# Patient Record
Sex: Female | Born: 1955 | Race: White | Hispanic: No | Smoking: Former smoker
Health system: Southern US, Community
[De-identification: ages and names within clinical notes are randomized; demographics above are authoritative.]

## PROBLEM LIST (undated history)

## (undated) DIAGNOSIS — J4520 Mild intermittent asthma, uncomplicated: Secondary | ICD-10-CM

## (undated) DIAGNOSIS — Z8639 Personal history of other endocrine, nutritional and metabolic disease: Secondary | ICD-10-CM

## (undated) DIAGNOSIS — E785 Hyperlipidemia, unspecified: Secondary | ICD-10-CM

## (undated) DIAGNOSIS — F329 Major depressive disorder, single episode, unspecified: Secondary | ICD-10-CM

## (undated) DIAGNOSIS — E669 Obesity, unspecified: Secondary | ICD-10-CM

## (undated) DIAGNOSIS — H269 Unspecified cataract: Secondary | ICD-10-CM

## (undated) DIAGNOSIS — Z973 Presence of spectacles and contact lenses: Secondary | ICD-10-CM

## (undated) DIAGNOSIS — D069 Carcinoma in situ of cervix, unspecified: Secondary | ICD-10-CM

## (undated) DIAGNOSIS — M1990 Unspecified osteoarthritis, unspecified site: Secondary | ICD-10-CM

## (undated) DIAGNOSIS — G4730 Sleep apnea, unspecified: Secondary | ICD-10-CM

## (undated) DIAGNOSIS — D509 Iron deficiency anemia, unspecified: Secondary | ICD-10-CM

## (undated) DIAGNOSIS — E119 Type 2 diabetes mellitus without complications: Secondary | ICD-10-CM

## (undated) DIAGNOSIS — I872 Venous insufficiency (chronic) (peripheral): Secondary | ICD-10-CM

## (undated) DIAGNOSIS — F32A Depression, unspecified: Secondary | ICD-10-CM

## (undated) DIAGNOSIS — G4733 Obstructive sleep apnea (adult) (pediatric): Secondary | ICD-10-CM

## (undated) HISTORY — DX: Hyperlipidemia, unspecified: E78.5

## (undated) HISTORY — DX: Type 2 diabetes mellitus without complications: E11.9

## (undated) HISTORY — DX: Venous insufficiency (chronic) (peripheral): I87.2

## (undated) HISTORY — DX: Depression, unspecified: F32.A

## (undated) HISTORY — DX: Sleep apnea, unspecified: G47.30

## (undated) HISTORY — DX: Unspecified osteoarthritis, unspecified site: M19.90

## (undated) HISTORY — DX: Unspecified cataract: H26.9

## (undated) HISTORY — DX: Obstructive sleep apnea (adult) (pediatric): G47.33

## (undated) HISTORY — PX: JOINT REPLACEMENT: SHX530

## (undated) HISTORY — PX: GASTRIC BYPASS: SHX52

## (undated) HISTORY — DX: Major depressive disorder, single episode, unspecified: F32.9

## (undated) HISTORY — DX: Obesity, unspecified: E66.9

---

## 1993-03-25 DIAGNOSIS — G4733 Obstructive sleep apnea (adult) (pediatric): Secondary | ICD-10-CM

## 1993-03-25 HISTORY — DX: Obstructive sleep apnea (adult) (pediatric): G47.33

## 1997-03-25 HISTORY — PX: TOTAL HIP ARTHROPLASTY: SHX124

## 1997-10-13 ENCOUNTER — Inpatient Hospital Stay (HOSPITAL_COMMUNITY): Admission: RE | Admit: 1997-10-13 | Discharge: 1997-10-18 | Payer: Self-pay | Admitting: Specialist

## 1997-10-20 ENCOUNTER — Encounter (HOSPITAL_COMMUNITY): Admission: RE | Admit: 1997-10-20 | Discharge: 1998-01-18 | Payer: Self-pay | Admitting: Specialist

## 1999-02-06 ENCOUNTER — Other Ambulatory Visit: Admission: RE | Admit: 1999-02-06 | Discharge: 1999-02-06 | Payer: Self-pay | Admitting: Family Medicine

## 1999-06-20 ENCOUNTER — Ambulatory Visit: Admission: RE | Admit: 1999-06-20 | Discharge: 1999-06-20 | Payer: Self-pay | Admitting: Internal Medicine

## 2000-02-13 ENCOUNTER — Emergency Department (HOSPITAL_COMMUNITY): Admission: EM | Admit: 2000-02-13 | Discharge: 2000-02-13 | Payer: Self-pay | Admitting: Emergency Medicine

## 2000-02-13 ENCOUNTER — Encounter: Payer: Self-pay | Admitting: Emergency Medicine

## 2001-04-10 ENCOUNTER — Other Ambulatory Visit: Admission: RE | Admit: 2001-04-10 | Discharge: 2001-04-10 | Payer: Self-pay | Admitting: Family Medicine

## 2003-04-27 ENCOUNTER — Other Ambulatory Visit: Admission: RE | Admit: 2003-04-27 | Discharge: 2003-04-27 | Payer: Self-pay | Admitting: Family Medicine

## 2003-05-02 ENCOUNTER — Encounter: Admission: RE | Admit: 2003-05-02 | Discharge: 2003-07-31 | Payer: Self-pay | Admitting: Family Medicine

## 2003-09-01 ENCOUNTER — Encounter: Admission: RE | Admit: 2003-09-01 | Discharge: 2003-11-30 | Payer: Self-pay | Admitting: Family Medicine

## 2004-07-31 ENCOUNTER — Ambulatory Visit: Payer: Self-pay | Admitting: Internal Medicine

## 2004-08-09 ENCOUNTER — Ambulatory Visit: Payer: Self-pay | Admitting: Family Medicine

## 2004-08-28 ENCOUNTER — Ambulatory Visit: Payer: Self-pay | Admitting: Internal Medicine

## 2004-11-09 ENCOUNTER — Ambulatory Visit: Payer: Self-pay | Admitting: Family Medicine

## 2004-11-09 ENCOUNTER — Other Ambulatory Visit: Admission: RE | Admit: 2004-11-09 | Discharge: 2004-11-09 | Payer: Self-pay | Admitting: Family Medicine

## 2005-01-07 ENCOUNTER — Ambulatory Visit: Payer: Self-pay | Admitting: Family Medicine

## 2005-04-12 ENCOUNTER — Ambulatory Visit: Payer: Self-pay | Admitting: Family Medicine

## 2005-05-24 ENCOUNTER — Ambulatory Visit: Payer: Self-pay | Admitting: Family Medicine

## 2005-06-14 ENCOUNTER — Encounter: Admission: RE | Admit: 2005-06-14 | Discharge: 2005-09-02 | Payer: Self-pay | Admitting: Surgery

## 2005-06-17 ENCOUNTER — Ambulatory Visit (HOSPITAL_COMMUNITY): Admission: RE | Admit: 2005-06-17 | Discharge: 2005-06-17 | Payer: Self-pay | Admitting: Surgery

## 2005-06-18 ENCOUNTER — Ambulatory Visit (HOSPITAL_COMMUNITY): Admission: RE | Admit: 2005-06-18 | Discharge: 2005-06-18 | Payer: Self-pay | Admitting: Surgery

## 2005-06-26 ENCOUNTER — Emergency Department (HOSPITAL_COMMUNITY): Admission: EM | Admit: 2005-06-26 | Discharge: 2005-06-26 | Payer: Self-pay | Admitting: Family Medicine

## 2005-09-27 ENCOUNTER — Encounter: Admission: RE | Admit: 2005-09-27 | Discharge: 2005-12-26 | Payer: Self-pay | Admitting: Surgery

## 2006-11-05 DIAGNOSIS — M1990 Unspecified osteoarthritis, unspecified site: Secondary | ICD-10-CM | POA: Insufficient documentation

## 2007-10-08 ENCOUNTER — Telehealth: Payer: Self-pay | Admitting: Family Medicine

## 2007-12-10 ENCOUNTER — Encounter: Payer: Self-pay | Admitting: Family Medicine

## 2007-12-22 ENCOUNTER — Ambulatory Visit: Payer: Self-pay | Admitting: Family Medicine

## 2007-12-22 LAB — CONVERTED CEMR LAB
Alkaline Phosphatase: 71 units/L (ref 39–117)
Basophils Absolute: 0 10*3/uL (ref 0.0–0.1)
Bilirubin Urine: NEGATIVE
Bilirubin, Direct: 0.1 mg/dL (ref 0.0–0.3)
Calcium: 8.8 mg/dL (ref 8.4–10.5)
Cholesterol: 238 mg/dL (ref 0–200)
Eosinophils Absolute: 0.2 10*3/uL (ref 0.0–0.7)
GFR calc Af Amer: 97 mL/min
GFR calc non Af Amer: 80 mL/min
Glucose, Urine, Semiquant: NEGATIVE
HCT: 38.4 % (ref 36.0–46.0)
Hemoglobin: 12.8 g/dL (ref 12.0–15.0)
MCHC: 33.4 g/dL (ref 30.0–36.0)
Microalb, Ur: 1.5 mg/dL (ref 0.0–1.9)
Monocytes Absolute: 0.4 10*3/uL (ref 0.1–1.0)
Monocytes Relative: 8.3 % (ref 3.0–12.0)
Neutro Abs: 3.1 10*3/uL (ref 1.4–7.7)
Platelets: 263 10*3/uL (ref 150–400)
Potassium: 4.1 meq/L (ref 3.5–5.1)
Protein, U semiquant: NEGATIVE
RDW: 14.9 % — ABNORMAL HIGH (ref 11.5–14.6)
Sodium: 138 meq/L (ref 135–145)
Total Bilirubin: 0.6 mg/dL (ref 0.3–1.2)
Total CHOL/HDL Ratio: 4.8
Total Protein: 6.4 g/dL (ref 6.0–8.3)
Triglycerides: 114 mg/dL (ref 0–149)
Urobilinogen, UA: 0.2
pH: 5.5

## 2007-12-29 ENCOUNTER — Encounter: Payer: Self-pay | Admitting: Family Medicine

## 2007-12-29 ENCOUNTER — Ambulatory Visit: Payer: Self-pay | Admitting: Family Medicine

## 2007-12-29 ENCOUNTER — Encounter: Payer: Self-pay | Admitting: Internal Medicine

## 2007-12-29 ENCOUNTER — Other Ambulatory Visit: Admission: RE | Admit: 2007-12-29 | Discharge: 2007-12-29 | Payer: Self-pay | Admitting: Family Medicine

## 2007-12-29 DIAGNOSIS — F329 Major depressive disorder, single episode, unspecified: Secondary | ICD-10-CM

## 2007-12-29 DIAGNOSIS — E1110 Type 2 diabetes mellitus with ketoacidosis without coma: Secondary | ICD-10-CM

## 2007-12-29 DIAGNOSIS — E785 Hyperlipidemia, unspecified: Secondary | ICD-10-CM

## 2008-01-12 ENCOUNTER — Ambulatory Visit: Payer: Self-pay | Admitting: Internal Medicine

## 2008-01-19 ENCOUNTER — Ambulatory Visit (HOSPITAL_COMMUNITY): Admission: RE | Admit: 2008-01-19 | Discharge: 2008-01-19 | Payer: Self-pay | Admitting: Surgery

## 2008-02-01 ENCOUNTER — Ambulatory Visit: Payer: Self-pay | Admitting: Family Medicine

## 2008-02-02 ENCOUNTER — Encounter: Admission: RE | Admit: 2008-02-02 | Discharge: 2008-05-02 | Payer: Self-pay | Admitting: Surgery

## 2008-02-08 ENCOUNTER — Telehealth: Payer: Self-pay | Admitting: Family Medicine

## 2008-02-23 ENCOUNTER — Ambulatory Visit: Payer: Self-pay | Admitting: Family Medicine

## 2008-02-23 LAB — CONVERTED CEMR LAB
AST: 20 units/L (ref 0–37)
Albumin: 3.4 g/dL — ABNORMAL LOW (ref 3.5–5.2)
HDL: 41.6 mg/dL (ref >39.0)
LDL Cholesterol: 98 mg/dL (ref 0–99)
Total CHOL/HDL Ratio: 3.8
Total Protein: 6.2 g/dL (ref 6.0–8.3)
Triglycerides: 103 mg/dL (ref 0–149)

## 2008-03-07 ENCOUNTER — Ambulatory Visit: Payer: Self-pay | Admitting: Family Medicine

## 2008-03-07 DIAGNOSIS — I872 Venous insufficiency (chronic) (peripheral): Secondary | ICD-10-CM

## 2008-03-07 LAB — CONVERTED CEMR LAB
CO2: 28 meq/L (ref 19–32)
Calcium: 9.7 mg/dL (ref 8.4–10.5)
Chloride: 108 meq/L (ref 96–112)
GFR calc non Af Amer: 93 mL/min
Sodium: 143 meq/L (ref 135–145)

## 2008-03-11 ENCOUNTER — Telehealth: Payer: Self-pay | Admitting: *Deleted

## 2008-03-25 HISTORY — PX: GASTRIC BYPASS: SHX52

## 2008-04-11 ENCOUNTER — Telehealth: Payer: Self-pay | Admitting: Family Medicine

## 2008-04-20 ENCOUNTER — Ambulatory Visit: Payer: Self-pay | Admitting: Family Medicine

## 2008-04-20 DIAGNOSIS — J45909 Unspecified asthma, uncomplicated: Secondary | ICD-10-CM | POA: Insufficient documentation

## 2008-04-20 DIAGNOSIS — J4520 Mild intermittent asthma, uncomplicated: Secondary | ICD-10-CM | POA: Insufficient documentation

## 2008-04-20 DIAGNOSIS — J069 Acute upper respiratory infection, unspecified: Secondary | ICD-10-CM | POA: Insufficient documentation

## 2008-07-28 ENCOUNTER — Ambulatory Visit: Payer: Self-pay | Admitting: Family Medicine

## 2008-07-28 ENCOUNTER — Encounter: Admission: RE | Admit: 2008-07-28 | Discharge: 2008-10-26 | Payer: Self-pay | Admitting: Surgery

## 2008-08-11 ENCOUNTER — Telehealth: Payer: Self-pay | Admitting: Family Medicine

## 2008-08-15 ENCOUNTER — Inpatient Hospital Stay (HOSPITAL_COMMUNITY): Admission: RE | Admit: 2008-08-15 | Discharge: 2008-08-17 | Payer: Self-pay | Admitting: Surgery

## 2008-08-15 HISTORY — PX: ROUX-EN-Y GASTRIC BYPASS: SHX1104

## 2008-08-16 ENCOUNTER — Ambulatory Visit: Payer: Self-pay | Admitting: Vascular Surgery

## 2008-08-16 ENCOUNTER — Encounter (INDEPENDENT_AMBULATORY_CARE_PROVIDER_SITE_OTHER): Payer: Self-pay | Admitting: Surgery

## 2008-09-16 ENCOUNTER — Ambulatory Visit: Payer: Self-pay | Admitting: Family Medicine

## 2008-10-24 ENCOUNTER — Emergency Department (HOSPITAL_COMMUNITY): Admission: EM | Admit: 2008-10-24 | Discharge: 2008-10-24 | Payer: Self-pay | Admitting: Emergency Medicine

## 2008-11-17 ENCOUNTER — Encounter: Admission: RE | Admit: 2008-11-17 | Discharge: 2009-01-26 | Payer: Self-pay | Admitting: Surgery

## 2008-12-13 ENCOUNTER — Ambulatory Visit: Payer: Self-pay | Admitting: Family Medicine

## 2008-12-13 LAB — CONVERTED CEMR LAB
Basophils Absolute: 0 10*3/uL (ref 0.0–0.1)
Eosinophils Absolute: 0.2 10*3/uL (ref 0.0–0.7)
GFR calc non Af Amer: 93.01 mL/min (ref >60)
Glucose, Bld: 101 mg/dL — ABNORMAL HIGH (ref 70–99)
HCT: 38.7 % (ref 36.0–46.0)
HDL: 39.3 mg/dL (ref >39.00)
Hemoglobin: 13 g/dL (ref 12.0–15.0)
Hgb A1c MFr Bld: 5.7 % (ref 4.6–6.5)
Lymphs Abs: 1.4 10*3/uL (ref 0.7–4.0)
MCHC: 33.7 g/dL (ref 30.0–36.0)
MCV: 92 fL (ref 78.0–100.0)
Monocytes Absolute: 0.4 10*3/uL (ref 0.1–1.0)
Neutro Abs: 2.3 10*3/uL (ref 1.4–7.7)
Platelets: 207 10*3/uL (ref 150.0–400.0)
Potassium: 3.7 meq/L (ref 3.5–5.1)
RDW: 15.8 % — ABNORMAL HIGH (ref 11.5–14.6)
Sodium: 143 meq/L (ref 135–145)
TSH: 1.67 microintl units/mL (ref 0.35–5.50)
Triglycerides: 57 mg/dL (ref 0.0–149.0)
VLDL: 11.4 mg/dL (ref 0.0–40.0)

## 2008-12-20 ENCOUNTER — Ambulatory Visit: Payer: Self-pay | Admitting: Family Medicine

## 2009-01-02 ENCOUNTER — Ambulatory Visit: Payer: Self-pay | Admitting: Family Medicine

## 2009-01-02 ENCOUNTER — Other Ambulatory Visit: Admission: RE | Admit: 2009-01-02 | Discharge: 2009-01-02 | Payer: Self-pay | Admitting: Family Medicine

## 2009-01-02 ENCOUNTER — Encounter: Payer: Self-pay | Admitting: Family Medicine

## 2009-01-02 LAB — HM DIABETES FOOT EXAM

## 2009-01-24 ENCOUNTER — Telehealth: Payer: Self-pay | Admitting: Family Medicine

## 2009-01-26 ENCOUNTER — Ambulatory Visit: Payer: Self-pay | Admitting: Family Medicine

## 2009-02-28 ENCOUNTER — Telehealth: Payer: Self-pay | Admitting: Family Medicine

## 2009-03-13 ENCOUNTER — Telehealth: Payer: Self-pay | Admitting: Family Medicine

## 2009-04-17 ENCOUNTER — Ambulatory Visit: Payer: Self-pay | Admitting: Family Medicine

## 2009-06-06 ENCOUNTER — Telehealth: Payer: Self-pay | Admitting: Family Medicine

## 2009-07-10 ENCOUNTER — Telehealth: Payer: Self-pay | Admitting: Family Medicine

## 2009-07-31 ENCOUNTER — Telehealth: Payer: Self-pay | Admitting: Family Medicine

## 2009-07-31 DIAGNOSIS — R32 Unspecified urinary incontinence: Secondary | ICD-10-CM | POA: Insufficient documentation

## 2010-04-15 ENCOUNTER — Encounter: Payer: Self-pay | Admitting: Family Medicine

## 2010-04-24 NOTE — Progress Notes (Signed)
Summary: wants referral  Phone Note Call from Patient Call back at Home Phone [phone]   Caller: Patient---LIVE CALL Summary of Call: Still having unending periods. Please refer. Initial call taken by: Warnell Forester,  July 10, 2009 9:50 AM  Follow-up for Phone Call        [facility] OB[facility]GY[facility] doctor Kaplan/WEin she can call and make her own appointment just tell them that I referred her so she can get in right away Follow-up by: Roderick Pee MD,  July 10, 2009 10:55 AM  Additional Follow-up for Phone Call Additional follow up Details #1::        patient is aware Additional Follow-up by: Kern Reap CMA Duncan Dull),  July 10, 2009 4:03 PM

## 2010-04-24 NOTE — Progress Notes (Signed)
Summary: wants to speak with Fleet Contras  Phone Note Call from Patient Call back at [facility] Phone [phone]   Caller: Patient---LIVE CALL Reason for Call: Talk to Nurse Summary of Call: Pt is having an issue. She would like for Fleet Contras to return her call. No further message was given. Initial call taken by: Warnell Forester,  June 06, 2009 10:59 AM  Follow-up for Phone Call        pt is having some break-through bleeding started february 21.  she then had a normal period and still having some spotting now.  pt is taking a depo injection.  she had no problem the first 3 moths after the first depo injection.  any suggestions? Follow-up by: Kern Reap CMA Duncan Dull),  June 06, 2009 1:14 PM  Additional Follow-up for Phone Call Additional follow up Details #1::        call-in Zovia 1/35/21......... one pill twice a day for a day or two for any spotting and/or bleeding.  If it persists after this, then, we will need a GYN consult Additional Follow-up by: Roderick Pee MD,  June 06, 2009 1:33 PM    New/Updated Medications: ZOVIA 1/35E (28) 1-35 MG-MCG TABS (ETHYNODIOL DIAC-ETH ESTRADIOL) take one tab once daily Prescriptions: ZOVIA 1/35E (28) 1-35 MG-MCG TABS (ETHYNODIOL DIAC-ETH ESTRADIOL) take one tab once daily  #30 x 0   Entered by:   Kern Reap CMA (AAMA)   Authorized by:   Roderick Pee MD   Signed by:   Kern Reap CMA (AAMA) on 06/06/2009   Method used:   [facility]        [facility] [phone][facility] (retail)       [address]       [place], [place]  [address]       Ph: [phone]       Fax: [phone]   RxID:   [id number]

## 2010-04-24 NOTE — Assessment & Plan Note (Signed)
Summary: DEPO INJ//SLM   Nurse Visit   Allergies: No Known Drug Allergies  Medication Administration  Injection # 1:    Medication: Depo-Provera 150mg     Diagnosis: OTH GENERAL CNSL&ADVICE CONTRACEPT MANAGEMENT (ICD-V25.09)    Route: IM    Site: LUOQ gluteus    Exp Date: 07/24/2011    Lot #: [id number]    Mfr: GREENSTONE    Given by: Lynann Beaver CMA (April 17, 2009 1:42 PM)  Orders Added: 1)  Depo-Provera 150mg  [J1055] 2)  Admin of Therapeutic Inj  intramuscular or subcutaneous [04540]

## 2010-04-24 NOTE — Progress Notes (Signed)
Summary: incontinence concerns  Phone Note Call from Patient Call back at Home Phone [phone]   Caller: Patient--live call Summary of Call: Have some issues with incontinence. Please advise. Wants Fleet Contras to return her call. Initial call taken by: Warnell Forester,  Jul 31, 2009 11:29 AM  Follow-up for Phone Call        patient is calling because her incontinence has increased.  is there a rx she can try, come in for an office visit, or see an urologist? Follow-up by: Kern Reap CMA Duncan Dull),  Jul 31, 2009 11:45 AM  Additional Follow-up for Phone Call Additional follow up Details #1::        urologic consult Additional Follow-up by: Roderick Pee MD,  Jul 31, 2009 12:09 PM  New Problems: INCONTINENCE (ICD-788.30)   Additional Follow-up for Phone Call Additional follow up Details #2::    patient aware Follow-up by: Kern Reap CMA Duncan Dull),  Jul 31, 2009 12:47 PM  New Problems: INCONTINENCE (ICD-788.30)

## 2010-05-12 ENCOUNTER — Other Ambulatory Visit: Payer: Self-pay | Admitting: Family Medicine

## 2010-05-14 ENCOUNTER — Telehealth: Payer: Self-pay | Admitting: Family Medicine

## 2010-05-14 NOTE — Telephone Encounter (Signed)
Rx refill of bupropion HCL 100mg , lexapro 20mg  and simvastatin (dosage??) [facility] [place] [phone]

## 2010-05-15 ENCOUNTER — Telehealth: Payer: Self-pay | Admitting: Family Medicine

## 2010-05-15 DIAGNOSIS — E785 Hyperlipidemia, unspecified: Secondary | ICD-10-CM

## 2010-05-15 DIAGNOSIS — F329 Major depressive disorder, single episode, unspecified: Secondary | ICD-10-CM

## 2010-05-15 MED ORDER — ESCITALOPRAM OXALATE 20 MG PO TABS: 20.0000 mg | ORAL_TABLET | Freq: Every day | ORAL | Status: DC

## 2010-05-15 MED ORDER — SIMVASTATIN 40 MG PO TABS: 40.0000 mg | ORAL_TABLET | Freq: Every day | ORAL | Status: DC

## 2010-05-15 MED ORDER — BUPROPION HCL 100 MG PO TABS: 100.0000 mg | ORAL_TABLET | Freq: Three times a day (TID) | ORAL | Status: DC

## 2010-05-15 NOTE — Telephone Encounter (Signed)
Would like to speak Jacqueline Hawkins about her rxs. She would like a call back before 5 pm today.

## 2010-06-13 ENCOUNTER — Telehealth: Payer: Self-pay | Admitting: Family Medicine

## 2010-06-13 DIAGNOSIS — F329 Major depressive disorder, single episode, unspecified: Secondary | ICD-10-CM

## 2010-06-13 MED ORDER — ESCITALOPRAM OXALATE 20 MG PO TABS: 20.0000 mg | ORAL_TABLET | Freq: Every day | ORAL | Status: DC

## 2010-06-13 MED ORDER — BUPROPION HCL 100 MG PO TABS: 100.0000 mg | ORAL_TABLET | Freq: Three times a day (TID) | ORAL | Status: DC

## 2010-06-13 NOTE — Telephone Encounter (Signed)
Pt req refill of Lexapro (unknown dose) and med generic Wellbutrin. [facility] [place]. Pt will run out of med before ov next wk. Pt req this be called in today.

## 2010-06-14 ENCOUNTER — Encounter: Payer: Self-pay | Admitting: Family Medicine

## 2010-06-15 ENCOUNTER — Encounter: Payer: Self-pay | Admitting: Family Medicine

## 2010-06-18 ENCOUNTER — Ambulatory Visit (INDEPENDENT_AMBULATORY_CARE_PROVIDER_SITE_OTHER): Payer: Managed Care, Other (non HMO) | Admitting: Family Medicine

## 2010-06-18 ENCOUNTER — Encounter: Payer: Self-pay | Admitting: Family Medicine

## 2010-06-18 DIAGNOSIS — E785 Hyperlipidemia, unspecified: Secondary | ICD-10-CM

## 2010-06-18 DIAGNOSIS — N938 Other specified abnormal uterine and vaginal bleeding: Secondary | ICD-10-CM | POA: Insufficient documentation

## 2010-06-18 DIAGNOSIS — N949 Unspecified condition associated with female genital organs and menstrual cycle: Secondary | ICD-10-CM

## 2010-06-18 DIAGNOSIS — Z1211 Encounter for screening for malignant neoplasm of colon: Secondary | ICD-10-CM

## 2010-06-18 DIAGNOSIS — F329 Major depressive disorder, single episode, unspecified: Secondary | ICD-10-CM

## 2010-06-18 LAB — HEPATIC FUNCTION PANEL
ALT: 22 U/L (ref 0–35)
AST: 25 U/L (ref 0–37)
Bilirubin, Direct: 0.1 mg/dL (ref 0.0–0.3)
Total Bilirubin: 0.4 mg/dL (ref 0.3–1.2)
Total Protein: 6.4 g/dL (ref 6.0–8.3)

## 2010-06-18 LAB — CBC WITH DIFFERENTIAL/PLATELET
Basophils Relative: 0.5 % (ref 0.0–3.0)
Eosinophils Relative: 2.2 % (ref 0.0–5.0)
HCT: 38.4 % (ref 36.0–46.0)
Lymphs Abs: 2 10*3/uL (ref 0.7–4.0)
MCV: 95.5 fl (ref 78.0–100.0)
Monocytes Absolute: 0.3 10*3/uL (ref 0.1–1.0)
Monocytes Relative: 5.8 % (ref 3.0–12.0)
Neutrophils Relative %: 57.7 % (ref 43.0–77.0)
Platelets: 255 10*3/uL (ref 150.0–400.0)
RBC: 4.02 Mil/uL (ref 3.87–5.11)
WBC: 5.9 10*3/uL (ref 4.5–10.5)

## 2010-06-18 LAB — TSH: TSH: 2.69 u[IU]/mL (ref 0.35–5.50)

## 2010-06-18 LAB — LIPID PANEL
LDL Cholesterol: 86 mg/dL (ref 0–99)
Total CHOL/HDL Ratio: 3
Triglycerides: 73 mg/dL (ref 0.0–149.0)

## 2010-06-18 LAB — POCT URINALYSIS DIPSTICK
Bilirubin, UA: NEGATIVE
Glucose, UA: NEGATIVE
Nitrite, UA: NEGATIVE
Urobilinogen, UA: 0.2

## 2010-06-18 LAB — VITAMIN B12: Vitamin B-12: 493 pg/mL (ref 211–911)

## 2010-06-18 LAB — HEMOGLOBIN A1C: Hgb A1c MFr Bld: 5.6 % (ref 4.6–6.5)

## 2010-06-18 LAB — BASIC METABOLIC PANEL
Chloride: 103 mEq/L (ref 96–112)
GFR: 64.23 mL/min (ref >60.00)
Potassium: 4.2 mEq/L (ref 3.5–5.1)

## 2010-06-18 MED ORDER — ESCITALOPRAM OXALATE 20 MG PO TABS: 20.0000 mg | ORAL_TABLET | Freq: Every day | ORAL | Status: DC

## 2010-06-18 MED ORDER — BUPROPION HCL 100 MG PO TABS: 100.0000 mg | ORAL_TABLET | Freq: Three times a day (TID) | ORAL | Status: DC

## 2010-06-18 MED ORDER — SIMVASTATIN 40 MG PO TABS: 40.0000 mg | ORAL_TABLET | Freq: Every day | ORAL | Status: DC

## 2010-06-18 NOTE — Progress Notes (Signed)
  Subjective:    Patient ID: Jacqueline Hawkins, female    DOB: Dec 15, 1955, 55 y.o.   MRN: [id number]  Jacqueline Hawkins it is a delightful, 55 year old, married female, nonsmoker, who comes in today for general medical examination because of a history of underlying depression dysfunctional uterine bleeding.  Hyperlipidemia, obesity, and diabetes.  May 2010 she underwent a gastric procedure for weight loss.  At that time she weighed 340 pounds.  Now.  She is down to 196 pounds.  Blood sugar is normal.  Diabetic eye exam shows no retinopathy.  She is not on any medication for her blood sugar.  She takes Robitussin 100 mg 3 times a day, and Lexapro 20 mg nightly for mild depression.  She also takes BCPs 135 encouraged to stop the BCPs since she is 55.  She takes simvastatin 20 mg nightly for hyperlipidemia.      Review of Systems  Constitutional: Negative.   HENT: Negative.   Eyes: Negative.   Respiratory: Negative.   Cardiovascular: Negative.   Gastrointestinal: Negative.   Genitourinary: Negative.   Musculoskeletal: Negative.   Neurological: Negative.   Hematological: Negative.   Psychiatric/Behavioral: Negative.        Objective:   Physical Exam  Constitutional: She appears well-developed and well-nourished.  HENT:  Head: Normocephalic and atraumatic.  Right Ear: External ear normal.  Left Ear: External ear normal.  Nose: Nose normal.  Mouth/Throat: Oropharynx is clear and moist.  Eyes: EOM are normal. Pupils are equal, round, and reactive to light.  Neck: Normal range of motion. Neck supple. No thyromegaly present.  Cardiovascular: Normal rate, regular rhythm, normal heart sounds and intact distal pulses.  Exam reveals no gallop and no friction rub.   No murmur heard. Pulmonary/Chest: Effort normal and breath sounds normal.  Abdominal: Soft. Bowel sounds are normal. She exhibits no distension and no mass. There is no tenderness. There is no rebound.  Musculoskeletal: Normal range  of motion.  Lymphadenopathy:    She has no cervical adenopathy.  Neurological: She is alert. She has normal reflexes. No cranial nerve deficit. She exhibits normal muscle tone. Coordination normal.  Skin: Skin is warm and dry.  Psychiatric: She has a normal mood and affect. Her behavior is normal. Judgment and thought content normal.          Assessment & Plan:  Morbid obesity, weight loss from 340 to 196 pounds.  Mild depression.  Continue medication.  Dysfunction uterine bleeding stopped BCPs.  Hyperlipidemia.  Continue Zocor 20 nightly

## 2010-06-18 NOTE — Patient Instructions (Signed)
Check to see if the 300 mg long-acting once a day, Wellbutrin tablet would be acceptable.  Continue Lexapro and is simvastatin.  Stop the BCPs however if your  periods come back every heavy, heavy, then call and will discuss plan B.  Return in one year, sooner if any problems

## 2010-06-20 ENCOUNTER — Telehealth: Payer: Self-pay | Admitting: Family Medicine

## 2010-06-20 NOTE — Telephone Encounter (Signed)
Pt called and said that fax did not go through. Pls resend asap. fa

## 2010-06-20 NOTE — Telephone Encounter (Signed)
re-faxed

## 2010-06-20 NOTE — Telephone Encounter (Signed)
Pts fax # is [phone]. Pls refax. Original fax did not go through.

## 2010-07-03 LAB — DIFFERENTIAL
Basophils Absolute: 0 10*3/uL (ref 0.0–0.1)
Basophils Absolute: 0 10*3/uL (ref 0.0–0.1)
Basophils Relative: 0 % (ref 0–1)
Eosinophils Absolute: 0 10*3/uL (ref 0.0–0.7)
Eosinophils Absolute: 0.1 10*3/uL (ref 0.0–0.7)
Eosinophils Relative: 3 % (ref 0–5)
Lymphocytes Relative: 29 % (ref 12–46)
Monocytes Absolute: 0.3 10*3/uL (ref 0.1–1.0)
Monocytes Relative: 8 % (ref 3–12)
Neutro Abs: 5.7 10*3/uL (ref 1.7–7.7)
Neutrophils Relative %: 70 % (ref 43–77)

## 2010-07-03 LAB — HEMOGLOBIN AND HEMATOCRIT, BLOOD
HCT: 36.6 % (ref 36.0–46.0)
HCT: 38.1 % (ref 36.0–46.0)
Hemoglobin: 12.7 g/dL (ref 12.0–15.0)

## 2010-07-03 LAB — GLUCOSE, CAPILLARY
Glucose-Capillary: 122 mg/dL — ABNORMAL HIGH (ref 70–99)
Glucose-Capillary: 135 mg/dL — ABNORMAL HIGH (ref 70–99)
Glucose-Capillary: 136 mg/dL — ABNORMAL HIGH (ref 70–99)
Glucose-Capillary: 137 mg/dL — ABNORMAL HIGH (ref 70–99)
Glucose-Capillary: 150 mg/dL — ABNORMAL HIGH (ref 70–99)
Glucose-Capillary: 194 mg/dL — ABNORMAL HIGH (ref 70–99)

## 2010-07-03 LAB — CBC
HCT: 33.7 % — ABNORMAL LOW (ref 36.0–46.0)
Hemoglobin: 11.4 g/dL — ABNORMAL LOW (ref 12.0–15.0)
MCHC: 33.4 g/dL (ref 30.0–36.0)
MCV: 91.8 fL (ref 78.0–100.0)
MCV: 92.2 fL (ref 78.0–100.0)
Platelets: 245 10*3/uL (ref 150–400)
Platelets: 258 10*3/uL (ref 150–400)
RBC: 4.11 MIL/uL (ref 3.87–5.11)
RBC: 4.38 MIL/uL (ref 3.87–5.11)
RDW: 15.3 % (ref 11.5–15.5)
RDW: 15.6 % — ABNORMAL HIGH (ref 11.5–15.5)
WBC: 5 10*3/uL (ref 4.0–10.5)

## 2010-07-03 LAB — COMPREHENSIVE METABOLIC PANEL
ALT: 14 U/L (ref 0–35)
AST: 16 U/L (ref 0–37)
Albumin: 3.6 g/dL (ref 3.5–5.2)
CO2: 29 mEq/L (ref 19–32)
Chloride: 104 mEq/L (ref 96–112)
Creatinine, Ser: 0.7 mg/dL (ref 0.4–1.2)
GFR calc Af Amer: >60 mL/min (ref >60)
Potassium: 4.3 mEq/L (ref 3.5–5.1)
Sodium: 143 mEq/L (ref 135–145)
Total Bilirubin: 0.6 mg/dL (ref 0.3–1.2)

## 2010-07-03 LAB — PREGNANCY, URINE: Preg Test, Ur: NEGATIVE

## 2010-08-07 NOTE — Op Note (Signed)
NAMEBRYNLEA, SPINDLER                ACCOUNT NO.:  [id number]   MEDICAL RECORD NO.:  [id number]          PATIENT TYPE:  INP   LOCATION:  0001                         FACILITY:  [facility]   PHYSICIAN:  Thornton Park. Daphine Deutscher, MD  DATE OF BIRTH:  Feb 23, 1956   DATE OF PROCEDURE:  08/15/2008  DATE OF DISCHARGE:                               OPERATIVE REPORT   PREOPERATIVE DIAGNOSIS:  Morbid obesity BMI of 53.   POSTOPERATIVE DIAGNOSIS:  Morbid obesity BMI of 53.   PROCEDURE:  Laparoscopic Roux-en-Y gastric bypass with a 100 cm Roux  limb, 40 cm BP limb, anticolic, antegastric candy cane left, closure  Peterson's defect.  Upper endoscopy.   DESCRIPTION OF PROCEDURE:  This 55 year old lady was taken to room 1 and  given general anesthesia.  The abdomen was prepped with a chlorhexidine  equivalent and draped sterilely.  Access to the abdomen was achieved  through the left upper quadrant using OptiVu without difficulty.  A 12  mm device and standard ports were placed.  The omentum was elevated and  the ligament of Treitz was easily identified.  I measured 40 cm  downstream whereupon I divided the bowel.  A Penrose was sutured to the  Roux limb end and then I measured the full meter further bypass.  The BP  limb was sutured side-to-side to the Roux limb and then the common  defect was achieved with 60 mm white cartridge Covidien stapler.  The  common defect was closed from either with a 2-0 Vicryls and Tisseel.  The mesenteric defect was closed with running 2-0 silk.   The omentum was then divided.   Next, we went up and put the [facility] retractor in and I measured 5 cm  down on the lesser curvature and marked a spot for the division of the  stomach.  Everything was taken out of the stomach and sequential  applications of the Endo GIA with first blue cartridges and the final  two applications were with the Duet as I transected the cardia.   The Roux limb was then brought up and sutured along the  staple line.  This was done with 2-0 Vicryl with Laparoties and then the anastomosis  was created by creating a gastrotomy and jejunotomy along the right side  and then entering with the Endo-GIA 4.5 cm device blue cartridge firing  creating a common channel.  This opening was closed from either end with  a 2-0 Vicryl.  The second layer of Vicryl was used anteriorly.   I then clamped off the outflow and I went up and endoscoped the patient.  She has about a 5 cm pouch small and with distention there was no  bubbles noted.  Everything looked good.  No bleeding was noted.  We then  withdrew the irrigant and applied some Tisseel.  I also closed  Peterson's by suturing the mesentery of the Roux down to the transverse  mesocolon of the colon.   Abdomen was deflated.  Wounds were closed 4-0 Vicryl, Benzoin and Steri-  Strips.  The patient tolerated procedure well.  Thornton Park Daphine Deutscher, MD  Electronically Signed     MBM/MEDQ  D:  08/15/2008  T:  08/15/2008  Job:  [id number]   cc:   Tinnie Gens A. Tawanna Cooler, MD  [address] [place]  [place] [address]

## 2010-08-07 NOTE — Discharge Summary (Signed)
[name], [name]                ACCOUNT NO.:  [id number]   MEDICAL RECORD NO.:  [id number]          PATIENT TYPE:  INP   LOCATION:  1529                         FACILITY:  [facility]   PHYSICIAN:  Thornton Park. Daphine Deutscher, MD  DATE OF BIRTH:  19-Apr-1955   DATE OF ADMISSION:  08/15/2008  DATE OF DISCHARGE:  08/17/2008                               DISCHARGE SUMMARY   DIAGNOSIS:  Medically refractory morbid obesity.   PROCEDURE:  Laparoscopic Roux-en-Y gastric bypass with a 40-cm BP limb,  100 cm Roux limb with endoscopy on Aug 15, 2008.   HOSPITAL COURSE:  Jacqueline Hawkins is a 55 year old lady who underwent the above-  mentioned operation.  She did well postoperatively.  She had a normal  DVT study and swallow showed no leak.  No obvious obstruction.  She was  begun on liquids and  advanced.  She was ready for discharge on May 26  at which time she was given Roxicet elixir for pain and return to the  office for follow-up within 2 weeks.  Condition improved      Molli Hazard B. Daphine Deutscher, MD  Electronically Signed     MBM/MEDQ  D:  09/11/2008  T:  09/12/2008  Job:  [id number]

## 2010-08-21 ENCOUNTER — Encounter: Payer: Self-pay | Admitting: Gastroenterology

## 2011-01-29 ENCOUNTER — Telehealth (INDEPENDENT_AMBULATORY_CARE_PROVIDER_SITE_OTHER): Payer: Self-pay | Admitting: Surgery

## 2011-01-29 NOTE — Telephone Encounter (Signed)
01/29/11 mailed recall notice to patient for bariatric surgery follow-up. Adv pt to call [facility] to schedule an appt. cef °

## 2011-02-08 ENCOUNTER — Other Ambulatory Visit (INDEPENDENT_AMBULATORY_CARE_PROVIDER_SITE_OTHER): Payer: Self-pay | Admitting: General Surgery

## 2011-02-08 ENCOUNTER — Telehealth (INDEPENDENT_AMBULATORY_CARE_PROVIDER_SITE_OTHER): Payer: Self-pay | Admitting: General Surgery

## 2011-02-08 DIAGNOSIS — K912 Postsurgical malabsorption, not elsewhere classified: Secondary | ICD-10-CM

## 2011-02-08 DIAGNOSIS — Z09 Encounter for follow-up examination after completed treatment for conditions other than malignant neoplasm: Secondary | ICD-10-CM

## 2011-02-08 NOTE — Telephone Encounter (Signed)
The patient called the office and scheduled her 1 year follow up. Labs put in system and will be obtained prior to her appt on 03/22/11. Mailed lab encounter to patient, she will have them drawn at [facility].

## 2011-03-13 ENCOUNTER — Telehealth: Payer: Self-pay | Admitting: *Deleted

## 2011-03-13 NOTE — Telephone Encounter (Signed)
Pt has a flexible spending acct and has $200 left on it. She is wondering if we could write her some rx's for otc pain relief medication etc. Pt wants to go over the list with Fleet Contras.

## 2011-03-14 NOTE — Telephone Encounter (Signed)
ok 

## 2011-03-14 NOTE — Telephone Encounter (Signed)
Pt called and said that she needs the following otc meds, zantac 150mg , Sudafed, Dayquil or Nyquil or Mucinex for cold and flu, acetametaphine ie Tylenol.

## 2011-03-14 NOTE — Telephone Encounter (Signed)
Left message on machine returning patient's call 

## 2011-03-15 ENCOUNTER — Other Ambulatory Visit (INDEPENDENT_AMBULATORY_CARE_PROVIDER_SITE_OTHER): Payer: Self-pay | Admitting: Surgery

## 2011-03-15 MED ORDER — PSEUDOEPHEDRINE HCL 60 MG PO TABS: 60.0000 mg | ORAL_TABLET | ORAL | Status: AC | PRN

## 2011-03-15 MED ORDER — DM-GUAIFENESIN ER 30-600 MG PO TB12: 1.0000 | ORAL_TABLET | Freq: Two times a day (BID) | ORAL | Status: AC

## 2011-03-15 MED ORDER — ONE-DAILY MULTI VITAMINS PO TABS: 1.0000 | ORAL_TABLET | Freq: Every day | ORAL | Status: DC

## 2011-03-15 MED ORDER — ACETAMINOPHEN 500 MG PO TABS: 500.0000 mg | ORAL_TABLET | Freq: Four times a day (QID) | ORAL | Status: AC | PRN

## 2011-03-15 MED ORDER — RANITIDINE HCL 150 MG PO CAPS: 150.0000 mg | ORAL_CAPSULE | Freq: Two times a day (BID) | ORAL | Status: DC

## 2011-03-15 MED ORDER — ASPIRIN 81 MG PO TABS: 81.0000 mg | ORAL_TABLET | Freq: Every day | ORAL | Status: AC

## 2011-03-15 MED ORDER — IBUPROFEN 200 MG PO TABS: 200.0000 mg | ORAL_TABLET | Freq: Four times a day (QID) | ORAL | Status: AC | PRN

## 2011-03-16 LAB — CBC WITH DIFFERENTIAL/PLATELET
Basophils Relative: 0 % (ref 0–1)
HCT: 44.2 % (ref 36.0–46.0)
Hemoglobin: 14.4 g/dL (ref 12.0–15.0)
Lymphocytes Relative: 31 % (ref 12–46)
Lymphs Abs: 1.8 10*3/uL (ref 0.7–4.0)
Monocytes Absolute: 0.5 10*3/uL (ref 0.1–1.0)
Monocytes Relative: 8 % (ref 3–12)
Neutro Abs: 3.4 10*3/uL (ref 1.7–7.7)
Neutrophils Relative %: 59 % (ref 43–77)
RBC: 4.58 MIL/uL (ref 3.87–5.11)
WBC: 5.8 10*3/uL (ref 4.0–10.5)

## 2011-03-16 LAB — COMPREHENSIVE METABOLIC PANEL
Albumin: 4.1 g/dL (ref 3.5–5.2)
Alkaline Phosphatase: 67 U/L (ref 39–117)
BUN: 17 mg/dL (ref 6–23)
Calcium: 9.4 mg/dL (ref 8.4–10.5)
Chloride: 103 mEq/L (ref 96–112)
Glucose, Bld: 77 mg/dL (ref 70–99)
Potassium: 4.3 mEq/L (ref 3.5–5.3)
Sodium: 139 mEq/L (ref 135–145)
Total Protein: 6.8 g/dL (ref 6.0–8.3)

## 2011-03-16 LAB — FOLATE: Folate: >20 ng/mL

## 2011-03-16 LAB — LIPID PANEL
HDL: 58 mg/dL (ref >39)
LDL Cholesterol: 100 mg/dL — ABNORMAL HIGH (ref 0–99)
Triglycerides: 75 mg/dL (ref <150)

## 2011-03-16 LAB — IRON AND TIBC
%SAT: 30 % (ref 20–55)
TIBC: 410 ug/dL (ref 250–470)

## 2011-03-16 LAB — VITAMIN B12: Vitamin B-12: 970 pg/mL — ABNORMAL HIGH (ref 211–911)

## 2011-03-22 ENCOUNTER — Ambulatory Visit (INDEPENDENT_AMBULATORY_CARE_PROVIDER_SITE_OTHER): Payer: Managed Care, Other (non HMO) | Admitting: Surgery

## 2011-03-22 VITALS — BP 116/74

## 2011-03-22 DIAGNOSIS — F329 Major depressive disorder, single episode, unspecified: Secondary | ICD-10-CM

## 2011-03-22 DIAGNOSIS — Z9884 Bariatric surgery status: Secondary | ICD-10-CM

## 2011-03-22 LAB — VITAMIN B1: Vitamin B1 (Thiamine): 50 nmol/L — ABNORMAL HIGH (ref 8–30)

## 2011-03-22 NOTE — Progress Notes (Signed)
Jacqueline Hawkins 55 y.o.  There is no height or weight on file to calculate BMI.  Patient Active Problem List  Diagnoses  . DIABETES MELLITUS, TYPE II  . HYPERLIPIDEMIA  . MORBID OBESITY  . DEPRESSION  . VENOUS INSUFFICIENCY  . VIRAL URI  . ASTHMA  . OSTEOARTHRITIS  . INCONTINENCE  . DUB (dysfunctional uterine bleeding)    No Known Allergies  Past Surgical History  Procedure Date  . Gastric bypass    TODD,JEFFREY ALLEN, MD No diagnosis found.   Jacqueline Hawkins comes in today 2.6 years out from her laparoscopic gastric bypass. So far she's loss 109 pounds or 58% of her excess body weight.Her diabetes has resolved, her obstructive sleep apnea has resolved, her arthritis is not a problem in her hip replacement is really doing great. I showed her picture of her preop photo and she is strikingly different now. She understands that this is a tool and she understands that carbs are things we have to avoid.  Plan return in one year Matt B. Daphine Deutscher, MD, [facility] [phone] beeper [phone]  03/22/2011 5:10 PM

## 2011-04-09 ENCOUNTER — Other Ambulatory Visit: Payer: Self-pay | Admitting: *Deleted

## 2011-04-09 DIAGNOSIS — F329 Major depressive disorder, single episode, unspecified: Secondary | ICD-10-CM

## 2011-04-09 DIAGNOSIS — E785 Hyperlipidemia, unspecified: Secondary | ICD-10-CM

## 2011-04-09 MED ORDER — BUPROPION HCL 100 MG PO TABS: 100.0000 mg | ORAL_TABLET | Freq: Three times a day (TID) | ORAL | Status: DC

## 2011-04-09 MED ORDER — ESCITALOPRAM OXALATE 20 MG PO TABS: 20.0000 mg | ORAL_TABLET | Freq: Every day | ORAL | Status: DC

## 2011-04-09 MED ORDER — SIMVASTATIN 40 MG PO TABS: 40.0000 mg | ORAL_TABLET | Freq: Every day | ORAL | Status: DC

## 2011-04-23 ENCOUNTER — Telehealth: Payer: Self-pay | Admitting: Family Medicine

## 2011-04-23 NOTE — Telephone Encounter (Signed)
Pt called and wants to know if she can get a 90-day refill of the Sub Ling. Vitamin B12. [facility] uses [facility]. Insurance requires 90-day scripts.

## 2011-04-24 MED ORDER — CYANOCOBALAMIN 1000 MCG SL SUBL: 1.0000 | SUBLINGUAL_TABLET | Freq: Every day | SUBLINGUAL | Status: DC

## 2011-04-24 NOTE — Telephone Encounter (Signed)
rx sent to pharmacy

## 2011-04-24 NOTE — Telephone Encounter (Signed)
ok 

## 2011-04-30 ENCOUNTER — Other Ambulatory Visit: Payer: Self-pay | Admitting: Family Medicine

## 2011-04-30 NOTE — Telephone Encounter (Signed)
Pt need new rx for vit b (sublingual) call into [facility] [address].

## 2011-05-01 MED ORDER — CYANOCOBALAMIN 1000 MCG SL SUBL: 1.0000 | SUBLINGUAL_TABLET | Freq: Every day | SUBLINGUAL | Status: DC

## 2011-05-01 NOTE — Telephone Encounter (Signed)
Faxed to pharmacy

## 2011-07-04 ENCOUNTER — Other Ambulatory Visit (HOSPITAL_COMMUNITY)
Admission: RE | Admit: 2011-07-04 | Discharge: 2011-07-04 | Disposition: A | Discharge status: Home / Self Care | Payer: Managed Care, Other (non HMO) | Source: Ambulatory Visit | Attending: Family Medicine | Admitting: Family Medicine

## 2011-07-04 ENCOUNTER — Encounter: Payer: Self-pay | Admitting: Family Medicine

## 2011-07-04 ENCOUNTER — Ambulatory Visit (INDEPENDENT_AMBULATORY_CARE_PROVIDER_SITE_OTHER): Payer: Managed Care, Other (non HMO) | Admitting: Family Medicine

## 2011-07-04 VITALS — BP 120/78 | Temp 97.9°F | Ht 63.0 in | Wt 201.0 lb

## 2011-07-04 DIAGNOSIS — M1990 Unspecified osteoarthritis, unspecified site: Secondary | ICD-10-CM

## 2011-07-04 DIAGNOSIS — Z01419 Encounter for gynecological examination (general) (routine) without abnormal findings: Secondary | ICD-10-CM

## 2011-07-04 DIAGNOSIS — F329 Major depressive disorder, single episode, unspecified: Secondary | ICD-10-CM

## 2011-07-04 DIAGNOSIS — E785 Hyperlipidemia, unspecified: Secondary | ICD-10-CM

## 2011-07-04 DIAGNOSIS — I872 Venous insufficiency (chronic) (peripheral): Secondary | ICD-10-CM

## 2011-07-04 LAB — TSH: TSH: 3.15 u[IU]/mL (ref 0.35–5.50)

## 2011-07-04 MED ORDER — SIMVASTATIN 40 MG PO TABS: 40.0000 mg | ORAL_TABLET | Freq: Every day | ORAL | Status: DC

## 2011-07-04 MED ORDER — ESCITALOPRAM OXALATE 20 MG PO TABS: 20.0000 mg | ORAL_TABLET | Freq: Every day | ORAL | Status: DC

## 2011-07-04 MED ORDER — BUPROPION HCL 100 MG PO TABS: 100.0000 mg | ORAL_TABLET | Freq: Three times a day (TID) | ORAL | Status: DC

## 2011-07-04 NOTE — Progress Notes (Signed)
  Subjective:    Patient ID: Jacqueline Hawkins, female    DOB: 1955/09/07, 56 y.o.   MRN: [id number]  HPI Jacqueline Hawkins is a 56 year old married female nonsmoker who comes in today for general physical examination  She has a history of mild depression for which she takes Wellbutrin 300 mg daily and Lexapro 20 mg each bedtime  She takes Zocor 40 mg daily and an aspirin tablet because of a history of hyperlipidemia  She takes sublingual vitamin B12 B12 level CM  She takes OTC Zantac for mild reflux when necessary  Her weight is down to 201 pounds. She was over 350 pounds when she had her gastric bypass surgery now 2 years ago.  She gets routine eye care, dental care, BSE monthly, last mammogram 5 years ago declined to get an annual mammogram reasons unknown  She's never had a colonoscopy and she declines to have a colonoscopy she says she just doesn't want one I try to talk her into it explained her why it's important and she still declines  Tetanus 2005, seasonal flu shot 2012, Pneumovax x2   Review of Systems  Constitutional: Negative.   HENT: Negative.   Eyes: Negative.   Respiratory: Negative.   Cardiovascular: Negative.   Gastrointestinal: Negative.   Genitourinary: Negative.   Musculoskeletal: Negative.   Neurological: Negative.   Hematological: Negative.   Psychiatric/Behavioral: Negative.        Objective:   Physical Exam  Constitutional: She appears well-developed and well-nourished.  HENT:  Head: Normocephalic and atraumatic.  Right Ear: External ear normal.  Left Ear: External ear normal.  Nose: Nose normal.  Mouth/Throat: Oropharynx is clear and moist.  Eyes: EOM are normal. Pupils are equal, round, and reactive to light.  Neck: Normal range of motion. Neck supple. No thyromegaly present.  Cardiovascular: Normal rate, regular rhythm, normal heart sounds and intact distal pulses.  Exam reveals no gallop and no friction rub.   No murmur heard. Pulmonary/Chest: Effort  normal and breath sounds normal.  Abdominal: Soft. Bowel sounds are normal. She exhibits no distension and no mass. There is no tenderness. There is no rebound.  Genitourinary: Vagina normal and uterus normal. Guaiac negative stool. No vaginal discharge found.       Bilateral breast exam normal  Musculoskeletal: Normal range of motion.  Lymphadenopathy:    She has no cervical adenopathy.  Neurological: She is alert. She has normal reflexes. No cranial nerve deficit. She exhibits normal muscle tone. Coordination normal.  Skin: Skin is warm and dry.  Psychiatric: She has a normal mood and affect. Her behavior is normal. Judgment and thought content normal.          Assessment & Plan:  Healthy female  Status post gastric bypass surgery continuing to work on her weight now down to 201 pounds  Mild depression continue Wellbutrin and Lexapro  Reflux esophagitis Zantac OTC when necessary  Hyperlipidemia continue Zocor and aspirin  Again recommended BSE monthly and you mammography and screening colonoscopy

## 2011-07-04 NOTE — Patient Instructions (Signed)
Continue your current medications  I would recommend BSE monthly, and you mammography, and screening colonoscopy  Return in one year sooner if any problems

## 2011-07-27 ENCOUNTER — Other Ambulatory Visit: Payer: Self-pay | Admitting: Family Medicine

## 2011-10-27 ENCOUNTER — Other Ambulatory Visit: Payer: Self-pay | Admitting: Family Medicine

## 2012-04-09 ENCOUNTER — Ambulatory Visit (INDEPENDENT_AMBULATORY_CARE_PROVIDER_SITE_OTHER): Payer: Managed Care, Other (non HMO) | Admitting: Surgery

## 2012-05-19 ENCOUNTER — Other Ambulatory Visit: Payer: Self-pay | Admitting: Family Medicine

## 2012-07-27 ENCOUNTER — Other Ambulatory Visit: Payer: Self-pay | Admitting: Family Medicine

## 2012-10-08 ENCOUNTER — Other Ambulatory Visit: Payer: Self-pay | Admitting: Family Medicine

## 2012-10-21 ENCOUNTER — Telehealth: Payer: Self-pay | Admitting: Family Medicine

## 2012-10-21 NOTE — Telephone Encounter (Signed)
PT is requesting a 3 month supply of the following medications; buPROPion (WELLBUTRIN) 100 MG tablet, escitalopram (LEXAPRO) 20 MG tablet, and simvastatin (ZOCOR) 40 MG tablet. [facility] [place]. She has requested to reschedule her CPX from Aug until October when Dr. Tawanna Cooler will be back. Please assist.

## 2012-10-22 MED ORDER — ESCITALOPRAM OXALATE 20 MG PO TABS: ORAL_TABLET | ORAL | Status: DC

## 2012-10-22 MED ORDER — BUPROPION HCL 100 MG PO TABS: ORAL_TABLET | ORAL | Status: DC

## 2012-10-22 MED ORDER — SIMVASTATIN 40 MG PO TABS: ORAL_TABLET | ORAL | Status: DC

## 2012-11-04 ENCOUNTER — Other Ambulatory Visit: Payer: Managed Care, Other (non HMO)

## 2012-11-09 ENCOUNTER — Encounter: Payer: Managed Care, Other (non HMO) | Admitting: Family Medicine

## 2012-12-22 ENCOUNTER — Other Ambulatory Visit (INDEPENDENT_AMBULATORY_CARE_PROVIDER_SITE_OTHER): Payer: Managed Care, Other (non HMO)

## 2012-12-22 DIAGNOSIS — Z0000 Encounter for general adult medical examination without abnormal findings: Secondary | ICD-10-CM

## 2012-12-22 LAB — CBC WITH DIFFERENTIAL/PLATELET
Basophils Absolute: 0 10*3/uL (ref 0.0–0.1)
Eosinophils Absolute: 0.1 10*3/uL (ref 0.0–0.7)
Lymphocytes Relative: 42.2 % (ref 12.0–46.0)
MCHC: 33.7 g/dL (ref 30.0–36.0)
MCV: 93.3 fl (ref 78.0–100.0)
Monocytes Absolute: 0.4 10*3/uL (ref 0.1–1.0)
Neutrophils Relative %: 45.3 % (ref 43.0–77.0)
RDW: 14.9 % — ABNORMAL HIGH (ref 11.5–14.6)

## 2012-12-22 LAB — POCT URINALYSIS DIPSTICK
Bilirubin, UA: NEGATIVE
Blood, UA: NEGATIVE
Glucose, UA: NEGATIVE
Ketones, UA: NEGATIVE
pH, UA: 5.5

## 2012-12-22 LAB — HEPATIC FUNCTION PANEL
AST: 21 U/L (ref 0–37)
Albumin: 3.6 g/dL (ref 3.5–5.2)
Total Bilirubin: 0.4 mg/dL (ref 0.3–1.2)
Total Protein: 6.6 g/dL (ref 6.0–8.3)

## 2012-12-22 LAB — LIPID PANEL
HDL: 61.2 mg/dL (ref >39.00)
Triglycerides: 43 mg/dL (ref 0.0–149.0)

## 2012-12-22 LAB — BASIC METABOLIC PANEL
BUN: 11 mg/dL (ref 6–23)
CO2: 29 mEq/L (ref 19–32)
Calcium: 9 mg/dL (ref 8.4–10.5)
Creatinine, Ser: 0.7 mg/dL (ref 0.4–1.2)
Glucose, Bld: 91 mg/dL (ref 70–99)

## 2012-12-22 LAB — TSH: TSH: 1.96 u[IU]/mL (ref 0.35–5.50)

## 2012-12-29 ENCOUNTER — Ambulatory Visit (INDEPENDENT_AMBULATORY_CARE_PROVIDER_SITE_OTHER): Payer: Managed Care, Other (non HMO) | Admitting: Family Medicine

## 2012-12-29 ENCOUNTER — Encounter: Payer: Self-pay | Admitting: Family Medicine

## 2012-12-29 VITALS — BP 110/80 | Temp 98.3°F | Ht 63.5 in | Wt 208.0 lb

## 2012-12-29 DIAGNOSIS — Z23 Encounter for immunization: Secondary | ICD-10-CM

## 2012-12-29 DIAGNOSIS — R32 Unspecified urinary incontinence: Secondary | ICD-10-CM

## 2012-12-29 DIAGNOSIS — I872 Venous insufficiency (chronic) (peripheral): Secondary | ICD-10-CM

## 2012-12-29 DIAGNOSIS — F329 Major depressive disorder, single episode, unspecified: Secondary | ICD-10-CM

## 2012-12-29 DIAGNOSIS — E785 Hyperlipidemia, unspecified: Secondary | ICD-10-CM

## 2012-12-29 DIAGNOSIS — F3289 Other specified depressive episodes: Secondary | ICD-10-CM

## 2012-12-29 MED ORDER — CYANOCOBALAMIN 1000 MCG SL SUBL: 1.0000 | SUBLINGUAL_TABLET | Freq: Every day | SUBLINGUAL | Status: AC

## 2012-12-29 MED ORDER — SIMVASTATIN 40 MG PO TABS: ORAL_TABLET | ORAL | Status: DC

## 2012-12-29 MED ORDER — ESCITALOPRAM OXALATE 20 MG PO TABS: ORAL_TABLET | ORAL | Status: DC

## 2012-12-29 MED ORDER — BUPROPION HCL 100 MG PO TABS: ORAL_TABLET | ORAL | Status: DC

## 2012-12-29 NOTE — Progress Notes (Signed)
  Subjective:    Patient ID: Jacqueline Hawkins, female    DOB: 05/23/1955, 57 y.o.   MRN: [id number]  HPI ...... Jacqueline Hawkins is a 57 year old married female nonsmoker who comes in today for general physical examination  She takes 300 mg of Wellbutrin, Lexapro 20 mg from history of mild depression. She also takes Zocor 40 mg dose one half tab daily  She takes prescription sublingual vitamin B12 one tablet daily for history of B12 deficiency she's had a gastric bypass. She also takes a baby aspirin daily  She gets routine eye care, dental care, BSE monthly, and you mammography, again declines colonoscopy. She states that she developed colon cancer it's on her  Vaccinations up-to-date   Review of Systems  Constitutional: Negative.   HENT: Negative.   Eyes: Negative.   Respiratory: Negative.   Cardiovascular: Negative.   Gastrointestinal: Negative.   Genitourinary: Negative.   Musculoskeletal: Negative.   Neurological: Negative.   Psychiatric/Behavioral: Negative.        Objective:   Physical Exam  Constitutional: She appears well-developed and well-nourished.  HENT:  Head: Normocephalic and atraumatic.  Right Ear: External ear normal.  Left Ear: External ear normal.  Nose: Nose normal.  Mouth/Throat: Oropharynx is clear and moist.  Eyes: EOM are normal. Pupils are equal, round, and reactive to light.  Neck: Normal range of motion. Neck supple. No thyromegaly present.  Cardiovascular: Normal rate, regular rhythm, normal heart sounds and intact distal pulses.  Exam reveals no gallop and no friction rub.   No murmur heard. Pulmonary/Chest: Effort normal and breath sounds normal.  Abdominal: Soft. Bowel sounds are normal. She exhibits no distension and no mass. There is no tenderness. There is no rebound.  Genitourinary:  Bilateral breast exam normal  Pelvic and Pap last year normal therefore not repeated because no GYN complaints  Musculoskeletal: Normal range of motion.   Lymphadenopathy:    She has no cervical adenopathy.  Neurological: She is alert. She has normal reflexes. No cranial nerve deficit. She exhibits normal muscle tone. Coordination normal.  Skin: Skin is warm and dry.  Psychiatric: She has a normal mood and affect. Her behavior is normal. Judgment and thought content normal.          Assessment & Plan:  Healthy female  History of gastric bypass continue sublingual B12 daily  History of mild depression continue Wellbutrin and Lexapro  History of hyperlipidemia continue Zocor and aspirin

## 2012-12-29 NOTE — Patient Instructions (Addendum)
Continue current medications  Remember to walk 30 minutes daily for good overall and bone health  Followup in 1 year sooner if any problems

## 2013-07-15 ENCOUNTER — Ambulatory Visit: Payer: Managed Care, Other (non HMO) | Admitting: Family Medicine

## 2013-08-09 ENCOUNTER — Encounter: Payer: Self-pay | Admitting: Family Medicine

## 2013-08-09 ENCOUNTER — Ambulatory Visit (INDEPENDENT_AMBULATORY_CARE_PROVIDER_SITE_OTHER): Payer: Managed Care, Other (non HMO) | Admitting: Family Medicine

## 2013-08-09 VITALS — BP 120/80 | Temp 98.5°F | Wt 210.0 lb

## 2013-08-09 DIAGNOSIS — J45909 Unspecified asthma, uncomplicated: Secondary | ICD-10-CM

## 2013-08-09 MED ORDER — PREDNISONE 20 MG PO TABS: ORAL_TABLET | ORAL | Status: DC

## 2013-08-09 MED ORDER — HYDROCODONE-HOMATROPINE 5-1.5 MG/5ML PO SYRP: ORAL_SOLUTION | ORAL | Status: DC

## 2013-08-09 NOTE — Progress Notes (Signed)
   Subjective:    Patient ID: Jacqueline LeepSusan T Hawkins, female    DOB: Apr 06, 1955, 58 y.o.   MRN: [id number]  HPI  Jacqueline Hawkins is a 58 year old married female nonsmoker who comes in today for evaluation of a cough for 2 weeks  She's had a history of allergic rhinitis in the spring and started taking some Zyrtec a couple weeks ago. She then developed a cough. She went to an urgent care and they told her she had bronchitis and gave her a Z-Pak which of course didn't do any good.  She has no fever sputum production shortness of breath at night etc.  Review of Systems    negative review of systems Objective:   Physical Exam  Well-developed and nourished female no acute distress vital signs stable she is afebrile respiratory rate is 12 and unlabored  HEENT were negative neck was supple no adenopathy lungs are clear except for mild expiratory wheezing      Assessment & Plan:  Allergic rhinitis with secondary asthma..........Marland Kitchen. prednisone burst and taper.

## 2013-08-09 NOTE — Progress Notes (Signed)
Pre visit review using our clinic review tool, if applicable. No additional management support is needed unless otherwise documented below in the visit note. 

## 2013-08-09 NOTE — Patient Instructions (Signed)
Prednisone 20 mg.......... to now then 2 every morning until you well then taper as outlined  Drink lots of water  Hydromet.........Marland Kitchen. 1/2-1 teaspoon at bedtime when necessary for cough

## 2013-08-13 ENCOUNTER — Telehealth: Payer: Self-pay | Admitting: Family Medicine

## 2013-08-13 NOTE — Telephone Encounter (Signed)
Error/call transferred to can

## 2013-08-13 NOTE — Telephone Encounter (Signed)
Left message on machine explaining that Dr Tawanna Cooler is not in the office and that she will need an appointment for a prescription.

## 2013-08-13 NOTE — Telephone Encounter (Signed)
Patient Information:  Caller Name: Letitia  Phone: [phone]  Patient: Jacqueline Hawkins, Jacqueline Hawkins  Gender: Female  DOB: 06-May-1955  Age: 58 Years  PCP: Kelle Darting [facility])  Office Follow Up:  Does the office need to follow up with this patient?: Yes  Instructions For The Office: Requests Rx krs/can  RN Note:  Seen in office 08/09/13 and told she had asthma, and prescribed cough syrup and steroids.  States cough keeps her from sleeping.  Afebrile.  Does not have nebulizer or inhaler.  Per asthma protocol, advised appt today; offered appt 08/13/13 1330 with Dr. Clent Ridges.  Patient states she is unable to come today or tomorrow to office; info to office for follow up.  May reach patient at [phone].  krs/can  Symptoms  Reason For Call & Symptoms: cough  Reviewed Health History In EMR: Yes  Reviewed Medications In EMR: Yes  Reviewed Allergies In EMR: Yes  Reviewed Surgeries / Procedures: Yes  Date of Onset of Symptoms: 07/26/2013  Guideline(s) Used:  Asthma Attack  Disposition Per Guideline:   See Today in Office  Reason For Disposition Reached:   Coughing continuously (nonstop) that keeps from working or sleeping, and not improved after inhaler or nebulizer  Advice Given:  N/A  Patient Refused Recommendation:  Patient Requests Prescription  Requests Rx krs/can

## 2013-12-19 ENCOUNTER — Other Ambulatory Visit: Payer: Self-pay | Admitting: Family Medicine

## 2014-02-02 ENCOUNTER — Other Ambulatory Visit (INDEPENDENT_AMBULATORY_CARE_PROVIDER_SITE_OTHER): Payer: Managed Care, Other (non HMO)

## 2014-02-02 DIAGNOSIS — Z0000 Encounter for general adult medical examination without abnormal findings: Secondary | ICD-10-CM

## 2014-02-02 LAB — CBC WITH DIFFERENTIAL/PLATELET
Basophils Absolute: 0 10*3/uL (ref 0.0–0.1)
Basophils Relative: 0.1 % (ref 0.0–3.0)
Eosinophils Absolute: 0 10*3/uL (ref 0.0–0.7)
Eosinophils Relative: 0.4 % (ref 0.0–5.0)
HEMATOCRIT: 42.1 % (ref 36.0–46.0)
Hemoglobin: 13.8 g/dL (ref 12.0–15.0)
Lymphocytes Relative: 17.2 % (ref 12.0–46.0)
Lymphs Abs: 0.7 10*3/uL (ref 0.7–4.0)
MCHC: 32.9 g/dL (ref 30.0–36.0)
MCV: 92 fl (ref 78.0–100.0)
MONO ABS: 0.3 10*3/uL (ref 0.1–1.0)
MONOS PCT: 7.2 % (ref 3.0–12.0)
Neutro Abs: 2.9 10*3/uL (ref 1.4–7.7)
Neutrophils Relative %: 75.1 % (ref 43.0–77.0)
Platelets: 212 10*3/uL (ref 150.0–400.0)
RBC: 4.58 Mil/uL (ref 3.87–5.11)
RDW: 15.3 % (ref 11.5–15.5)
WBC: 3.8 10*3/uL — ABNORMAL LOW (ref 4.0–10.5)

## 2014-02-02 LAB — HEPATIC FUNCTION PANEL
ALK PHOS: 55 U/L (ref 39–117)
ALT: 36 U/L — ABNORMAL HIGH (ref 0–35)
AST: 35 U/L (ref 0–37)
Albumin: 3 g/dL — ABNORMAL LOW (ref 3.5–5.2)
BILIRUBIN DIRECT: 0 mg/dL (ref 0.0–0.3)
TOTAL PROTEIN: 6.3 g/dL (ref 6.0–8.3)
Total Bilirubin: 0.4 mg/dL (ref 0.2–1.2)

## 2014-02-02 LAB — LIPID PANEL
Cholesterol: 134 mg/dL (ref 0–200)
HDL: 45.1 mg/dL (ref >39.00)
LDL CALC: 77 mg/dL (ref 0–99)
NonHDL: 88.9
Total CHOL/HDL Ratio: 3
Triglycerides: 61 mg/dL (ref 0.0–149.0)
VLDL: 12.2 mg/dL (ref 0.0–40.0)

## 2014-02-02 LAB — BASIC METABOLIC PANEL
BUN: 11 mg/dL (ref 6–23)
CALCIUM: 8.9 mg/dL (ref 8.4–10.5)
CO2: 29 mEq/L (ref 19–32)
Chloride: 105 mEq/L (ref 96–112)
Creatinine, Ser: 0.8 mg/dL (ref 0.4–1.2)
GFR: 79.39 mL/min (ref >60.00)
Glucose, Bld: 116 mg/dL — ABNORMAL HIGH (ref 70–99)
POTASSIUM: 4.3 meq/L (ref 3.5–5.1)
Sodium: 139 mEq/L (ref 135–145)

## 2014-02-02 LAB — POCT URINALYSIS DIPSTICK
Bilirubin, UA: NEGATIVE
Blood, UA: NEGATIVE
Glucose, UA: NEGATIVE
KETONES UA: NEGATIVE
Nitrite, UA: NEGATIVE
PROTEIN UA: NEGATIVE
SPEC GRAV UA: 1.01
Urobilinogen, UA: 0.2
pH, UA: 6

## 2014-02-02 LAB — TSH: TSH: 2.05 u[IU]/mL (ref 0.35–4.50)

## 2014-02-10 ENCOUNTER — Encounter: Payer: Self-pay | Admitting: Family Medicine

## 2014-02-10 ENCOUNTER — Ambulatory Visit (INDEPENDENT_AMBULATORY_CARE_PROVIDER_SITE_OTHER): Payer: Managed Care, Other (non HMO) | Admitting: Family Medicine

## 2014-02-10 VITALS — BP 120/78 | Temp 98.8°F | Ht 63.0 in | Wt 215.0 lb

## 2014-02-10 DIAGNOSIS — J45909 Unspecified asthma, uncomplicated: Secondary | ICD-10-CM

## 2014-02-10 DIAGNOSIS — I872 Venous insufficiency (chronic) (peripheral): Secondary | ICD-10-CM

## 2014-02-10 DIAGNOSIS — E785 Hyperlipidemia, unspecified: Secondary | ICD-10-CM

## 2014-02-10 DIAGNOSIS — F32A Depression, unspecified: Secondary | ICD-10-CM

## 2014-02-10 DIAGNOSIS — F329 Major depressive disorder, single episode, unspecified: Secondary | ICD-10-CM

## 2014-02-10 DIAGNOSIS — Z23 Encounter for immunization: Secondary | ICD-10-CM

## 2014-02-10 MED ORDER — ESCITALOPRAM OXALATE 20 MG PO TABS
ORAL_TABLET | ORAL | Status: DC
Start: 2014-02-10 — End: 2015-02-04

## 2014-02-10 MED ORDER — BUPROPION HCL 100 MG PO TABS: ORAL_TABLET | ORAL | Status: DC

## 2014-02-10 NOTE — Progress Notes (Signed)
Pre visit review using our clinic review tool, if applicable. No additional management support is needed unless otherwise documented below in the visit note. 

## 2014-02-10 NOTE — Patient Instructions (Signed)
Stop the Zocor  Continue your other medications  Return in one year sooner if any problems  I will put in a request to GI to have you meet with them to discuss the new noninvasive screening test for colon cancer

## 2014-02-10 NOTE — Progress Notes (Signed)
   Subjective:    Patient ID: Jacqueline Hawkins, female    DOB: [birth year], 58 y.o.   MRN: [id number]  HPI Jacqueline Hawkins is a 58 year old married female nonsmoker who comes in today for evaluation  She had gastric bypass surgery is lost over 150 pounds. She's done at 215 pounds. She takes Wellbutrin 300 mg daily, Lexapro 20 mg daily, 20 mg Zocor daily LDL is so low we can probably go down to 10 mg a day her no milligrams a day  She gets routine eye care, dental care, BSE monthly, and you mammography,,,,,,,, skipped last year,,,, advised to go yearly, colonoscopy ........... she's never had a colonoscopy and refused to go. I wonder if the new immunologic screening tests would be of value for her. I will refer her to GI for consultation  Vaccinations updated by Fleet Contrasachel  Pap smear 2 years ago normal therefore not repeated   Review of Systems  Constitutional: Negative.   HENT: Negative.   Eyes: Negative.   Respiratory: Negative.   Cardiovascular: Negative.   Gastrointestinal: Negative.   Endocrine: Negative.   Genitourinary: Negative.   Musculoskeletal: Negative.   Skin: Negative.   Allergic/Immunologic: Negative.   Neurological: Negative.   Hematological: Negative.   Psychiatric/Behavioral: Negative.        Objective:   Physical Exam  Constitutional: She appears well-developed and well-nourished.  HENT:  Head: Normocephalic and atraumatic.  Right Ear: External ear normal.  Left Ear: External ear normal.  Nose: Nose normal.  Mouth/Throat: Oropharynx is clear and moist.  Eyes: EOM are normal. Pupils are equal, round, and reactive to light.  Neck: Normal range of motion. Neck supple. No JVD present. No tracheal deviation present. No thyromegaly present.  Cardiovascular: Normal rate, regular rhythm, normal heart sounds and intact distal pulses.  Exam reveals no gallop and no friction rub.   No murmur heard. Pulmonary/Chest: Effort normal and breath sounds normal. No stridor. No respiratory  distress. She has no wheezes. She has no rales. She exhibits no tenderness.  Abdominal: Soft. Bowel sounds are normal. She exhibits no distension and no mass. There is no tenderness. There is no rebound and no guarding.  Musculoskeletal: Normal range of motion. She exhibits no edema or tenderness.  Lymphadenopathy:    She has no cervical adenopathy.  Neurological: She is alert. She has normal reflexes. No cranial nerve deficit. She exhibits normal muscle tone. Coordination normal.  Skin: Skin is warm and dry. No rash noted. No erythema. No pallor.  Psychiatric: She has a normal mood and affect. Her behavior is normal. Judgment and thought content normal.  Nursing note and vitals reviewed.         Assessment & Plan:  Healthy female  History of obesity weight down 150 pounds status post gastric bypass surgery  History of mild depression continue Wellbutrin 300 mg daily and Lexapro 20 mg daily  Hyperlipidemia lipids basically normal on 20 mg a day. I wonder she can't go to 0 mg a day

## 2014-04-04 ENCOUNTER — Telehealth: Payer: Self-pay | Admitting: Family Medicine

## 2014-04-04 MED ORDER — PREDNISONE 20 MG PO TABS: 20.0000 mg | ORAL_TABLET | Freq: Every day | ORAL | Status: DC

## 2014-04-04 NOTE — Telephone Encounter (Signed)
Okay per Dr Tawanna Coolerodd.  Rx sent and patient is aware.

## 2014-04-04 NOTE — Telephone Encounter (Signed)
Pt calling to report she has been experiencing a cough for the last 2 weeks which she thinks is related to her asthma.  She states she feels fine other than the cough.  She will complete her last does of prednisone on Friday and wants to know if she will need to start another dose of prednisone since she still has the cough.  Pharmacy:  [facility]

## 2014-04-11 ENCOUNTER — Encounter: Payer: Self-pay | Admitting: Family Medicine

## 2014-04-11 ENCOUNTER — Ambulatory Visit (INDEPENDENT_AMBULATORY_CARE_PROVIDER_SITE_OTHER)
Admission: RE | Admit: 2014-04-11 | Discharge: 2014-04-11 | Disposition: A | Discharge status: Home / Self Care | Payer: Managed Care, Other (non HMO) | Source: Ambulatory Visit | Attending: Family Medicine | Admitting: Family Medicine

## 2014-04-11 ENCOUNTER — Ambulatory Visit (INDEPENDENT_AMBULATORY_CARE_PROVIDER_SITE_OTHER): Payer: Managed Care, Other (non HMO) | Admitting: Family Medicine

## 2014-04-11 VITALS — BP 110/70 | HR 68 | Temp 98.2°F | Wt 222.0 lb

## 2014-04-11 DIAGNOSIS — J069 Acute upper respiratory infection, unspecified: Secondary | ICD-10-CM

## 2014-04-11 DIAGNOSIS — R059 Cough, unspecified: Secondary | ICD-10-CM

## 2014-04-11 DIAGNOSIS — R05 Cough: Secondary | ICD-10-CM

## 2014-04-11 MED ORDER — ALBUTEROL SULFATE HFA 108 (90 BASE) MCG/ACT IN AERS: 2.0000 | INHALATION_SPRAY | Freq: Four times a day (QID) | RESPIRATORY_TRACT | Status: DC | PRN

## 2014-04-11 NOTE — Progress Notes (Signed)
HPI:  URI: -started:  A little over 2 weeks  -symptoms:clear nasal congestion, L ear pain, PND, productive cough - did course of prednisone with PCP as gets flares in asthma at times that present as cough, mild SOB intermittently -denies: fever, SOB, NVD, tooth pain -has tried: prednisone which did not help -sick contacts/travel/risks: denies flu exposure, tick exposure or or Ebola risks -Hx of: asthma - mild intermittent ROS: See pertinent positives and negatives per HPI.  Past Medical History  Diagnosis Date  . OA (osteoarthritis)   . Obesity   . Depression   . Diabetes mellitus   . Hyperlipidemia   . Venous insufficiency   . Asthma     Past Surgical History  Procedure Laterality Date  . Gastric bypass      Family History  Problem Relation Age of Onset  . Diabetes Other   . Thyroid disease Other     History   Social History  . Marital Status: Divorced    Spouse Name: N/A    Number of Children: N/A  . Years of Education: N/A   Social History Main Topics  . Smoking status: Former Games developermoker  . Smokeless tobacco: None  . Alcohol Use: No  . Drug Use: No  . Sexual Activity: None   Other Topics Concern  . None   Social History Narrative     Current outpatient prescriptions:  .  aspirin 81 MG tablet, Take 1 tablet (81 mg total) by mouth daily., Disp: 90 tablet, Rfl: 3 .  buPROPion (WELLBUTRIN) 100 MG tablet, TAKE 1 TABLET (100 MG TOTAL) BY MOUTH 3 (THREE) TIMES DAILY., Disp: 300 tablet, Rfl: 3 .  Cyanocobalamin 1000 MCG SUBL, Place 1 tablet (1,000 mcg total) under the tongue daily., Disp: 90 tablet, Rfl: 3 .  escitalopram (LEXAPRO) 20 MG tablet, TAKE 1 TABLET (20 MG TOTAL) BY MOUTH DAILY., Disp: 90 tablet, Rfl: 3 .  Multiple Vitamins-Minerals ([facility] DAILY MULTIPLE PLUS MIN) TABS, TAKE 1 TABLET BY MOUTH DAILY., Disp: 90 tablet, Rfl: 3 .  predniSONE (DELTASONE) 20 MG tablet, Take 1 tablet (20 mg total) by mouth daily with breakfast., Disp: 30 tablet, Rfl: 0 .   simvastatin (ZOCOR) 40 MG tablet, TAKE 1 TABLET (40 MG TOTAL) BY MOUTH AT BEDTIME., Disp: 100 tablet, Rfl: 2 .  albuterol (PROVENTIL HFA;VENTOLIN HFA) 108 (90 BASE) MCG/ACT inhaler, Inhale 2 puffs into the lungs every 6 (six) hours as needed., Disp: 1 Inhaler, Rfl: 0  EXAM:  Filed Vitals:   04/11/14 1402  BP: 110/70  Pulse: 68  Temp: 98.2 F (36.8 C)    Body mass index is 39.34 kg/(m^2).  GENERAL: vitals reviewed and listed above, alert, oriented, appears well hydrated and in no acute distress  HEENT: atraumatic, conjunttiva clear, no obvious abnormalities on inspection of external nose and ears, normal appearance of ear canals and TMs with clear effusion bilat, clear nasal congestion, mild post oropharyngeal erythema with PND, no tonsillar edema or exudate, no sinus TTP  NECK: no obvious masses on inspection  LUNGS: clear to auscultation bilaterally, no wheezes, rales or rhonchi, good air movement  CV: HRRR, no peripheral edema  MS: moves all extremities without noticeable abnormality  PSYCH: pleasant and cooperative, no obvious depression or anxiety  ASSESSMENT AND PLAN:  Discussed the following assessment and plan:  Cough - Plan: albuterol (PROVENTIL HFA;VENTOLIN HFA) 108 (90 BASE) MCG/ACT inhaler, DG Chest 2 View  Acute upper respiratory infection  -given HPI and exam findings today, a serious infection  or illness is unlikely. We discussed potential etiologies, with VURI being most likele with eustachian dysfunction, and advised supportive care and monitoring. We discussed treatment side effects, likely course, antibiotic misuse, transmission, and signs of developing a serious illness. -INS, nasal decongestion short term for PND and ETD -cxr to exclude cap - but unlikely -of course, we advised to return or notify a doctor immediately if symptoms worsen or persist or new concerns arise.    Patient Instructions  -go get chest xray - if pneumonia we will need to do an  antibiotic  -nasal saline wash 2-3 times daily (use prepackaged nasal saline or bottled/distilled water if making your own)   -use AFRIN nasal spray for 4 days then STOP- but do NOT use longer then 4 days  -use flonase daily for 21 days  -inhaler as needed per instructions if helps  -can use tylenol or ibuprofen as directed for aches and sorethroat  -in the winter time, using a humidifier at night is helpful (please follow cleaning instructions)  -if you are taking a cough medication - use only as directed, may also try a teaspoon of honey to coat the throat and throat lozenges  -for sore throat, salt water gargles can help  -follow up if you have fevers, facial pain, tooth pain, difficulty breathing or are worsening or not getting better in 5-7 days      Jacqueline Behl R.

## 2014-04-11 NOTE — Patient Instructions (Addendum)
-  go get chest xray - if pneumonia we will need to do an antibiotic  -nasal saline wash 2-3 times daily (use prepackaged nasal saline or bottled/distilled water if making your own)   -use AFRIN nasal spray for 4 days then STOP- but do NOT use longer then 4 days  -use flonase daily for 21 days  -inhaler as needed per instructions if helps  -can use tylenol or ibuprofen as directed for aches and sorethroat  -in the winter time, using a humidifier at night is helpful (please follow cleaning instructions)  -if you are taking a cough medication - use only as directed, may also try a teaspoon of honey to coat the throat and throat lozenges  -for sore throat, salt water gargles can help  -follow up if you have fevers, facial pain, tooth pain, difficulty breathing or are worsening or not getting better in 5-7 days

## 2014-04-11 NOTE — Progress Notes (Signed)
Pre visit review using our clinic review tool, if applicable. No additional management support is needed unless otherwise documented below in the visit note. 

## 2014-04-12 ENCOUNTER — Telehealth: Payer: Self-pay | Admitting: Family Medicine

## 2014-04-12 NOTE — Telephone Encounter (Signed)
Pt would like chest xray results

## 2014-04-12 NOTE — Telephone Encounter (Signed)
Patient is aware of xray results

## 2014-08-24 ENCOUNTER — Telehealth: Payer: Self-pay | Admitting: *Deleted

## 2014-08-24 NOTE — Telephone Encounter (Signed)
Left a message for the pt call the office back (need date of last mammogram).

## 2014-09-12 ENCOUNTER — Telehealth: Payer: Self-pay

## 2014-09-12 MED ORDER — BUPROPION HCL 100 MG PO TABS: ORAL_TABLET | ORAL | Status: DC

## 2014-09-12 NOTE — Telephone Encounter (Signed)
[id number] [place], [place] - [address] AT [address] [facility] [address] [address]: buPROPion (WELLBUTRIN) 100 MG tablet

## 2014-10-12 ENCOUNTER — Telehealth: Payer: Self-pay | Admitting: Family Medicine

## 2014-10-12 NOTE — Telephone Encounter (Signed)
Patient Name: Jacqueline MarinSUSAN Ramberg DOB: [birth year] Initial Comment Caller states grandson was DX w/ Hand, foot and mouth, now she has the same s/s, wants to know what cream to use Nurse Assessment Nurse: Elijah Birkaldwell, RN, Stark BrayLynda Date/Time (Eastern Time): 10/12/2014 1:39:19 PM Confirm and document reason for call. If symptomatic, describe symptoms. ---Caller states grandson was dx with hand, foot and mouth. Now she has the same symptoms, wants to know what cream to use. Has rash/blisters on hands, soles of feet along with sores in mouth. Using Ibuprofen & Tylenol, having itching & pain mostly of the hands. She developed symptoms 3 days after he had it. Has the patient traveled out of the country within the last 30 days? ---Not Applicable Does the patient require triage? ---Yes Related visit to physician within the last 2 weeks? ---No Does the PT have any chronic conditions? (i.e. diabetes, asthma, etc.) ---No Guidelines Guideline Title Affirmed Question Affirmed Notes Blister - Foot and Hand Prevention of hand blisters from sports or using tools, questions about Final Disposition User Home Care [place], RN, Stark BrayLynda Comments Appropriate guideline not available for adult triage. Caller is sure this is hand, foot mouth because she cared for her grandson who had it. She wanted to know what she could use on hands for the itching. Nurse advised that she could try using a 1% Hydrocortisone cream, ice, or a cold pack. Caller states she will try some Benadryl at night for the itching. She is most bothered by the blisters on her hands & does not feel she needs to be seen at this time. Advised to call back if symptoms change or worsen. Disagree/Comply: Comply

## 2014-10-13 NOTE — Telephone Encounter (Signed)
noted 

## 2014-12-20 LAB — HM MAMMOGRAPHY

## 2014-12-29 ENCOUNTER — Encounter: Payer: Self-pay | Admitting: Family Medicine

## 2015-01-24 ENCOUNTER — Telehealth: Payer: Self-pay | Admitting: Family Medicine

## 2015-01-24 DIAGNOSIS — E119 Type 2 diabetes mellitus without complications: Secondary | ICD-10-CM

## 2015-01-24 DIAGNOSIS — E785 Hyperlipidemia, unspecified: Secondary | ICD-10-CM

## 2015-01-24 NOTE — Telephone Encounter (Signed)
Pt is on vacation on requesting a cpx with dr todd on 02-06-15. Can I create something?

## 2015-01-24 NOTE — Telephone Encounter (Signed)
No work-ins per Dr Tawanna Coolerodd.  Thanks

## 2015-01-26 NOTE — Telephone Encounter (Signed)
That is ok 

## 2015-01-26 NOTE — Telephone Encounter (Signed)
Pt would like to have cpx before end of year. Kandee KeenCory can I sch? Pt does not want to re-est with you just physicaL

## 2015-01-27 NOTE — Telephone Encounter (Signed)
Labs ordered.

## 2015-01-27 NOTE — Telephone Encounter (Signed)
Pt will wait for dr todd for a physical in march 2017. Pt would like to add A1C to blood work. Can I sch?

## 2015-02-04 ENCOUNTER — Other Ambulatory Visit: Payer: Self-pay | Admitting: Family Medicine

## 2015-03-16 ENCOUNTER — Encounter: Payer: Self-pay | Admitting: Internal Medicine

## 2015-06-09 ENCOUNTER — Other Ambulatory Visit (INDEPENDENT_AMBULATORY_CARE_PROVIDER_SITE_OTHER): Payer: Managed Care, Other (non HMO)

## 2015-06-09 DIAGNOSIS — E785 Hyperlipidemia, unspecified: Secondary | ICD-10-CM

## 2015-06-09 DIAGNOSIS — E119 Type 2 diabetes mellitus without complications: Secondary | ICD-10-CM | POA: Diagnosis not present

## 2015-06-09 LAB — HEPATIC FUNCTION PANEL
ALT: 16 U/L (ref 0–35)
AST: 21 U/L (ref 0–37)
Albumin: 3.8 g/dL (ref 3.5–5.2)
Alkaline Phosphatase: 58 U/L (ref 39–117)
BILIRUBIN TOTAL: 0.4 mg/dL (ref 0.2–1.2)
Bilirubin, Direct: 0.1 mg/dL (ref 0.0–0.3)
Total Protein: 6.7 g/dL (ref 6.0–8.3)

## 2015-06-09 LAB — MICROALBUMIN / CREATININE URINE RATIO
CREATININE, U: 131.5 mg/dL
Microalb Creat Ratio: 0.5 mg/g (ref 0.0–30.0)

## 2015-06-09 LAB — TSH: TSH: 2.27 u[IU]/mL (ref 0.35–4.50)

## 2015-06-09 LAB — CBC WITH DIFFERENTIAL/PLATELET
BASOS ABS: 0 10*3/uL (ref 0.0–0.1)
Basophils Relative: 0.5 % (ref 0.0–3.0)
Eosinophils Absolute: 0.1 10*3/uL (ref 0.0–0.7)
Eosinophils Relative: 3.4 % (ref 0.0–5.0)
HCT: 34.7 % — ABNORMAL LOW (ref 36.0–46.0)
Hemoglobin: 11.5 g/dL — ABNORMAL LOW (ref 12.0–15.0)
LYMPHS ABS: 1.7 10*3/uL (ref 0.7–4.0)
LYMPHS PCT: 41.1 % (ref 12.0–46.0)
MCHC: 33 g/dL (ref 30.0–36.0)
MCV: 79.5 fl (ref 78.0–100.0)
MONO ABS: 0.4 10*3/uL (ref 0.1–1.0)
Monocytes Relative: 8.9 % (ref 3.0–12.0)
NEUTROS ABS: 1.9 10*3/uL (ref 1.4–7.7)
NEUTROS PCT: 46.1 % (ref 43.0–77.0)
PLATELETS: 284 10*3/uL (ref 150.0–400.0)
RBC: 4.36 Mil/uL (ref 3.87–5.11)
RDW: 19.5 % — AB (ref 11.5–15.5)
WBC: 4.2 10*3/uL (ref 4.0–10.5)

## 2015-06-09 LAB — BASIC METABOLIC PANEL
BUN: 15 mg/dL (ref 6–23)
CALCIUM: 9.1 mg/dL (ref 8.4–10.5)
CO2: 30 mEq/L (ref 19–32)
CREATININE: 0.69 mg/dL (ref 0.40–1.20)
Chloride: 106 mEq/L (ref 96–112)
GFR: 92.38 mL/min (ref >60.00)
Glucose, Bld: 96 mg/dL (ref 70–99)
Potassium: 4.2 mEq/L (ref 3.5–5.1)
Sodium: 140 mEq/L (ref 135–145)

## 2015-06-09 LAB — POCT URINALYSIS DIPSTICK
Bilirubin, UA: NEGATIVE
Blood, UA: NEGATIVE
GLUCOSE UA: NEGATIVE
Ketones, UA: NEGATIVE
NITRITE UA: NEGATIVE
PROTEIN UA: NEGATIVE
SPEC GRAV UA: 1.025
UROBILINOGEN UA: 0.2
pH, UA: 5.5

## 2015-06-09 LAB — LIPID PANEL
CHOLESTEROL: 204 mg/dL — AB (ref 0–200)
HDL: 65.6 mg/dL (ref >39.00)
LDL CALC: 128 mg/dL — AB (ref 0–99)
NonHDL: 138.45
TRIGLYCERIDES: 52 mg/dL (ref 0.0–149.0)
Total CHOL/HDL Ratio: 3
VLDL: 10.4 mg/dL (ref 0.0–40.0)

## 2015-06-09 LAB — HEMOGLOBIN A1C: HEMOGLOBIN A1C: 6.3 % (ref 4.6–6.5)

## 2015-06-12 ENCOUNTER — Other Ambulatory Visit: Payer: Managed Care, Other (non HMO)

## 2015-06-16 ENCOUNTER — Other Ambulatory Visit: Payer: Self-pay | Admitting: Family Medicine

## 2015-06-19 ENCOUNTER — Other Ambulatory Visit (HOSPITAL_COMMUNITY)
Admission: RE | Admit: 2015-06-19 | Discharge: 2015-06-19 | Disposition: A | Discharge status: Home / Self Care | Payer: Managed Care, Other (non HMO) | Source: Ambulatory Visit | Attending: Family Medicine | Admitting: Family Medicine

## 2015-06-19 ENCOUNTER — Ambulatory Visit (INDEPENDENT_AMBULATORY_CARE_PROVIDER_SITE_OTHER): Payer: Managed Care, Other (non HMO) | Admitting: Family Medicine

## 2015-06-19 VITALS — BP 120/84 | Temp 98.3°F | Ht 63.0 in | Wt 230.0 lb

## 2015-06-19 DIAGNOSIS — D649 Anemia, unspecified: Secondary | ICD-10-CM | POA: Diagnosis not present

## 2015-06-19 DIAGNOSIS — Z1151 Encounter for screening for human papillomavirus (HPV): Secondary | ICD-10-CM | POA: Diagnosis not present

## 2015-06-19 DIAGNOSIS — F32A Depression, unspecified: Secondary | ICD-10-CM

## 2015-06-19 DIAGNOSIS — E785 Hyperlipidemia, unspecified: Secondary | ICD-10-CM

## 2015-06-19 DIAGNOSIS — F329 Major depressive disorder, single episode, unspecified: Secondary | ICD-10-CM | POA: Diagnosis not present

## 2015-06-19 DIAGNOSIS — I872 Venous insufficiency (chronic) (peripheral): Secondary | ICD-10-CM | POA: Diagnosis not present

## 2015-06-19 DIAGNOSIS — Z01419 Encounter for gynecological examination (general) (routine) without abnormal findings: Secondary | ICD-10-CM | POA: Diagnosis present

## 2015-06-19 DIAGNOSIS — Z0000 Encounter for general adult medical examination without abnormal findings: Secondary | ICD-10-CM

## 2015-06-19 DIAGNOSIS — D509 Iron deficiency anemia, unspecified: Secondary | ICD-10-CM | POA: Insufficient documentation

## 2015-06-19 LAB — FOLATE: FOLATE: 20.9 ng/mL (ref >5.9)

## 2015-06-19 LAB — IRON: Iron: 42 ug/dL (ref 42–145)

## 2015-06-19 LAB — VITAMIN B12: Vitamin B-12: >1500 pg/mL — ABNORMAL HIGH (ref 211–911)

## 2015-06-19 LAB — FERRITIN: FERRITIN: 6.5 ng/mL — AB (ref 10.0–291.0)

## 2015-06-19 MED ORDER — ESCITALOPRAM OXALATE 20 MG PO TABS: 20.0000 mg | ORAL_TABLET | Freq: Every day | ORAL | Status: DC

## 2015-06-19 MED ORDER — BUPROPION HCL 100 MG PO TABS: ORAL_TABLET | ORAL | Status: DC

## 2015-06-19 NOTE — Progress Notes (Signed)
   Subjective:    Patient ID: Jacqueline Hawkins, female    DOB: [birth year], 60 y.o.   MRN: [id number]  HPI [name] is a 60 year old married female nonsmoker who comes in today for general physical examination  She has a history of mild depression and takes 3 mg Wellbutrin daily  She also takes aspirin and Lexapro daily. She did use albuterol for some post viral type asthma but hasn't needed any for quite a while  She takes Zyrtec for allergic rhinitis vitamin C Fish oil  She gets routine eye care, dental care, BSE monthly, annual mammography, never had a colonoscopy. She's always declined to have a colonoscopy. We've explained to her the process and she adamantly refuses. She is willing to talk with GI about a noninvasive way to screen for colon cancer  Vaccinations up-to-date  She got her weight down in January to 222 she's up to 2:30 now.   Review of Systems  Constitutional: Negative.   HENT: Negative.   Eyes: Negative.   Respiratory: Negative.   Cardiovascular: Negative.   Gastrointestinal: Negative.   Endocrine: Negative.   Genitourinary: Negative.   Musculoskeletal: Negative.   Skin: Negative.   Allergic/Immunologic: Negative.   Neurological: Negative.   Hematological: Negative.   Psychiatric/Behavioral: Negative.        Objective:   Physical Exam  Constitutional: She appears well-developed and well-nourished.  HENT:  Head: Normocephalic and atraumatic.  Right Ear: External ear normal.  Left Ear: External ear normal.  Nose: Nose normal.  Mouth/Throat: Oropharynx is clear and moist.  Eyes: EOM are normal. Pupils are equal, round, and reactive to light.  Neck: Normal range of motion. Neck supple. No JVD present. No tracheal deviation present. No thyromegaly present.  Cardiovascular: Normal rate, regular rhythm, normal heart sounds and intact distal pulses.  Exam reveals no gallop and no friction rub.   No murmur heard. Pulmonary/Chest: Effort normal and breath sounds  normal. No stridor. No respiratory distress. She has no wheezes. She has no rales. She exhibits no tenderness.  Abdominal: Soft. Bowel sounds are normal. She exhibits no distension and no mass. There is no tenderness. There is no rebound and no guarding.  Genitourinary: Vagina normal and uterus normal. Guaiac negative stool. No vaginal discharge found.  Bilateral breast exam normal  Pelvic normal path pending  Musculoskeletal: Normal range of motion.  Lymphadenopathy:    She has no cervical adenopathy.  Neurological: She is alert. She has normal reflexes. No cranial nerve deficit. She exhibits normal muscle tone. Coordination normal.  Skin: Skin is warm and dry. No rash noted. No erythema. No pallor.  Psychiatric: She has a normal mood and affect. Her behavior is normal. Judgment and thought content normal.  Nursing note and vitals reviewed.         Assessment & Plan:  Obesity....... again encouraged diet exercise and weight loss,,,,,,,Status post gastric bypass surgery  History of depression.....Marland Kitchen. continue Wellbutrin and Lexapro and aspirin  Anemia decrease in hemoglobin from 13.8 down to 11........ stop aspirin,,,,,,, labs,,,,,,,,,, GI consult

## 2015-06-19 NOTE — Progress Notes (Signed)
Pre visit review using our clinic review tool, if applicable. No additional management support is needed unless otherwise documented below in the visit note. 

## 2015-06-19 NOTE — Patient Instructions (Signed)
Stop all aspirin and aspirin like products  Labs today  We will get you set up for a GI consult ASAP to help determine the etiology of the anemia

## 2015-06-21 ENCOUNTER — Other Ambulatory Visit: Payer: Self-pay | Admitting: Family Medicine

## 2015-06-21 DIAGNOSIS — D649 Anemia, unspecified: Secondary | ICD-10-CM

## 2015-06-21 LAB — CYTOLOGY - PAP

## 2015-06-22 ENCOUNTER — Ambulatory Visit (INDEPENDENT_AMBULATORY_CARE_PROVIDER_SITE_OTHER): Payer: Managed Care, Other (non HMO) | Admitting: Physician Assistant

## 2015-06-22 ENCOUNTER — Encounter: Payer: Self-pay | Admitting: Physician Assistant

## 2015-06-22 VITALS — BP 108/80 | HR 92 | Ht 62.25 in | Wt 231.1 lb

## 2015-06-22 DIAGNOSIS — D509 Iron deficiency anemia, unspecified: Secondary | ICD-10-CM

## 2015-06-22 DIAGNOSIS — Z9884 Bariatric surgery status: Secondary | ICD-10-CM | POA: Diagnosis not present

## 2015-06-22 MED ORDER — INTEGRA PLUS PO CAPS: 1.0000 | ORAL_CAPSULE | Freq: Every day | ORAL | Status: DC

## 2015-06-22 NOTE — Progress Notes (Signed)
Patient ID: Jacqueline Hawkins, female   DOB: [birth year], 60 y.o.   MRN: [id number]   Subjective:    Patient ID: Jacqueline Hawkins, female    DOB: December 12, 1955, 60 y.o.   MRN: [id number]  HPI  Jacqueline Hawkins is a pleasant 60 year old white female urge today by Dr. Tawanna Cooler for evaluation of iron deficiency anemia. Patient known very remotely to Dr. Lina Sar seen many years ago for "fatty liver". Patient has not had any prior EGD or colonoscopy. 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 Daphine Deutscher in 2010. She says she has lost 150 pounds. Patient had labs drawn earlier this month as part of a annual visit and was found to have a hemoglobin of 11.5 hematocrit of 34.7 MCV of 71 and Serum iron 42 ferritin 6.5 B12 greater than 1500. Reviewing her chart hemoglobin was 13.81 year ago. Exline Patient tells me that she did have a rectal exam done as part of for annual physical and was told she was Hemoccult negative. She has not been taking any regular aspirin or NSAIDs. She has absolutely no GI symptoms. She says she is fatigued but has not noticed any changes in the past several months. No melena or hematochezia and no changes in her bowel habits. Appetite has been good and weight has been stable over the past few months no heartburn or indigestion. She just started on an over-the-counter iron tablet last week. She says she has been against colonoscopy and declined having colonoscopy when it was recommended previously for screening. She is interested in noninvasive methods of screening at this time.  Review of Systems Pertinent positive and negative review of systems were noted in the above HPI section.  All other review of systems was otherwise negative.  Outpatient Encounter Prescriptions as of 06/22/2015  Medication Sig  . albuterol (PROVENTIL HFA;VENTOLIN HFA) 108 (90 BASE) MCG/ACT inhaler Inhale 2 puffs into the lungs every 6 (six) hours as needed.    Marland Kitchen aspirin 81 MG tablet Take 1 tablet (81 mg total) by mouth daily.  . BucAlfAspKGlucCouchParsUvaUrJu (WATER PILLS PO) Take by mouth. Vegetarian formula  . buPROPion (WELLBUTRIN) 100 MG tablet TAKE 3 TABLETs (100 MG TOTAL)  DAILY.  . cetirizine (ZYRTEC) 10 MG tablet Take 10 mg by mouth daily.  . Cyanocobalamin 1000 MCG SUBL Place 1 tablet (1,000 mcg total) under the tongue daily.  Marland Kitchen escitalopram (LEXAPRO) 20 MG tablet Take 1 tablet (20 mg total) by mouth daily.  . Multiple Vitamins-Minerals ([facility] DAILY MULTIPLE PLUS MIN) TABS TAKE 1 TABLET BY MOUTH DAILY.  Marland Kitchen Omega-3 Fatty Acids (OMEGA 3 PO) Take 1,200 mg by mouth daily. 684 mg, EPA 410, DHA 274  . PRESCRIPTION MEDICATION Vision support  . vitamin C (ASCORBIC ACID) 500 MG tablet Take 500 mg by mouth daily.  Marland Kitchen FeFum-FePoly-FA-B Cmp-C-Biot (INTEGRA PLUS) CAPS Take 1 capsule by mouth daily.  . [DISCONTINUED] Omega-3 Fatty Acids (FISH OIL) 1200 MG CAPS Take by mouth.   No facility-administered encounter medications on file as of 06/22/2015.   No Known Allergies Patient Active Problem List   Diagnosis Date Noted  . Routine general medical examination at a health care facility 06/19/2015  . Anemia 06/19/2015  . INCONTINENCE 07/31/2009  . VIRAL URI 04/20/2008  . Venous (peripheral) insufficiency 03/07/2008  . MORBID OBESITY 02/01/2008  . Hyperlipidemia 12/29/2007  . Depressive disorder 12/29/2007  . OSTEOARTHRITIS 11/05/2006   Social History   Social History  . Marital  Status: Divorced    Spouse Name: N/A  . Number of Children: 2  . Years of Education: N/A   Occupational History  . customer relationship Mining engineermanager     [employer]   Social History Main Topics  . Smoking status: Former Smoker    Types: Cigarettes    Quit date: 03/26/1975  . Smokeless tobacco: Never Used  . Alcohol Use: 0.0 oz/week    0 Standard drinks or equivalent per week     Comment: social  . Drug Use: No  . Sexual Activity: Not on file   Other Topics  Concern  . Not on file   Social History Narrative    Jacqueline Hawkins's family history includes Breast cancer in her maternal aunt; Diabetes in her father; Heart disease in her father; Thyroid disease in her mother.      Objective:    Filed Vitals:   06/22/15 1440  BP: 108/80  Pulse: 92    Physical Exam  well-developed obese white female in no acute distress, pleasant blood pressure 108/80 pulse 92 height 5 foot 2 weight 231, BMI 41.9. HEENT; nontraumatic normocephalic EOMI PERRLA sclera anicteric, Cardiovascular ;regular rate and rhythm with S1-S2 no murmur rub or gallop, Pulmonary ;clear bilaterally, Abdomen; obese soft nontender nondistended bowel sounds are active there is no palpable mass or hepatosplenomegaly, Rectal; exam not done, Ext; no clubbing cyanosis or edema skin warm and dry, Neuropsych; mood and affect appropriate     Assessment & Plan:   #1 60  yo female  With new iron deficiency anemia. Etiology is not clear we'll need to rule out occult intermittent GI blood loss rule out anastomotic friabilityrule out occult colon neoplasm, r/o malabsorption secondary to gastric bypass #2 morbid obesity-that is post Roux-en-Y gastric bypass 2010, with weight loss and 150 pounds to date #3 hyperlipidemia #4 depression  Plan; long discussion with patient regarding iron deficiency anemia and indications for endoscopic evaluation versus any noninvasive screening modality. She was interested in Cologuard , however after detailed explanation she is agreeable to proceeding with colonoscopy and EGD. Procedures were discussed in detail with patient. She is scheduled with Dr. Adela LankArmbruster Will start Integra Plus ,one daily for prescription iron supplementation for more adequate replacement She will need to have repeat hemoglobin in a couple of weeks and repeat iron studies in 3-4 months  Amy S Esterwood PA-C 06/22/2015   Cc: Roderick Peeodd, Jeffrey A, MD

## 2015-06-22 NOTE — Patient Instructions (Signed)
You have been scheduled for an endoscopy and colonoscopy. Please follow the written instructions given to you at your visit today. Please pick up your prep supplies at the pharmacy within the next 1-3 days. If you use inhalers (even only as needed), please bring them with you on the day of your procedure. Your physician has requested that you go to www.startemmi.com and enter the access code given to you at your visit today. This web site gives a general overview about your procedure. However, you should still follow specific instructions given to you by our office regarding your preparation for the procedure.  We sent a prescription for Integra Plus iron supplements.            If you are age 60 or younger, your body mass index should be between 19-25. Your Body mass index is 41.94 kg/(m^2). If this is out of the aformentioned range listed, please consider follow up with your Primary Care Provider.

## 2015-06-23 NOTE — Progress Notes (Signed)
Agree with assessment and plan as outlined. Given she has had no prior CRC screening and her age agree with colonoscopy and upper endoscopy, however this may simply be related to her post-gastric bypass state, in this setting is it not uncommon to develop iron deficiency. Will await endoscopic evaluation with further recommendations.

## 2015-07-13 ENCOUNTER — Other Ambulatory Visit: Payer: Self-pay | Admitting: *Deleted

## 2015-07-13 ENCOUNTER — Telehealth: Payer: Self-pay | Admitting: Physician Assistant

## 2015-07-13 MED ORDER — NA SULFATE-K SULFATE-MG SULF 17.5-3.13-1.6 GM/177ML PO SOLN: 1.0000 | Freq: Once | ORAL | Status: DC

## 2015-07-14 NOTE — Telephone Encounter (Signed)
Sent prescription for the [facility] to [facility] [address]

## 2015-07-21 ENCOUNTER — Other Ambulatory Visit: Payer: Self-pay | Admitting: *Deleted

## 2015-07-21 ENCOUNTER — Encounter: Payer: Self-pay | Admitting: Gastroenterology

## 2015-07-21 ENCOUNTER — Ambulatory Visit (AMBULATORY_SURGERY_CENTER): Payer: Managed Care, Other (non HMO) | Admitting: Gastroenterology

## 2015-07-21 VITALS — BP 136/90 | HR 86 | Temp 98.6°F | Resp 14 | Ht 62.0 in | Wt 231.0 lb

## 2015-07-21 DIAGNOSIS — K5730 Diverticulosis of large intestine without perforation or abscess without bleeding: Secondary | ICD-10-CM | POA: Diagnosis not present

## 2015-07-21 DIAGNOSIS — D509 Iron deficiency anemia, unspecified: Secondary | ICD-10-CM

## 2015-07-21 HISTORY — PX: COLONOSCOPY WITH ESOPHAGOGASTRODUODENOSCOPY (EGD): SHX5779

## 2015-07-21 MED ORDER — SODIUM CHLORIDE 0.9 % IV SOLN
500.0000 mL | INTRAVENOUS | Status: DC
Start: 2015-07-21 — End: 2015-07-21

## 2015-07-21 NOTE — Op Note (Signed)
[facility] Patient Name: Jacqueline MarinSusan Hawkins Procedure Date: 07/21/2015 1:20 PM MRN: [id number] Endoscopist: Viviann SpareSteven P. Adela LankArmbruster , MD Age: [age] Date of Birth: 30-Apr-1955 Gender: Female Procedure:                Upper GI endoscopy Indications:              Iron deficiency anemia, s/p gastric bypass Medicines:                Monitored Anesthesia Care Procedure:                Pre-Anesthesia Assessment:                           - Prior to the procedure, a History and Physical                            was performed, and patient medications and                            allergies were reviewed. The patient's tolerance of                            previous anesthesia was also reviewed. The risks                            and benefits of the procedure and the sedation                            options and risks were discussed with the patient.                            All questions were answered, and informed consent                            was obtained. Prior Anticoagulants: The patient has                            taken aspirin, last dose was 3 days prior to                            procedure. ASA Grade Assessment: III - A patient                            with severe systemic disease. After reviewing the                            risks and benefits, the patient was deemed in                            satisfactory condition to undergo the procedure.                           After obtaining informed consent, the endoscope was  passed under direct vision. Throughout the                            procedure, the patient's blood pressure, pulse, and                            oxygen saturations were monitored continuously. The                            Model GIF-HQ190 [id number]) scope was introduced                            through the mouth, and advanced to the jejunum. The                            upper GI endoscopy was accomplished  without                            difficulty. The patient tolerated the procedure                            well. Scope In: Scope Out: Findings:                 Esophagogastric landmarks were identified: the                            Z-line was found at 35 cm and the gastroesophageal                            junction was found at 35 cm from the incisors.                           The exam of the esophagus was otherwise normal.                           Evidence of a Roux-en-Y gastrojejunostomy was                            found. The gastrojejunal anastomosis was                            characterized by healthy appearing mucosa without                            stenosis or ulceration. This was traversed. The                            gastric pouch was normal, as was the blind llimb.                            Unclear if the patient had a hiatal hernia (3-4cm)                            vs.  an angulated turn in the gastric pouch. I could                            not retroflex within the pouch due to this turn as                            the area was small and may have been a hiatal                            hernia. No pathology noted in the gastric pouch to                            cause anemia.                           The examined jejunum was normal. Complications:            No immediate complications. Estimated blood loss:                            None. Estimated Blood Loss:     Estimated blood loss: none. Impression:               - Esophagogastric landmarks identified.                           - Roux-en-Y gastrojejunostomy with gastrojejunal                            anastomosis characterized by healthy appearing                            mucosa.                           - Normal examined jejunum.                           - No specimens collected.                           Overall, no evidence of pathology to account for                            iron  deficiency, which may be due to post-gastric                            bypass state Recommendation:           - Patient has a contact number available for                            emergencies. The signs and symptoms of potential                            delayed complications were discussed with the  patient. Return to normal activities tomorrow.                            Written discharge instructions were provided to the                            patient.                           - Resume previous diet.                           - Continue present medications including iron                            supplementation                           - No need repeat upper endoscopy.                           - Repeat CBC and iron studies in one month while on                            iron, if iron deficiency persists may consider                            capsule study. Would also consider stool studies in                            this setting to see if positive for blood. We also                            may consider IV iron therapy pending her course. Viviann Spare P. Yanet Balliet, MD 07/21/2015 2:11:27 PM This report has been signed electronically.

## 2015-07-21 NOTE — Op Note (Signed)
[facility] Patient Name: Jacqueline Hawkins Procedure Date: 07/21/2015 1:40 PM MRN: [id number] Endoscopist: Viviann Spare P. Adela Lank , MD Age: 60 Date of Birth: 30-May-1955 Gender: Female Procedure:                Colonoscopy Indications:              Unexplained iron deficiency anemia Medicines:                Monitored Anesthesia Care Procedure:                Pre-Anesthesia Assessment:                           - Prior to the procedure, a History and Physical                            was performed, and patient medications and                            allergies were reviewed. The patient's tolerance of                            previous anesthesia was also reviewed. The risks                            and benefits of the procedure and the sedation                            options and risks were discussed with the patient.                            All questions were answered, and informed consent                            was obtained. Prior Anticoagulants: The patient has                            taken aspirin, last dose was 1 day prior to                            procedure. ASA Grade Assessment: III - A patient                            with severe systemic disease. After reviewing the                            risks and benefits, the patient was deemed in                            satisfactory condition to undergo the procedure.                           After obtaining informed consent, the colonoscope  was passed under direct vision. Throughout the                            procedure, the patient's blood pressure, pulse, and                            oxygen saturations were monitored continuously. The                            Model PCF-H190DL [id number]) scope was introduced                            through the anus and advanced to the the terminal                            ileum, with identification of the appendiceal                     orifice and IC valve. The colonoscopy was performed                            without difficulty. The patient tolerated the                            procedure well. The quality of the bowel                            preparation was adequate. The terminal ileum,                            ileocecal valve, appendiceal orifice, and rectum                            were photographed. Scope In: 1:41:38 PM Scope Out: 2:00:25 PM Scope Withdrawal Time: 0 hours 13 minutes 37 seconds  Total Procedure Duration: 0 hours 18 minutes 47 seconds  Findings:                 The perianal and digital rectal examinations were                            normal.                           A few small-mouthed diverticula were found in the                            left colon.                           The terminal ileum appeared normal.                           The exam was otherwise without abnormality. Complications:            No immediate complications. Estimated blood loss:  None. Estimated Blood Loss:     Estimated blood loss: none. Impression:               - Diverticulosis in the left colon.                           - The examined portion of the ileum was normal.                           - The examination was otherwise normal.                           - No specimens collected.                           Overall no pathology on this exam to account for                            the patient's iron deficiency. Recommendation:           - Patient has a contact number available for                            emergencies. The signs and symptoms of potential                            delayed complications were discussed with the                            patient. Return to normal activities tomorrow.                            Written discharge instructions were provided to the                            patient.                           - Resume  previous diet.                           - Continue present medications.                           - Repeat colonoscopy in 10 years for screening                            purposes.                           - Repeat CBC with iron studies in 1 month to assess                            for interval improvement. Iron deficiency may                            likely be due  to post gastric bypass state.                            Consideration for capsule endoscopy if anemia                            persists. Viviann SpareSteven P. Adela LankArmbruster, MD 07/21/2015 2:05:21 PM This report has been signed electronically.

## 2015-07-21 NOTE — Patient Instructions (Signed)
Discharge instructions given. Normal exams. Handout on diverticulosis. Resume previous medications. YOU HAD AN ENDOSCOPIC PROCEDURE TODAY AT THE [facility]:   Refer to the procedure report that was given to you for any specific questions about what was found during the examination.  If the procedure report does not answer your questions, please call your gastroenterologist to clarify.  If you requested that your care partner not be given the details of your procedure findings, then the procedure report has been included in a sealed envelope for you to review at your convenience later.  YOU SHOULD EXPECT: Some feelings of bloating in the abdomen. Passage of more gas than usual.  Walking can help get rid of the air that was put into your GI tract during the procedure and reduce the bloating. If you had a lower endoscopy (such as a colonoscopy or flexible sigmoidoscopy) you may notice spotting of blood in your stool or on the toilet paper. If you underwent a bowel prep for your procedure, you may not have a normal bowel movement for a few days.  Please Note:  You might notice some irritation and congestion in your nose or some drainage.  This is from the oxygen used during your procedure.  There is no need for concern and it should clear up in a day or so.  SYMPTOMS TO REPORT IMMEDIATELY:   Following lower endoscopy (colonoscopy or flexible sigmoidoscopy):  Excessive amounts of blood in the stool  Significant tenderness or worsening of abdominal pains  Swelling of the abdomen that is new, acute  Fever of 100F or higher   Following upper endoscopy (EGD)  Vomiting of blood or coffee ground material  New chest pain or pain under the shoulder blades  Painful or persistently difficult swallowing  New shortness of breath  Fever of 100F or higher  Black, tarry-looking stools  For urgent or emergent issues, a gastroenterologist can be reached at any hour by calling [phone]  [phone].   DIET: Your first meal following the procedure should be a small meal and then it is ok to progress to your normal diet. Heavy or fried foods are harder to digest and may make you feel nauseous or bloated.  Likewise, meals heavy in dairy and vegetables can increase bloating.  Drink plenty of fluids but you should avoid alcoholic beverages for 24 hours.  ACTIVITY:  You should plan to take it easy for the rest of today and you should NOT DRIVE or use heavy machinery until tomorrow (because of the sedation medicines used during the test).    FOLLOW UP: Our staff will call the number listed on your records the next business day following your procedure to check on you and address any questions or concerns that you may have regarding the information given to you following your procedure. If we do not reach you, we will leave a message.  However, if you are feeling well and you are not experiencing any problems, there is no need to return our call.  We will assume that you have returned to your regular daily activities without incident.  If any biopsies were taken you will be contacted by phone or by letter within the next 1-3 weeks.  Please call us at [phone] if you have not heard about the biopsies in 3 weeks.    SIGNATURES/CONFIDENTIALITY: You and/or your care partner have signed paperwork which will be entered into your electronic medical record.  These signatures attest to the fact that that  the information above on your After Visit Summary has been reviewed and is understood.  Full responsibility of the confidentiality of this discharge information lies with you and/or your care-partner.

## 2015-07-21 NOTE — Progress Notes (Signed)
To recovery, report to McCoy, RN, VSS 

## 2015-07-24 ENCOUNTER — Telehealth: Payer: Self-pay | Admitting: *Deleted

## 2015-07-24 NOTE — Telephone Encounter (Signed)
  Follow up Call-  Call back number 07/21/2015  Post procedure Call Back phone  # [phone]  Permission to leave phone message Yes     Patient questions:  Do you have a fever, pain , or abdominal swelling? No. Pain Score  0 *  Have you tolerated food without any problems? Yes.    Have you been able to return to your normal activities? Yes.    Do you have any questions about your discharge instructions: Diet   No. Medications  No. Follow up visit  Yes.    Do you have questions or concerns about your Care? No.  Actions: * If pain score is 4 or above: No action needed, pain <4.

## 2015-08-17 ENCOUNTER — Telehealth: Payer: Self-pay | Admitting: *Deleted

## 2015-08-17 NOTE — Telephone Encounter (Signed)
-----   Message from Daphine Deutscheregina N Mattison Golay, RN sent at 07/21/2015  3:18 PM EDT ----- Call and remind patient due for las for SA on 08/21/15. Labs in EPIC.

## 2015-08-17 NOTE — Telephone Encounter (Signed)
Left a message for patient to call back. 

## 2015-08-18 ENCOUNTER — Encounter: Payer: Self-pay | Admitting: *Deleted

## 2015-08-18 NOTE — Telephone Encounter (Signed)
Mailed a letter to patient reminding her that labs are due.

## 2015-08-25 ENCOUNTER — Other Ambulatory Visit (INDEPENDENT_AMBULATORY_CARE_PROVIDER_SITE_OTHER): Payer: Managed Care, Other (non HMO)

## 2015-08-25 DIAGNOSIS — D509 Iron deficiency anemia, unspecified: Secondary | ICD-10-CM | POA: Diagnosis not present

## 2015-08-25 LAB — CBC WITH DIFFERENTIAL/PLATELET
BASOS ABS: 0 10*3/uL (ref 0.0–0.1)
Basophils Relative: 0.3 % (ref 0.0–3.0)
EOS ABS: 0.1 10*3/uL (ref 0.0–0.7)
Eosinophils Relative: 2.4 % (ref 0.0–5.0)
HEMATOCRIT: 40.4 % (ref 36.0–46.0)
HEMOGLOBIN: 13.5 g/dL (ref 12.0–15.0)
LYMPHS PCT: 37.3 % (ref 12.0–46.0)
Lymphs Abs: 2 10*3/uL (ref 0.7–4.0)
MCHC: 33.4 g/dL (ref 30.0–36.0)
MCV: 85.8 fl (ref 78.0–100.0)
MONOS PCT: 9.1 % (ref 3.0–12.0)
Monocytes Absolute: 0.5 10*3/uL (ref 0.1–1.0)
Neutro Abs: 2.8 10*3/uL (ref 1.4–7.7)
Neutrophils Relative %: 50.9 % (ref 43.0–77.0)
Platelets: 272 10*3/uL (ref 150.0–400.0)
RBC: 4.71 Mil/uL (ref 3.87–5.11)
RDW: 22 % — ABNORMAL HIGH (ref 11.5–15.5)
WBC: 5.5 10*3/uL (ref 4.0–10.5)

## 2015-08-25 LAB — IBC PANEL
Iron: 77 ug/dL (ref 42–145)
SATURATION RATIOS: 15.8 % — AB (ref 20.0–50.0)
TRANSFERRIN: 348 mg/dL (ref 212.0–360.0)

## 2015-08-25 LAB — FERRITIN: FERRITIN: 13.5 ng/mL (ref 10.0–291.0)

## 2015-08-25 LAB — FOLATE

## 2015-08-28 ENCOUNTER — Encounter: Payer: Self-pay | Admitting: Physician Assistant

## 2015-08-28 ENCOUNTER — Other Ambulatory Visit: Payer: Self-pay | Admitting: *Deleted

## 2015-08-28 DIAGNOSIS — D509 Iron deficiency anemia, unspecified: Secondary | ICD-10-CM

## 2015-08-30 ENCOUNTER — Encounter: Payer: Self-pay | Admitting: Physician Assistant

## 2015-11-05 ENCOUNTER — Other Ambulatory Visit: Payer: Self-pay | Admitting: Family Medicine

## 2015-11-06 NOTE — Telephone Encounter (Signed)
Rx refill sent to pharmacy. 

## 2015-12-18 ENCOUNTER — Other Ambulatory Visit (INDEPENDENT_AMBULATORY_CARE_PROVIDER_SITE_OTHER): Payer: Managed Care, Other (non HMO)

## 2015-12-18 DIAGNOSIS — D509 Iron deficiency anemia, unspecified: Secondary | ICD-10-CM

## 2015-12-18 LAB — CBC WITH DIFFERENTIAL/PLATELET
BASOS PCT: 0.4 % (ref 0.0–3.0)
Basophils Absolute: 0 10*3/uL (ref 0.0–0.1)
Eosinophils Absolute: 0.1 10*3/uL (ref 0.0–0.7)
Eosinophils Relative: 2.2 % (ref 0.0–5.0)
HEMATOCRIT: 42.2 % (ref 36.0–46.0)
Hemoglobin: 14.4 g/dL (ref 12.0–15.0)
LYMPHS PCT: 42.2 % (ref 12.0–46.0)
Lymphs Abs: 2 10*3/uL (ref 0.7–4.0)
MCHC: 34.2 g/dL (ref 30.0–36.0)
MCV: 93.5 fl (ref 78.0–100.0)
MONOS PCT: 10.9 % (ref 3.0–12.0)
Monocytes Absolute: 0.5 10*3/uL (ref 0.1–1.0)
NEUTROS ABS: 2.1 10*3/uL (ref 1.4–7.7)
Neutrophils Relative %: 44.3 % (ref 43.0–77.0)
PLATELETS: 241 10*3/uL (ref 150.0–400.0)
RBC: 4.51 Mil/uL (ref 3.87–5.11)
RDW: 16.8 % — AB (ref 11.5–15.5)
WBC: 4.8 10*3/uL (ref 4.0–10.5)

## 2015-12-18 LAB — HEMOCCULT SLIDES (X 3 CARDS)
FECAL OCCULT BLD: NEGATIVE
OCCULT 1: NEGATIVE
OCCULT 2: NEGATIVE
OCCULT 3: NEGATIVE
OCCULT 4: NEGATIVE
OCCULT 5: NEGATIVE

## 2015-12-19 ENCOUNTER — Encounter: Payer: Self-pay | Admitting: Gastroenterology

## 2015-12-19 ENCOUNTER — Other Ambulatory Visit: Payer: Self-pay

## 2015-12-19 ENCOUNTER — Other Ambulatory Visit: Payer: Self-pay | Admitting: Physician Assistant

## 2015-12-19 DIAGNOSIS — D649 Anemia, unspecified: Secondary | ICD-10-CM

## 2015-12-19 NOTE — Progress Notes (Signed)
Letter sent repeat lab work in 6 months.

## 2015-12-21 NOTE — Progress Notes (Signed)
Done 9/27

## 2015-12-27 ENCOUNTER — Encounter: Payer: Self-pay | Admitting: Physician Assistant

## 2016-03-20 ENCOUNTER — Encounter (HOSPITAL_COMMUNITY): Payer: Self-pay

## 2016-04-01 ENCOUNTER — Encounter: Payer: Self-pay | Admitting: Family Medicine

## 2016-04-21 ENCOUNTER — Other Ambulatory Visit: Payer: Self-pay | Admitting: Physician Assistant

## 2016-05-29 ENCOUNTER — Encounter: Payer: Self-pay | Admitting: Family Medicine

## 2016-07-22 ENCOUNTER — Encounter: Payer: Self-pay | Admitting: Family Medicine

## 2016-08-08 ENCOUNTER — Encounter: Payer: Self-pay | Admitting: Family Medicine

## 2016-08-08 ENCOUNTER — Ambulatory Visit (INDEPENDENT_AMBULATORY_CARE_PROVIDER_SITE_OTHER): Payer: 59 | Admitting: Family Medicine

## 2016-08-08 VITALS — BP 100/78 | HR 76 | Temp 98.3°F | Ht 62.0 in | Wt 233.2 lb

## 2016-08-08 DIAGNOSIS — F32A Depression, unspecified: Secondary | ICD-10-CM

## 2016-08-08 DIAGNOSIS — Z0000 Encounter for general adult medical examination without abnormal findings: Secondary | ICD-10-CM | POA: Diagnosis not present

## 2016-08-08 DIAGNOSIS — M170 Bilateral primary osteoarthritis of knee: Secondary | ICD-10-CM

## 2016-08-08 DIAGNOSIS — F329 Major depressive disorder, single episode, unspecified: Secondary | ICD-10-CM | POA: Diagnosis not present

## 2016-08-08 DIAGNOSIS — E785 Hyperlipidemia, unspecified: Secondary | ICD-10-CM

## 2016-08-08 DIAGNOSIS — Z1159 Encounter for screening for other viral diseases: Secondary | ICD-10-CM

## 2016-08-08 DIAGNOSIS — Z7689 Persons encountering health services in other specified circumstances: Secondary | ICD-10-CM | POA: Diagnosis not present

## 2016-08-08 DIAGNOSIS — Z8639 Personal history of other endocrine, nutritional and metabolic disease: Secondary | ICD-10-CM

## 2016-08-08 DIAGNOSIS — Z862 Personal history of diseases of the blood and blood-forming organs and certain disorders involving the immune mechanism: Secondary | ICD-10-CM

## 2016-08-08 LAB — CBC WITH DIFFERENTIAL/PLATELET
BASOS ABS: 0 10*3/uL (ref 0.0–0.1)
Basophils Relative: 0.7 % (ref 0.0–3.0)
EOS ABS: 0.1 10*3/uL (ref 0.0–0.7)
Eosinophils Relative: 2.7 % (ref 0.0–5.0)
HCT: 42.2 % (ref 36.0–46.0)
Hemoglobin: 14.1 g/dL (ref 12.0–15.0)
LYMPHS ABS: 1.9 10*3/uL (ref 0.7–4.0)
Lymphocytes Relative: 47.4 % — ABNORMAL HIGH (ref 12.0–46.0)
MCHC: 33.3 g/dL (ref 30.0–36.0)
MCV: 97.9 fl (ref 78.0–100.0)
MONOS PCT: 8 % (ref 3.0–12.0)
Monocytes Absolute: 0.3 10*3/uL (ref 0.1–1.0)
NEUTROS ABS: 1.6 10*3/uL (ref 1.4–7.7)
NEUTROS PCT: 41.2 % — AB (ref 43.0–77.0)
PLATELETS: 236 10*3/uL (ref 150.0–400.0)
RBC: 4.31 Mil/uL (ref 3.87–5.11)
RDW: 14.7 % (ref 11.5–15.5)
WBC: 3.9 10*3/uL — ABNORMAL LOW (ref 4.0–10.5)

## 2016-08-08 LAB — POCT URINALYSIS DIPSTICK
Bilirubin, UA: NEGATIVE
Blood, UA: NEGATIVE
Glucose, UA: NEGATIVE
KETONES UA: NEGATIVE
LEUKOCYTES UA: NEGATIVE
NITRITE UA: NEGATIVE
PH UA: 6 (ref 5.0–8.0)
PROTEIN UA: NEGATIVE
Spec Grav, UA: 1.01 (ref 1.010–1.025)
Urobilinogen, UA: 0.2 E.U./dL

## 2016-08-08 LAB — HEMOGLOBIN A1C: HEMOGLOBIN A1C: 5.7 % (ref 4.6–6.5)

## 2016-08-08 LAB — LIPID PANEL
CHOLESTEROL: 211 mg/dL — AB (ref 0–200)
HDL: 67.7 mg/dL (ref >39.00)
LDL CALC: 129 mg/dL — AB (ref 0–99)
NonHDL: 143.31
TRIGLYCERIDES: 70 mg/dL (ref 0.0–149.0)
Total CHOL/HDL Ratio: 3
VLDL: 14 mg/dL (ref 0.0–40.0)

## 2016-08-08 LAB — HEPATIC FUNCTION PANEL
ALT: 20 U/L (ref 0–35)
AST: 20 U/L (ref 0–37)
Albumin: 4.1 g/dL (ref 3.5–5.2)
Alkaline Phosphatase: 61 U/L (ref 39–117)
BILIRUBIN TOTAL: 0.4 mg/dL (ref 0.2–1.2)
Bilirubin, Direct: 0.1 mg/dL (ref 0.0–0.3)
Total Protein: 6.5 g/dL (ref 6.0–8.3)

## 2016-08-08 LAB — BASIC METABOLIC PANEL
BUN: 15 mg/dL (ref 6–23)
CHLORIDE: 104 meq/L (ref 96–112)
CO2: 29 mEq/L (ref 19–32)
Calcium: 9.3 mg/dL (ref 8.4–10.5)
Creatinine, Ser: 0.68 mg/dL (ref 0.40–1.20)
GFR: 93.58 mL/min (ref >60.00)
Glucose, Bld: 82 mg/dL (ref 70–99)
POTASSIUM: 4.3 meq/L (ref 3.5–5.1)
SODIUM: 139 meq/L (ref 135–145)

## 2016-08-08 LAB — TSH: TSH: 2.72 u[IU]/mL (ref 0.35–4.50)

## 2016-08-08 MED ORDER — BUPROPION HCL 100 MG PO TABS: ORAL_TABLET | ORAL | 3 refills | Status: DC

## 2016-08-08 MED ORDER — ESCITALOPRAM OXALATE 20 MG PO TABS: 20.0000 mg | ORAL_TABLET | Freq: Every day | ORAL | 1 refills | Status: DC

## 2016-08-08 MED ORDER — INTEGRA PLUS PO CAPS: 1.0000 | ORAL_CAPSULE | Freq: Every day | ORAL | 3 refills | Status: DC
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
## 2016-08-08 NOTE — Patient Instructions (Addendum)
It was a pleasure to meet you today! We have ordered labs or studies at this visit. It can take up to 1-2 weeks for results and processing. IF results require follow up or explanation, we will call you with instructions. Clinically stable results will be released to your [facility]. If you have not heard from [place] or cannot find your results in [facility] in 2 weeks please contact our office at [phone].  If you are not yet signed up for [facility], please consider signing up   Health Maintenance, Female Adopting a healthy lifestyle and getting preventive care can go a long way to promote health and wellness. Talk with your health care provider about what schedule of regular examinations is right for you. This is a good chance for you to check in with your provider about disease prevention and staying healthy. In between checkups, there are plenty of things you can do on your own. Experts have done a lot of research about which lifestyle changes and preventive measures are most likely to keep you healthy. Ask your health care provider for more information. Weight and diet Eat a healthy diet  Be sure to include plenty of vegetables, fruits, low-fat dairy products, and lean protein.  Do not eat a lot of foods high in solid fats, added sugars, or salt.  Get regular exercise. This is one of the most important things you can do for your health.  Most adults should exercise for at least 150 minutes each week. The exercise should increase your heart rate and make you sweat (moderate-intensity exercise).  Most adults should also do strengthening exercises at least twice a week. This is in addition to the moderate-intensity exercise. Maintain a healthy weight  Body mass index (BMI) is a measurement that can be used to identify possible weight problems. It estimates body fat based on height and weight. Your health care provider can help determine your BMI and help you achieve or maintain a healthy weight.  For  females 62 years of age and older:  A BMI below 18.5 is considered underweight.  A BMI of 18.5 to 24.9 is normal.  A BMI of 25 to 29.9 is considered overweight.  A BMI of 30 and above is considered obese. Watch levels of cholesterol and blood lipids  You should start having your blood tested for lipids and cholesterol at 62 years of age, then have this test every 5 years.  You may need to have your cholesterol levels checked more often if:  Your lipid or cholesterol levels are high.  You are older than 61 years of age.  You are at high risk for heart disease. Cancer screening Lung Cancer  Lung cancer screening is recommended for adults 77-66 years old who are at high risk for lung cancer because of a history of smoking.  A yearly low-dose CT scan of the lungs is recommended for people who:  Currently smoke.  Have quit within the past 15 years.  Have at least a 30-pack-year history of smoking. A pack year is smoking an average of one pack of cigarettes a day for 1 year.  Yearly screening should continue until it has been 15 years since you quit.  Yearly screening should stop if you develop a health problem that would prevent you from having lung cancer treatment. Breast Cancer  Practice breast self-awareness. This means understanding how your breasts normally appear and feel.  It also means doing regular breast self-exams. Let your health care provider know about  any changes, no matter how small.  If you are in your 20s or 30s, you should have a clinical breast exam (CBE) by a health care provider every 1-3 years as part of a regular health exam.  If you are 40 or older, have a CBE every year. Also consider having a breast X-ray (mammogram) every year.  If you have a family history of breast cancer, talk to your health care provider about genetic screening.  If you are at high risk for breast cancer, talk to your health care provider about having an MRI and a mammogram  every year.  Breast cancer gene (BRCA) assessment is recommended for women who have family members with BRCA-related cancers. BRCA-related cancers include:  Breast.  Ovarian.  Tubal.  Peritoneal cancers.  Results of the assessment will determine the need for genetic counseling and BRCA1 and BRCA2 testing. Cervical Cancer  Your health care provider may recommend that you be screened regularly for cancer of the pelvic organs (ovaries, uterus, and vagina). This screening involves a pelvic examination, including checking for microscopic changes to the surface of your cervix (Pap test). You may be encouraged to have this screening done every 3 years, beginning at age 73.  For women ages 58-65, health care providers may recommend pelvic exams and Pap testing every 3 years, or they may recommend the Pap and pelvic exam, combined with testing for human papilloma virus (HPV), every 5 years. Some types of HPV increase your risk of cervical cancer. Testing for HPV may also be done on women of any age with unclear Pap test results.  Other health care providers may not recommend any screening for nonpregnant women who are considered low risk for pelvic cancer and who do not have symptoms. Ask your health care provider if a screening pelvic exam is right for you.  If you have had past treatment for cervical cancer or a condition that could lead to cancer, you need Pap tests and screening for cancer for at least 20 years after your treatment. If Pap tests have been discontinued, your risk factors (such as having a new sexual partner) need to be reassessed to determine if screening should resume. Some women have medical problems that increase the chance of getting cervical cancer. In these cases, your health care provider may recommend more frequent screening and Pap tests. Colorectal Cancer  This type of cancer can be detected and often prevented.  Routine colorectal cancer screening usually begins at 61  years of age and continues through 61 years of age.  Your health care provider may recommend screening at an earlier age if you have risk factors for colon cancer.  Your health care provider may also recommend using home test kits to check for hidden blood in the stool.  A small camera at the end of a tube can be used to examine your colon directly (sigmoidoscopy or colonoscopy). This is done to check for the earliest forms of colorectal cancer.  Routine screening usually begins at age 70.  Direct examination of the colon should be repeated every 5-10 years through 61 years of age. However, you may need to be screened more often if early forms of precancerous polyps or small growths are found. Skin Cancer  Check your skin from head to toe regularly.  Tell your health care provider about any new moles or changes in moles, especially if there is a change in a mole's shape or color.  Also tell your health care provider if you  have a mole that is larger than the size of a pencil eraser.  Always use sunscreen. Apply sunscreen liberally and repeatedly throughout the day.  Protect yourself by wearing long sleeves, pants, a wide-brimmed hat, and sunglasses whenever you are outside. Heart disease, diabetes, and high blood pressure  High blood pressure causes heart disease and increases the risk of stroke. High blood pressure is more likely to develop in:  People who have blood pressure in the high end of the normal range (130-139/85-89 mm Hg).  People who are overweight or obese.  People who are African American.  If you are 8-44 years of age, have your blood pressure checked every 3-5 years. If you are 3 years of age or older, have your blood pressure checked every year. You should have your blood pressure measured twice-once when you are at a hospital or clinic, and once when you are not at a hospital or clinic. Record the average of the two measurements. To check your blood pressure when  you are not at a hospital or clinic, you can use:  An automated blood pressure machine at a pharmacy.  A home blood pressure monitor.  If you are between 40 years and 81 years old, ask your health care provider if you should take aspirin to prevent strokes.  Have regular diabetes screenings. This involves taking a blood sample to check your fasting blood sugar level.  If you are at a normal weight and have a low risk for diabetes, have this test once every three years after 61 years of age.  If you are overweight and have a high risk for diabetes, consider being tested at a younger age or more often. Preventing infection Hepatitis B  If you have a higher risk for hepatitis B, you should be screened for this virus. You are considered at high risk for hepatitis B if:  You were born in a country where hepatitis B is common. Ask your health care provider which countries are considered high risk.  Your parents were born in a high-risk country, and you have not been immunized against hepatitis B (hepatitis B vaccine).  You have HIV or AIDS.  You use needles to inject street drugs.  You live with someone who has hepatitis B.  You have had sex with someone who has hepatitis B.  You get hemodialysis treatment.  You take certain medicines for conditions, including cancer, organ transplantation, and autoimmune conditions. Hepatitis C  Blood testing is recommended for:  Everyone born from [birth year] through 1965.  Anyone with known risk factors for hepatitis C. Sexually transmitted infections (STIs)  You should be screened for sexually transmitted infections (STIs) including gonorrhea and chlamydia if:  You are sexually active and are younger than 61 years of age.  You are older than 61 years of age and your health care provider tells you that you are at risk for this type of infection.  Your sexual activity has changed since you were last screened and you are at an increased risk for  chlamydia or gonorrhea. Ask your health care provider if you are at risk.  If you do not have HIV, but are at risk, it may be recommended that you take a prescription medicine daily to prevent HIV infection. This is called pre-exposure prophylaxis (PrEP). You are considered at risk if:  You are sexually active and do not regularly use condoms or know the HIV status of your partner(s).  You take drugs by injection.  You are  sexually active with a partner who has HIV. Talk with your health care provider about whether you are at high risk of being infected with HIV. If you choose to begin PrEP, you should first be tested for HIV. You should then be tested every 3 months for as long as you are taking PrEP. Pregnancy  If you are premenopausal and you may become pregnant, ask your health care provider about preconception counseling.  If you may become pregnant, take 400 to 800 micrograms (mcg) of folic acid every day.  If you want to prevent pregnancy, talk to your health care provider about birth control (contraception). Osteoporosis and menopause  Osteoporosis is a disease in which the bones lose minerals and strength with aging. This can result in serious bone fractures. Your risk for osteoporosis can be identified using a bone density scan.  If you are 59 years of age or older, or if you are at risk for osteoporosis and fractures, ask your health care provider if you should be screened.  Ask your health care provider whether you should take a calcium or vitamin D supplement to lower your risk for osteoporosis.  Menopause may have certain physical symptoms and risks.  Hormone replacement therapy may reduce some of these symptoms and risks. Talk to your health care provider about whether hormone replacement therapy is right for you. Follow these instructions at home:  Schedule regular health, dental, and eye exams.  Stay current with your immunizations.  Do not use any tobacco products  including cigarettes, chewing tobacco, or electronic cigarettes.  If you are pregnant, do not drink alcohol.  If you are breastfeeding, limit how much and how often you drink alcohol.  Limit alcohol intake to no more than 1 drink per day for nonpregnant women. One drink equals 12 ounces of beer, 5 ounces of wine, or 1 ounces of hard liquor.  Do not use street drugs.  Do not share needles.  Ask your health care provider for help if you need support or information about quitting drugs.  Tell your health care provider if you often feel depressed.  Tell your health care provider if you have ever been abused or do not feel safe at home. This information is not intended to replace advice given to you by your health care provider. Make sure you discuss any questions you have with your health care provider. Document Released: 09/24/2010 Document Revised: 08/17/2015 Document Reviewed: 12/13/2014 Elsevier Interactive Patient Education  2017 [employer] NOW OFFER   [facility] FAST TRACK!!!  SAME DAY Appointments for ACUTE CARE  Such as: Sprains, Injuries, cuts, abrasions, rashes, muscle pain, joint pain, back pain Colds, flu, sore throats, headache, allergies, cough, fever  Ear pain, sinus and eye infections Abdominal pain, nausea, vomiting, diarrhea, upset stomach Animal/insect bites  3 Easy Ways to Schedule: Walk-In Scheduling Call in scheduling Mychart Sign-up: [email or web address]

## 2016-08-09 LAB — HEPATITIS C ANTIBODY: HCV Ab: NEGATIVE

## 2016-09-01 ENCOUNTER — Other Ambulatory Visit: Payer: Self-pay | Admitting: Family Medicine

## 2016-10-08 ENCOUNTER — Telehealth: Payer: Self-pay | Admitting: Family Medicine

## 2016-10-08 NOTE — Telephone Encounter (Signed)
Pt would like to see if you would fill out a handicap placard form and would like to see if she cold pick it up next week.  Pt is aware that there may be a charge for the form.

## 2016-10-09 NOTE — Telephone Encounter (Signed)
Are you ok with this? 

## 2016-10-10 NOTE — Telephone Encounter (Signed)
Patient contacted and aware parking pass is ready for pick up

## 2016-10-10 NOTE — Telephone Encounter (Signed)
Yes, that is fine. 

## 2016-10-10 NOTE — Telephone Encounter (Signed)
Can you complete this and have Raynelle FanningJulie sign today?

## 2016-10-21 ENCOUNTER — Other Ambulatory Visit (HOSPITAL_COMMUNITY)
Admission: RE | Admit: 2016-10-21 | Discharge: 2016-10-21 | Disposition: A | Discharge status: Home / Self Care | Payer: 59 | Source: Ambulatory Visit | Attending: Family Medicine | Admitting: Family Medicine

## 2016-10-21 ENCOUNTER — Ambulatory Visit (INDEPENDENT_AMBULATORY_CARE_PROVIDER_SITE_OTHER): Payer: 59 | Admitting: Family Medicine

## 2016-10-21 ENCOUNTER — Encounter: Payer: Self-pay | Admitting: Family Medicine

## 2016-10-21 VITALS — BP 106/74 | HR 88 | Temp 98.7°F | Wt 235.0 lb

## 2016-10-21 DIAGNOSIS — Z124 Encounter for screening for malignant neoplasm of cervix: Secondary | ICD-10-CM

## 2016-10-21 DIAGNOSIS — E785 Hyperlipidemia, unspecified: Secondary | ICD-10-CM

## 2016-10-21 NOTE — Patient Instructions (Addendum)
It was a pleasure to see you today.  Your results will be communicated to you by phone.   Please focus on dietary changes to improve cholesterol level as discussed with recent lab work.  Follow up for yearly exam or sooner if needed.

## 2016-10-21 NOTE — Progress Notes (Signed)
Subjective:    Patient ID: Vernell LeepSusan T Karn, female    DOB: 08-06-1955, 61 y.o.   MRN: [id number]  HPI  Ms. Kassie MendsMounce is a 61 year old female who presents today for routine pap and breast exam. Last pap on 06/19/15 negative for malignancy; positive for HPV. She denies vaginal discharge, pain, lesions, or bleeding. She is not sexually active and is postmenopausal.   Last mammogram 05/29/16 negative for malignancy; yearly screening recommended. She completes monthly breast exams and denies any changes in breast or any unusual lumps or nipple discharge.  Hyperlipidemia: Lab work from 08/08/16 noted elevated cholesterol. LDL was 129 and HDL was 67.70.  Advised heart healthy diet and monitoring for fats and sweets with recommended weight loss. She reports focusing on dietary changes however does not follow any particular exercise program.  Wt Readings from Last 3 Encounters:  10/21/16 235 lb (106.6 kg)  08/08/16 233 lb 3.2 oz (105.8 kg)  07/21/15 231 lb (104.8 kg)   Weight has not decreased in 2 months and she is aware of this and states that she will continue to monitor fats and sweets as she has not done so during the past 2 months.   Review of Systems  Constitutional: Negative for chills, fatigue and fever.  Respiratory: Negative for cough, shortness of breath and wheezing.   Cardiovascular: Negative for chest pain and palpitations.  Gastrointestinal: Negative for abdominal pain, constipation, diarrhea, nausea and vomiting.  Genitourinary: Negative for dysuria.  Musculoskeletal: Negative for myalgias.  Neurological: Negative for dizziness, weakness, light-headedness, numbness and headaches.  Psychiatric/Behavioral:       Denies depressed or anxious mood     Past Medical History:  Diagnosis Date  . Asthma   . Depression   . Diabetes mellitus    no meds, AIC's normal since gastric bypass  . Hyperlipidemia   . OA (osteoarthritis)   . Obesity   . Venous insufficiency      Social  History   Social History  . Marital status: Divorced    Spouse name: N/A  . Number of children: 2  . Years of education: N/A   Occupational History  . customer relationship Mining engineermanager     [employer]   Social History Main Topics  . Smoking status: Former Smoker    Types: Cigarettes    Quit date: 03/26/1975  . Smokeless tobacco: Never Used  . Alcohol use 0.6 oz/week    1 Cans of beer per week     Comment: social  . Drug use: No  . Sexual activity: No   Other Topics Concern  . Not on file   Social History Narrative  . No narrative on file    Past Surgical History:  Procedure Laterality Date  . GASTRIC BYPASS    . TOTAL HIP ARTHROPLASTY Left     Family History  Problem Relation Age of Onset  . Diabetes Father   . Heart disease Father   . Thyroid disease Mother   . Breast cancer Maternal Aunt     No Known Allergies  Current Outpatient Prescriptions on File Prior to Visit  Medication Sig Dispense Refill  . albuterol (PROVENTIL HFA;VENTOLIN HFA) 108 (90 BASE) MCG/ACT inhaler Inhale 2 puffs into the lungs every 6 (six) hours as needed. 1 Inhaler 0  . aspirin 81 MG tablet Take 1 tablet (81 mg total) by mouth daily. 90 tablet 3  . Bacillus Coagulans-Inulin (PROBIOTIC-PREBIOTIC) 1-250 BILLION-MG CAPS Take 1 capsule by mouth daily.    .Marland Kitchen  BucAlfAspKGlucCouchParsUvaUrJu (WATER PILLS PO) Take by mouth. Vegetarian formula    . buPROPion (WELLBUTRIN) 100 MG tablet TAKE 3 TABLETs (100 MG TOTAL)  DAILY. 300 tablet 3  . cetirizine (ZYRTEC) 10 MG tablet Take 10 mg by mouth daily.    . Cyanocobalamin 1000 MCG SUBL Place 1 tablet (1,000 mcg total) under the tongue daily. 90 tablet 3  . escitalopram (LEXAPRO) 20 MG tablet Take 1 tablet (20 mg total) by mouth daily. 90 tablet 1  . FeFum-FePoly-FA-B Cmp-C-Biot (INTEGRA PLUS) CAPS Take 1 capsule by mouth daily. 30 capsule 3  . Multiple Vitamins-Minerals ([facility] DAILY MULTIPLE PLUS MIN) TABS TAKE 1 TABLET BY MOUTH DAILY. 90 tablet 3  .  Omega-3 Fatty Acids (OMEGA 3 PO) Take 1,200 mg by mouth daily. 684 mg, EPA 410, DHA 274    . PRESCRIPTION MEDICATION Vision support    . vitamin C (ASCORBIC ACID) 500 MG tablet Take 500 mg by mouth daily.     No current facility-administered medications on file prior to visit.     BP 106/74 (BP Location: Left Arm, Patient Position: Sitting, Cuff Size: Large)   Pulse 88   Temp 98.7 F (37.1 C) (Oral)   Wt 235 lb (106.6 kg)   LMP 06/11/2010   SpO2 97%   BMI 42.98 kg/m       Objective:   Physical Exam Physical Exam  Constitutional: She is oriented to person, place, and time. She appears well-developed and well-nourished; morbidly obese. No distress.  Cardiovascular: Normal rate and regular rhythm.   No murmur heard. Pulmonary/Chest: Effort normal and breath sounds normal. No respiratory distress. He has no wheezes. She has no rales. She exhibits no tenderness.  Abdominal: Soft. Bowel sounds are normal. She exhibits no distension and no mass. There is no tenderness. There is no rebound and no guarding.  Musculoskeletal: She exhibits no edema.  Lymphadenopathy:    She has no cervical adenopathy.  Neurological: She is alert and oriented to person, place, and time. SSkin: Skin is warm and dry.  Psychiatric: She has a normal mood and affect. Her behavior is normal. Judgment and thought content normal.  Breasts: Examined lying Right: Without masses, retractions, discharge or axillary adenopathy.  Left: Without masses, retractions, discharge or axillary adenopathy.  Inguinal/mons: Normal without inguinal adenopathy  External genitalia: Normal  BUS/Urethra/Skene's glands: Normal  Bladder: Normal  Vagina: Normal  Cervix: Normal  Uterus: normal in size, shape and contour. Midline and mobile  Adnexa/parametria:  Rt: Without masses or tenderness.  Lt: Without masses or tenderness.  Anus and perineum: Normal   Exam completed with chaperone: Mendel CorningNancy Kigotho, CMA    Assessment & Plan:    1. Pap smear for cervical cancer screening Completed; no suspicious findings with exam; last pap was positive for HPV and we reviewed the importance of screening as HPV is cause for cervical cancer. She will be notified with results by phone  2. Hyperlipidemia, unspecified hyperlipidemia type Patient would like to continue 6 month trial of dietary changes to improve cholesterol readings. We reviewed the importance of weight loss and following a heart healthy diet. She will follow up for yearly exam and is interested in having cholesterol rechecked in 6 months with trial of dietary changes.   Follow up for yearly exam in March 2019 and for cholesterol check in 6 months or sooner if needed.   Roddie McJulia Andjela Wickes, FNP-C

## 2016-10-22 LAB — CYTOLOGY - PAP
DIAGNOSIS: NEGATIVE
HPV: NOT DETECTED

## 2016-11-14 ENCOUNTER — Other Ambulatory Visit: Payer: Self-pay | Admitting: Family Medicine

## 2016-11-14 DIAGNOSIS — R05 Cough: Secondary | ICD-10-CM

## 2016-11-14 DIAGNOSIS — R059 Cough, unspecified: Secondary | ICD-10-CM

## 2016-11-15 ENCOUNTER — Encounter: Payer: Self-pay | Admitting: Family Medicine

## 2016-11-15 ENCOUNTER — Other Ambulatory Visit: Payer: Self-pay

## 2016-11-15 DIAGNOSIS — R05 Cough: Secondary | ICD-10-CM

## 2016-11-15 DIAGNOSIS — R059 Cough, unspecified: Secondary | ICD-10-CM

## 2016-11-15 MED ORDER — ALBUTEROL SULFATE HFA 108 (90 BASE) MCG/ACT IN AERS: 2.0000 | INHALATION_SPRAY | Freq: Four times a day (QID) | RESPIRATORY_TRACT | 1 refills | Status: DC | PRN

## 2016-11-15 NOTE — Telephone Encounter (Signed)
Spoke with Jacqueline Hawkins while in clinic today and she looked over chart and gave verbal to refill medication.  Rx was sent and MyChart message was sent to pt making her aware.

## 2016-11-22 ENCOUNTER — Telehealth: Payer: Self-pay | Admitting: Family Medicine

## 2016-11-22 NOTE — Telephone Encounter (Signed)
Yes.  This service should have been adjusted off.  I will send to charge correction again.  Thanks,  [place]

## 2016-11-22 NOTE — Telephone Encounter (Signed)
[employer] called on 8/30 stating they were calling on behalf of the patient who was stating she should not be charged for an office visit for 10/21/16.  I advised that I would speak to her provider first thing 11/22/16 and follow-up. I spoke with Raynelle FanningJulie who stated that she previously reached out to coding for assistance with this patient as there was a question about the way she would code the visit.  My understanding is that the patient was due for a PAP and it was not performed with the original visit so Raynelle FanningJulie brought the patient back in to perform.  Raynelle FanningJulie was under the impression that the office visit would not be charged.  Can you advise?

## 2016-12-12 ENCOUNTER — Encounter: Payer: Self-pay | Admitting: Family Medicine

## 2016-12-16 ENCOUNTER — Other Ambulatory Visit: Payer: Self-pay | Admitting: Family Medicine

## 2016-12-16 DIAGNOSIS — Z862 Personal history of diseases of the blood and blood-forming organs and certain disorders involving the immune mechanism: Secondary | ICD-10-CM

## 2016-12-20 NOTE — Telephone Encounter (Signed)
Please advise if ok to refill. 

## 2016-12-21 NOTE — Telephone Encounter (Signed)
Okay to provide one refill and advise to establish care with Dr. Salomon Fick or Dr. Swaziland

## 2016-12-23 ENCOUNTER — Encounter: Payer: Self-pay | Admitting: Family Medicine

## 2016-12-24 ENCOUNTER — Telehealth: Payer: Self-pay

## 2016-12-24 NOTE — Telephone Encounter (Signed)
Spoke with pt notified pt that she needs to establish with another provider since Amil Amen is working one Day a week, pt requested to transfer to Dr Swaziland, a request was sent to dr Swaziland. Refills were provided.

## 2016-12-24 NOTE — Telephone Encounter (Signed)
Spoke with pt explained that due to Jacqueline Hawkins working 1 day a week she needs to establish care with another dr at the practice, Pt was willing to transfer to Dr Swaziland and a phone note was sent to dr Swaziland in regards to pt request. Pt medication was send to her pharmacy for refill.

## 2016-12-24 NOTE — Telephone Encounter (Signed)
Fine with me, ?Thanks ?

## 2017-01-09 NOTE — Telephone Encounter (Signed)
Left message on Voice mail to call back. Not personalized, so did not leave a reason for the call.

## 2017-02-21 ENCOUNTER — Other Ambulatory Visit: Payer: Self-pay | Admitting: Family Medicine

## 2017-02-21 DIAGNOSIS — Z862 Personal history of diseases of the blood and blood-forming organs and certain disorders involving the immune mechanism: Secondary | ICD-10-CM

## 2017-02-24 ENCOUNTER — Other Ambulatory Visit: Payer: Self-pay | Admitting: Family Medicine

## 2017-02-24 DIAGNOSIS — Z862 Personal history of diseases of the blood and blood-forming organs and certain disorders involving the immune mechanism: Secondary | ICD-10-CM

## 2017-02-24 NOTE — Telephone Encounter (Signed)
See attached

## 2017-02-24 NOTE — Telephone Encounter (Unsigned)
Copied from CRM [id number]. Topic: Quick Communication - Rx Refill/Question >> Feb 24, 2017  8:30 AM Crist InfanteHarrald, Kathy J wrote: Pt request refill   FeFum-FePoly-FA-B Cmp-C-Biot (INTEGRA PLUS) CAPS  Pt has made est appt with Dr SwazilandJordan on 03/28/2017.  Cannot come in sooner due to work schedule  Preferred Pharmacy (with phone number or street name):   [id number] [place] [place] - [address] [phone] (Phone) [phone] (Fax)    Agent: Please be advised that RX refills may take up to 48 hours. We ask that you follow-up with your pharmacy.

## 2017-02-26 ENCOUNTER — Telehealth: Payer: Self-pay | Admitting: Family Medicine

## 2017-02-26 NOTE — Telephone Encounter (Signed)
Copied from CRM [id number]. Topic: Quick Communication - Rx Refill/Question >> Feb 24, 2017  8:30 AM Crist InfanteHarrald, Kathy J wrote: Pt request refill   FeFum-FePoly-FA-B Cmp-C-Biot (INTEGRA PLUS) CAPS  Pt has made est appt with Dr SwazilandJordan on 03/28/2017.  Cannot come in sooner due to work schedule  Preferred Pharmacy (with phone number or street name):   [id number] [place], [place] - [address] [phone] (Phone) [phone] (Fax)    Agent: Please be advised that RX refills may take up to 48 hours. We ask that you follow-up with your pharmacy.   Patient calling again today to get this med refilled

## 2017-02-26 NOTE — Telephone Encounter (Signed)
Pt has an appointment scheduled to establish care with you from Amil AmenJulia, pt is requesting for a refill for her INTEGRA , pt states that she is out and needs refill before she can come in to see you, please Advice

## 2017-02-27 ENCOUNTER — Encounter: Payer: Self-pay | Admitting: Family Medicine

## 2017-02-27 DIAGNOSIS — Z862 Personal history of diseases of the blood and blood-forming organs and certain disorders involving the immune mechanism: Secondary | ICD-10-CM

## 2017-02-27 NOTE — Telephone Encounter (Signed)
Pt had OV 08/08/16. Do not see that it is a med that [facility] can fill - routing back to office

## 2017-02-27 NOTE — Telephone Encounter (Signed)
Spoke with pt voiced understanding that her Rx request has been sent to Dr SwazilandJordan and I will call her soon the Rx is approved.

## 2017-02-28 MED ORDER — INTEGRA PLUS PO CAPS: 1.0000 | ORAL_CAPSULE | Freq: Every day | ORAL | 0 refills | Status: DC

## 2017-02-28 NOTE — Telephone Encounter (Signed)
Medication filled to pharmacy as requested.   

## 2017-02-28 NOTE — Telephone Encounter (Signed)
Will refill for one month until patient establishes with Dr. SwazilandJordan.

## 2017-02-28 NOTE — Addendum Note (Signed)
Addended by: Starla LinkAKLEY, Jese Comella J on: 02/28/2017 11:51 AM   Modules accepted: Orders

## 2017-03-12 ENCOUNTER — Encounter (HOSPITAL_COMMUNITY): Payer: Self-pay

## 2017-03-28 ENCOUNTER — Encounter: Payer: Self-pay | Admitting: Family Medicine

## 2017-03-28 ENCOUNTER — Ambulatory Visit (INDEPENDENT_AMBULATORY_CARE_PROVIDER_SITE_OTHER): Payer: 59 | Admitting: Family Medicine

## 2017-03-28 VITALS — BP 122/76 | HR 80 | Temp 98.8°F | Resp 12 | Ht 62.0 in | Wt 238.2 lb

## 2017-03-28 DIAGNOSIS — F325 Major depressive disorder, single episode, in full remission: Secondary | ICD-10-CM

## 2017-03-28 DIAGNOSIS — D509 Iron deficiency anemia, unspecified: Secondary | ICD-10-CM

## 2017-03-28 DIAGNOSIS — Z9884 Bariatric surgery status: Secondary | ICD-10-CM | POA: Insufficient documentation

## 2017-03-28 LAB — IRON: IRON: 82 ug/dL (ref 42–145)

## 2017-03-28 LAB — VITAMIN D 25 HYDROXY (VIT D DEFICIENCY, FRACTURES): VITD: 40.53 ng/mL (ref 30.00–100.00)

## 2017-03-28 LAB — VITAMIN B12: Vitamin B-12: >1500 pg/mL — ABNORMAL HIGH (ref 211–911)

## 2017-03-28 LAB — FERRITIN: Ferritin: 30 ng/mL (ref 10.0–291.0)

## 2017-03-28 MED ORDER — ESCITALOPRAM OXALATE 20 MG PO TABS: 20.0000 mg | ORAL_TABLET | Freq: Every day | ORAL | 3 refills | Status: DC

## 2017-03-28 MED ORDER — INTEGRA PLUS PO CAPS: 1.0000 | ORAL_CAPSULE | Freq: Every day | ORAL | 3 refills | Status: DC

## 2017-03-28 MED ORDER — BUPROPION HCL 100 MG PO TABS
ORAL_TABLET | ORAL | 3 refills | Status: DC
Start: 2017-03-28 — End: 2017-09-12
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
## 2017-03-28 NOTE — Patient Instructions (Addendum)
A few things to remember from today's visit:   History of bariatric surgery - Plan: VITAMIN D 25 Hydroxy (Vit-D Deficiency, Fractures), Vitamin B12  History of anemia - Plan: Ferritin, Iron, FeFum-FePoly-FA-B Cmp-C-Biot (INTEGRA PLUS) CAPS  Depressive disorder - Plan: escitalopram (LEXAPRO) 20 MG tablet, buPROPion (WELLBUTRIN) 100 MG tablet  No changes today.  Please be sure medication list is accurate. If a new problem present, please set up appointment sooner than planned today.

## 2017-07-16 ENCOUNTER — Encounter: Payer: Self-pay | Admitting: Family Medicine

## 2017-09-11 NOTE — Progress Notes (Signed)
HPI:   [name] is a 62 y.o. female, who is here today for 6 months follow up and for her routine CPE.  She was last seen on 03/28/17  She lives alone with her dog. Exercises 5 times per week. Recliner bike,walking,and wt lifting. She follows a healthy diet.    Knee OA, she walks with a cane She follows with ortho and gets intraarticular hyaluronic acid as needed.  Uses handicap sticker. No falls in the past year.  S/P bariatric surgery. She is on iron supplementation,B12,and vit D.   Depression for many years,she is on Lexapro and Wellbutrin. Tolerating medication well,no side effects reported. Negative for depressed mood or suicidal thoughts.   Hyperlipidemia:  Currently on non pharmacologic treatment. Following a low fat diet: Yes..    Lab Results  Component Value Date   CHOL 211 (H) 08/08/2016   HDL 67.70 08/08/2016   LDLCALC 129 (H) 08/08/2016   LDLDIRECT 189.9 12/22/2007   TRIG 70.0 08/08/2016   CHOLHDL 3 08/08/2016    Lab Results  Component Value Date   HGBA1C 5.7 08/08/2016   Pap smear 7.30/2018 negative for HPV. Colonoscopy 06/2015. Mammogram 05/2016. She received a letter and planning on scheduling.  DEXA many years ago,negative.   Review of Systems  Constitutional: Negative for appetite change, fatigue and fever.  HENT: Negative for hearing loss, mouth sores, trouble swallowing and voice change.   Eyes: Negative for redness and visual disturbance.  Respiratory: Negative for cough, shortness of breath and wheezing.   Cardiovascular: Negative for chest pain and leg swelling.  Gastrointestinal: Negative for abdominal pain, nausea and vomiting.       No changes in bowel habits.  Endocrine: Negative for cold intolerance, heat intolerance, polydipsia, polyphagia and polyuria.  Genitourinary: Negative for decreased urine volume, dysuria, hematuria, vaginal bleeding and vaginal discharge.  Musculoskeletal: Positive for arthralgias.  Negative for gait problem and neck pain.  Skin: Negative for color change and rash.  Neurological: Negative for syncope, weakness and headaches.  Hematological: Negative for adenopathy. Does not bruise/bleed easily.  Psychiatric/Behavioral: Negative for confusion and sleep disturbance. The patient is not nervous/anxious.   All other systems reviewed and are negative.   Current Outpatient Medications on File Prior to Visit  Medication Sig Dispense Refill  . albuterol (PROVENTIL HFA;VENTOLIN HFA) 108 (90 Base) MCG/ACT inhaler Inhale 2 puffs into the lungs every 6 (six) hours as needed. 1 Inhaler 1  . aspirin 81 MG tablet Take 1 tablet (81 mg total) by mouth daily. 90 tablet 3  . Bacillus Coagulans-Inulin (PROBIOTIC-PREBIOTIC) 1-250 BILLION-MG CAPS Take 1 capsule by mouth daily.    . BucAlfAspKGlucCouchParsUvaUrJu (WATER PILLS PO) Take by mouth. Vegetarian formula    . cetirizine (ZYRTEC) 10 MG tablet Take 10 mg by mouth daily.    . Cyanocobalamin 1000 MCG SUBL Place 1 tablet (1,000 mcg total) under the tongue daily. 90 tablet 3  . Multiple Vitamins-Minerals ([facility] DAILY MULTIPLE PLUS MIN) TABS TAKE 1 TABLET BY MOUTH DAILY. 90 tablet 3  . Omega-3 Fatty Acids (OMEGA 3 PO) Take 1,200 mg by mouth daily. 684 mg, EPA 410, DHA 274    . PRESCRIPTION MEDICATION Vision support    . vitamin C (ASCORBIC ACID) 500 MG tablet Take 500 mg by mouth daily.     No current facility-administered medications on file prior to visit.      Past Medical History:  Diagnosis Date  . Asthma   . Depression   .  Diabetes mellitus    no meds, AIC's normal since gastric bypass  . Hyperlipidemia   . OA (osteoarthritis)   . Obesity   . Venous insufficiency    No Known Allergies  Social History   Socioeconomic History  . Marital status: Divorced    Spouse name: Not on file  . Number of children: 2  . Years of education: Not on file  . Highest education level: Not on file  Occupational History  . Occupation:  Counsellorcustomer relationship manager    Comment: [employer]  Social Needs  . Financial resource strain: Not on file  . Food insecurity:    Worry: Not on file    Inability: Not on file  . Transportation needs:    Medical: Not on file    Non-medical: Not on file  Tobacco Use  . Smoking status: Former Smoker    Types: Cigarettes    Last attempt to quit: 03/26/1975    Years since quitting: 42.5  . Smokeless tobacco: Never Used  Substance and Sexual Activity  . Alcohol use: Yes    Alcohol/week: 0.6 oz    Types: 1 Cans of beer per week    Comment: social  . Drug use: No  . Sexual activity: Never  Lifestyle  . Physical activity:    Days per week: Not on file    Minutes per session: Not on file  . Stress: Not on file  Relationships  . Social connections:    Talks on phone: Not on file    Gets together: Not on file    Attends religious service: Not on file    Active member of club or organization: Not on file    Attends meetings of clubs or organizations: Not on file    Relationship status: Not on file  Other Topics Concern  . Not on file  Social History Narrative  . Not on file    Vitals:   09/12/17 0936  BP: 122/80  Pulse: 95  Resp: 12  Temp: 98.6 F (37 C)  SpO2: 96%   Body mass index is 44.65 kg/m.    Physical Exam  Nursing note and vitals reviewed. Constitutional: She is oriented to person, place, and time. She appears well-developed. No distress.  HENT:  Head: Normocephalic and atraumatic.  Right Ear: Hearing, tympanic membrane, external ear and ear canal normal.  Left Ear: Hearing, tympanic membrane, external ear and ear canal normal.  Mouth/Throat: Uvula is midline, oropharynx is clear and moist and mucous membranes are normal.  Eyes: Pupils are equal, round, and reactive to light. Conjunctivae and EOM are normal.  Neck: No tracheal deviation present. No thyromegaly present.  Cardiovascular: Normal rate and regular rhythm.  No murmur heard. Pulses:       Dorsalis pedis pulses are 2+ on the right side, and 2+ on the left side.  Respiratory: Effort normal and breath sounds normal. No respiratory distress.  GI: Soft. She exhibits no mass. There is no hepatomegaly. There is no tenderness.  Genitourinary:  Genitourinary Comments: Breast:  No masses,skin abnormalities,or nipple discharge bilateral.   Musculoskeletal: She exhibits no edema.  No major deformity or signs of synovitis appreciated.  Lymphadenopathy:    She has no cervical adenopathy.       Right: No supraclavicular adenopathy present.       Left: No supraclavicular adenopathy present.  Neurological: She is alert and oriented to person, place, and time. She has normal strength. No cranial nerve deficit. Coordination and  gait normal.  Reflex Scores:      Bicep reflexes are 2+ on the right side and 2+ on the left side.      Patellar reflexes are 2+ on the right side and 2+ on the left side. Skin: Skin is warm. No rash noted. No erythema.  Psychiatric: She has a normal mood and affect. Her speech is normal.  Well groomed, good eye contact.       ASSESSMENT AND PLAN:   Ms. Jacqueline Hawkins was seen today for 6 months follow-up and CPE.  Orders Placed This Encounter  Procedures  . DEXAScan  . Comprehensive metabolic panel  . Hemoglobin A1c  . Lipid panel     Routine general medical examination at a health care facility  We discussed the importance of regular physical activity and healthy diet for prevention of chronic illness and/or complications. Preventive guidelines reviewed. Vaccination up dated, Shingrix recommended,can get it at the pharmacy. She dill arrange [facility] at [facility]. Ca++ and vit D supplementation to continue. Next CPE in a year.  Asymptomatic postmenopausal estrogen deficiency - DEXAScan; Future   Iron deficiency anemia, unspecified iron deficiency anemia type  Last cbc otherwise normal. No changes in current management.  - FeFum-FePoly-FA-B  Cmp-C-Biot (INTEGRA PLUS) CAPS; Take 1 capsule by mouth daily.  Dispense: 90 capsule; Refill: 3   Hyperlipidemia Continue nonpharmacologic treatment, will follow labs next week and will give further recommendations accordingly. Follow-up in 6 to 12 months depending on lipid panel results.  Major depressive disorder with single episode, in remission (HCC) Problem still well controlled with current medication and tolerating them well.  I think it is appropriate at this time to follow annually, before if needed     Symiah Nowotny G. [place], MD  [facility]. [place] office.

## 2017-09-12 ENCOUNTER — Ambulatory Visit (INDEPENDENT_AMBULATORY_CARE_PROVIDER_SITE_OTHER): Payer: 59 | Admitting: Family Medicine

## 2017-09-12 ENCOUNTER — Encounter: Payer: Self-pay | Admitting: Family Medicine

## 2017-09-12 VITALS — BP 122/80 | HR 95 | Temp 98.6°F | Resp 12 | Ht 62.0 in | Wt 244.1 lb

## 2017-09-12 DIAGNOSIS — F329 Major depressive disorder, single episode, unspecified: Secondary | ICD-10-CM

## 2017-09-12 DIAGNOSIS — Z0000 Encounter for general adult medical examination without abnormal findings: Secondary | ICD-10-CM

## 2017-09-12 DIAGNOSIS — Z780 Asymptomatic menopausal state: Secondary | ICD-10-CM | POA: Diagnosis not present

## 2017-09-12 DIAGNOSIS — E785 Hyperlipidemia, unspecified: Secondary | ICD-10-CM

## 2017-09-12 DIAGNOSIS — F3341 Major depressive disorder, recurrent, in partial remission: Secondary | ICD-10-CM | POA: Insufficient documentation

## 2017-09-12 DIAGNOSIS — F325 Major depressive disorder, single episode, in full remission: Secondary | ICD-10-CM | POA: Diagnosis not present

## 2017-09-12 DIAGNOSIS — D509 Iron deficiency anemia, unspecified: Secondary | ICD-10-CM | POA: Diagnosis not present

## 2017-09-12 DIAGNOSIS — F32A Depression, unspecified: Secondary | ICD-10-CM

## 2017-09-12 MED ORDER — INTEGRA PLUS PO CAPS: 1.0000 | ORAL_CAPSULE | Freq: Every day | ORAL | 3 refills | Status: DC

## 2017-09-12 MED ORDER — ESCITALOPRAM OXALATE 20 MG PO TABS: 20.0000 mg | ORAL_TABLET | Freq: Every day | ORAL | 3 refills | Status: DC

## 2017-09-12 MED ORDER — BUPROPION HCL 100 MG PO TABS: ORAL_TABLET | ORAL | 3 refills | Status: DC

## 2017-09-12 NOTE — Patient Instructions (Addendum)
A few things to remember from today's visit:   Routine general medical examination at a health care facility  Hyperlipidemia, unspecified hyperlipidemia type - Plan: Comprehensive metabolic panel, Lipid panel, CANCELED: Basic metabolic panel, CANCELED: Hemoglobin A1c, CANCELED: Lipid panel  Depressive disorder  Asymptomatic postmenopausal estrogen deficiency - Plan: DEXAScan  Today you have you routine preventive visit.  At least 150 minutes of moderate exercise per week, daily brisk walking for 15-30 min is a good exercise option. Healthy diet low in saturated (animal) fats and sweets and consisting of fresh fruits and vegetables, lean meats such as fish and white chicken and whole grains.  These are some of recommendations for screening depending of age and risk factors:   - Vaccines:  Tdap vaccine every 10 years.  Shingles vaccine recommended at age 62, could be given after 10550 years of age but not sure about insurance coverage.   Pneumonia vaccines:  Prevnar 13 at 65 and Pneumovax at 66. Sometimes Pneumovax is giving earlier if history of smoking, lung disease,diabetes,kidney disease among some.     Cervical cancer prevention:  Pap smear starts at 62 years of age and continues periodically until 62 years old in low risk women. Pap smear every 3 years between 5921 and 349 years old. Pap smear every 3-5 years between women 30 and older if pap smear negative and HPV screening negative.   -Breast cancer: Mammogram: There is disagreement between experts about when to start screening in low risk asymptomatic female but recent recommendations are to start screening at 7940 and not later than 62 years old , every 1-2 years and after 62 yo q 2 years. Screening is recommended until 62 years old but some women can continue screening depending of healthy issues.   Colon cancer screening: starts at 5  Also recommended:  1. Dental visit- Brush and floss your teeth twice daily; visit your  dentist twice a year. 2. Eye doctor- Get an eye exam at least every 2 years. 3. Helmet use- Always wear a helmet when riding a bicycle, motorcycle, rollerblading or skateboarding. 4. Safe sex- If you may be exposed to sexually transmitted infections, use a condom. 5. Seat belts- Seat belts can save your live; always wear one. 6. Smoke/Carbon Monoxide detectors- These detectors need to be installed on the appropriate level of your home. Replace batteries at least once a year. 7. Skin cancer- When out in the sun please cover up and use sunscreen 15 SPF or higher. 8. Violence- If anyone is threatening or hurting you, please tell your healthcare provider.  9. Drink alcohol in moderation- Limit alcohol intake to one drink or less per day. Never drink and drive.   Please be sure medication list is accurate. If a new problem present, please set up appointment sooner than planned today.

## 2017-09-12 NOTE — Assessment & Plan Note (Addendum)
Continue nonpharmacologic treatment, will follow labs next week and will give further recommendations accordingly. Follow-up in 6 to 12 months depending on lipid panel results.

## 2017-09-12 NOTE — Assessment & Plan Note (Addendum)
Problem still well controlled with current medication and tolerating them well.  I think it is appropriate at this time to follow annually, before if needed

## 2017-09-12 NOTE — Assessment & Plan Note (Deleted)
Probably still well controlled with current medication and tolerating them well.  I think it is appropriate at this time to follow annually, before if needed

## 2017-09-15 ENCOUNTER — Other Ambulatory Visit (INDEPENDENT_AMBULATORY_CARE_PROVIDER_SITE_OTHER): Payer: 59

## 2017-09-15 DIAGNOSIS — Z0000 Encounter for general adult medical examination without abnormal findings: Secondary | ICD-10-CM

## 2017-09-15 DIAGNOSIS — E785 Hyperlipidemia, unspecified: Secondary | ICD-10-CM | POA: Diagnosis not present

## 2017-09-15 LAB — COMPREHENSIVE METABOLIC PANEL
ALBUMIN: 4 g/dL (ref 3.5–5.2)
ALT: 16 U/L (ref 0–35)
AST: 19 U/L (ref 0–37)
Alkaline Phosphatase: 68 U/L (ref 39–117)
BUN: 13 mg/dL (ref 6–23)
CALCIUM: 9.3 mg/dL (ref 8.4–10.5)
CHLORIDE: 103 meq/L (ref 96–112)
CO2: 31 mEq/L (ref 19–32)
Creatinine, Ser: 0.72 mg/dL (ref 0.40–1.20)
GFR: 87.29 mL/min (ref >60.00)
Glucose, Bld: 99 mg/dL (ref 70–99)
POTASSIUM: 4.5 meq/L (ref 3.5–5.1)
Sodium: 141 mEq/L (ref 135–145)
Total Bilirubin: 0.4 mg/dL (ref 0.2–1.2)
Total Protein: 6.5 g/dL (ref 6.0–8.3)

## 2017-09-15 LAB — LIPID PANEL
CHOLESTEROL: 224 mg/dL — AB (ref 0–200)
HDL: 68.3 mg/dL (ref >39.00)
LDL CALC: 141 mg/dL — AB (ref 0–99)
NonHDL: 155.78
Total CHOL/HDL Ratio: 3
Triglycerides: 75 mg/dL (ref 0.0–149.0)
VLDL: 15 mg/dL (ref 0.0–40.0)

## 2017-09-15 LAB — HEMOGLOBIN A1C: Hgb A1c MFr Bld: 6 % (ref 4.6–6.5)

## 2017-09-18 ENCOUNTER — Encounter: Payer: Self-pay | Admitting: Family Medicine

## 2017-09-23 ENCOUNTER — Encounter: Payer: Self-pay | Admitting: Family Medicine

## 2017-09-23 ENCOUNTER — Other Ambulatory Visit: Payer: Self-pay | Admitting: *Deleted

## 2017-09-23 MED ORDER — ATORVASTATIN CALCIUM 10 MG PO TABS: 10.0000 mg | ORAL_TABLET | Freq: Every day | ORAL | 3 refills | Status: DC

## 2017-10-24 LAB — HM MAMMOGRAPHY

## 2017-10-24 LAB — HM DEXA SCAN

## 2017-11-01 ENCOUNTER — Encounter: Payer: Self-pay | Admitting: Family Medicine

## 2017-11-03 ENCOUNTER — Encounter: Payer: Self-pay | Admitting: Family Medicine

## 2017-11-10 ENCOUNTER — Encounter: Payer: Self-pay | Admitting: Family Medicine

## 2017-11-10 ENCOUNTER — Telehealth: Payer: Self-pay | Admitting: *Deleted

## 2017-11-10 NOTE — Telephone Encounter (Signed)
Left message for patient to give clinic a call back for bone density results.

## 2018-01-15 ENCOUNTER — Encounter: Payer: Self-pay | Admitting: Family Medicine

## 2018-03-13 ENCOUNTER — Encounter (HOSPITAL_COMMUNITY): Payer: Self-pay

## 2018-07-17 ENCOUNTER — Other Ambulatory Visit: Payer: Self-pay | Admitting: Family Medicine

## 2018-09-22 ENCOUNTER — Other Ambulatory Visit: Payer: Self-pay | Admitting: Family Medicine

## 2018-09-22 ENCOUNTER — Encounter: Payer: Self-pay | Admitting: Family Medicine

## 2018-09-22 DIAGNOSIS — F325 Major depressive disorder, single episode, in full remission: Secondary | ICD-10-CM

## 2018-09-23 ENCOUNTER — Other Ambulatory Visit: Payer: Self-pay

## 2018-09-23 ENCOUNTER — Ambulatory Visit (INDEPENDENT_AMBULATORY_CARE_PROVIDER_SITE_OTHER): Payer: 59 | Admitting: Family Medicine

## 2018-09-23 DIAGNOSIS — R05 Cough: Secondary | ICD-10-CM

## 2018-09-23 DIAGNOSIS — E119 Type 2 diabetes mellitus without complications: Secondary | ICD-10-CM | POA: Insufficient documentation

## 2018-09-23 DIAGNOSIS — E785 Hyperlipidemia, unspecified: Secondary | ICD-10-CM | POA: Diagnosis not present

## 2018-09-23 DIAGNOSIS — E11A Type 2 diabetes mellitus without complications in remission: Secondary | ICD-10-CM | POA: Insufficient documentation

## 2018-09-23 DIAGNOSIS — F325 Major depressive disorder, single episode, in full remission: Secondary | ICD-10-CM | POA: Diagnosis not present

## 2018-09-23 DIAGNOSIS — R059 Cough, unspecified: Secondary | ICD-10-CM

## 2018-09-23 DIAGNOSIS — J4520 Mild intermittent asthma, uncomplicated: Secondary | ICD-10-CM

## 2018-09-23 MED ORDER — ATORVASTATIN CALCIUM 10 MG PO TABS: 10.0000 mg | ORAL_TABLET | Freq: Every day | ORAL | 3 refills | Status: DC

## 2018-09-23 MED ORDER — ESCITALOPRAM OXALATE 20 MG PO TABS: 20.0000 mg | ORAL_TABLET | Freq: Every day | ORAL | 3 refills | Status: DC

## 2018-09-23 MED ORDER — BUPROPION HCL 100 MG PO TABS: ORAL_TABLET | ORAL | 3 refills | Status: DC

## 2018-09-23 NOTE — Progress Notes (Signed)
Virtual Visit via Video Note   I connected with Jacqueline Hawkins on 09/25/18 at  7:30 AM EDT by a video enabled telemedicine application and verified that I am speaking with the correct person using two identifiers.  Location patient: home Location provider:home office Persons participating in the virtual visit: patient, provider  I discussed the limitations of evaluation and management by telemedicine and the availability of in person appointments. The patient expressed understanding and agreed to proceed.   HPI: Jacqueline Hawkins is a 63 yo female with Hx of depression,HLD,and OA being seen today for chronic disease management.  Last visit on 09/13/18. She does not feel comfortable to come to the office for labs. She has gained some wt since she has been working from home. Not exercising regularly,sitting most of the day.  DM II, numbers back to normal after bariatric surgery. She is not checking BS's.  Lab Results  Component Value Date   HGBA1C 6.0 09/15/2017   She takes OTC Vit D 800 U and B12 1000 mcg daily.  HLD: She is on Lipitor 10 mg daily. Tolerating medication well,no side effects reported. She has had problem for many years.  Denies severe/frequent headache, visual changes, chest pain, dyspnea, palpitation, claudication, focal weakness, or edema.  Lab Results  Component Value Date   CHOL 224 (H) 09/15/2017   HDL 68.30 09/15/2017   LDLCALC 141 (H) 09/15/2017   LDLDIRECT 189.9 12/22/2007   TRIG 75.0 09/15/2017   CHOLHDL 3 09/15/2017   Depression: Taking Wellbutrin 100 mg tid and Lexapro 20 mg daily. Tolerating medications well. Denies depressed mood or suicidal thoughts.  Asthma, she uses Albuterol inh as needed,not frequent. + Former smoker. Negative for cough,wheezing,SOB.  She has not been exercising regularly nor consistent with a healthful diet. She has gained some wt.   ROS: See pertinent positives and negatives per HPI.  Past Medical History:  Diagnosis  Date  . Asthma   . Depression   . Diabetes mellitus    no meds, AIC's normal since gastric bypass  . Hyperlipidemia   . OA (osteoarthritis)   . Obesity   . Venous insufficiency     Past Surgical History:  Procedure Laterality Date  . GASTRIC BYPASS    . TOTAL HIP ARTHROPLASTY Left     Family History  Problem Relation Age of Onset  . Diabetes Father   . Heart disease Father   . Thyroid disease Mother   . Breast cancer Maternal Aunt     Social History   Socioeconomic History  . Marital status: Divorced    Spouse name: Not on file  . Number of children: 2  . Years of education: Not on file  . Highest education level: Not on file  Occupational History  . Occupation: Counsellorcustomer relationship manager    Comment: [employer]  Social Needs  . Financial resource strain: Not on file  . Food insecurity    Worry: Not on file    Inability: Not on file  . Transportation needs    Medical: Not on file    Non-medical: Not on file  Tobacco Use  . Smoking status: Former Smoker    Types: Cigarettes    Quit date: 03/26/1975    Years since quitting: 43.5  . Smokeless tobacco: Never Used  Substance and Sexual Activity  . Alcohol use: Yes    Alcohol/week: 1.0 standard drinks    Types: 1 Cans of beer per week    Comment: social  . Drug  use: No  . Sexual activity: Never  Lifestyle  . Physical activity    Days per week: Not on file    Minutes per session: Not on file  . Stress: Not on file  Relationships  . Social Herbalist on phone: Not on file    Gets together: Not on file    Attends religious service: Not on file    Active member of club or organization: Not on file    Attends meetings of clubs or organizations: Not on file    Relationship status: Not on file  . Intimate partner violence    Fear of current or ex partner: Not on file    Emotionally abused: Not on file    Physically abused: Not on file    Forced sexual activity: Not on file  Other Topics  Concern  . Not on file  Social History Narrative  . Not on file      Current Outpatient Medications:  .  albuterol (VENTOLIN HFA) 108 (90 Base) MCG/ACT inhaler, Inhale 2 puffs into the lungs every 6 (six) hours as needed., Disp: 18 g, Rfl: 0 .  aspirin 81 MG tablet, Take 1 tablet (81 mg total) by mouth daily., Disp: 90 tablet, Rfl: 3 .  atorvastatin (LIPITOR) 10 MG tablet, Take 1 tablet (10 mg total) by mouth daily., Disp: 90 tablet, Rfl: 3 .  Bacillus Coagulans-Inulin (PROBIOTIC-PREBIOTIC) 1-250 BILLION-MG CAPS, Take 1 capsule by mouth daily., Disp: , Rfl:  .  BucAlfAspKGlucCouchParsUvaUrJu (WATER PILLS PO), Take by mouth. Vegetarian formula, Disp: , Rfl:  .  buPROPion (WELLBUTRIN) 100 MG tablet, TAKE 3 TABLETs (100 MG TOTAL)  DAILY., Disp: 270 tablet, Rfl: 3 .  cetirizine (ZYRTEC) 10 MG tablet, Take 10 mg by mouth daily., Disp: , Rfl:  .  Cyanocobalamin 1000 MCG SUBL, Place 1 tablet (1,000 mcg total) under the tongue daily., Disp: 90 tablet, Rfl: 3 .  escitalopram (LEXAPRO) 20 MG tablet, Take 1 tablet (20 mg total) by mouth daily., Disp: 90 tablet, Rfl: 3 .  FeFum-FePoly-FA-B Cmp-C-Biot (INTEGRA PLUS) CAPS, Take 1 capsule by mouth daily., Disp: 90 capsule, Rfl: 3 .  Multiple Vitamins-Minerals ([facility]S DAILY MULTIPLE PLUS MIN) TABS, TAKE 1 TABLET BY MOUTH DAILY., Disp: 90 tablet, Rfl: 3 .  Omega-3 Fatty Acids (OMEGA 3 PO), Take 1,200 mg by mouth daily. 684 mg, EPA 410, DHA 274, Disp: , Rfl:  .  PRESCRIPTION MEDICATION, Vision support, Disp: , Rfl:  .  vitamin C (ASCORBIC ACID) 500 MG tablet, Take 500 mg by mouth daily., Disp: , Rfl:   EXAM:  VITALS per patient if applicable:N/A  GENERAL: alert, oriented, appears well and in no acute distress  HEENT: atraumatic, conjunctiva clear, no obvious facial abnormalities on inspection.  NECK: normal movements of the head and neck  LUNGS: on inspection no signs of respiratory distress, breathing rate appears normal, no obvious gross SOB, gasping  or wheezing  CV: no obvious cyanosis  Jacqueline: moves all visible extremities without noticeable abnormality  PSYCH/NEURO: pleasant and cooperative, no obvious depression or anxiety, speech and thought processing grossly intact  ASSESSMENT AND PLAN:  Discussed the following assessment and plan:  Hyperlipidemia, unspecified hyperlipidemia type - Plan: Continue Atorvastatin 10 mg daily. Low fat diet also recommended. She does not feel comfortable coming for labs,so will plan on checking it next OV.  Controlled type 2 diabetes mellitus without complication, without long-term current use of insulin ([place]) - Plan: S/P bariatric surgery. Continue dietary management. Will  re-check HgA1C next OV.  Major depressive disorder with single episode, in remission (HCC) - Plan: escitalopram (LEXAPRO) 20 MG tablet, buPROPion (WELLBUTRIN) 100 MG tablet. Problem is well controlled. No changes in current management.  Obesity, morbid, BMI 40.0-49.9 (HCC) - Plan: We discussed benefits of wt loss as well as adverse effects of obesity. Consistency with healthy diet and physical activity recommended.   Mild intermittent asthma without complication - Plan: Has been asymptomatic. Continue Albuterol inh 2 pff qid prn. Sent new Rx.    I discussed the assessment and treatment plan with the patient. She was provided an opportunity to ask questions and all were answered. The patient agreed with the plan and demonstrated an understanding of the instructions.     Return in about 1 year (around 09/23/2019), or Before if she feels saver coming to the office., for she will call to schedule CPE when she feels more comfortable doing so.Marland Kitchen.     [place], MD

## 2018-09-25 ENCOUNTER — Encounter: Payer: Self-pay | Admitting: Family Medicine

## 2018-09-25 MED ORDER — ALBUTEROL SULFATE HFA 108 (90 BASE) MCG/ACT IN AERS: 2.0000 | INHALATION_SPRAY | Freq: Four times a day (QID) | RESPIRATORY_TRACT | 0 refills | Status: DC | PRN

## 2018-10-28 ENCOUNTER — Other Ambulatory Visit: Payer: Self-pay | Admitting: Family Medicine

## 2018-10-28 DIAGNOSIS — D509 Iron deficiency anemia, unspecified: Secondary | ICD-10-CM

## 2018-12-23 ENCOUNTER — Telehealth (INDEPENDENT_AMBULATORY_CARE_PROVIDER_SITE_OTHER): Payer: 59 | Admitting: Family Medicine

## 2018-12-23 ENCOUNTER — Other Ambulatory Visit: Payer: Self-pay

## 2018-12-23 ENCOUNTER — Encounter: Payer: Self-pay | Admitting: Family Medicine

## 2018-12-23 DIAGNOSIS — L237 Allergic contact dermatitis due to plants, except food: Secondary | ICD-10-CM | POA: Diagnosis not present

## 2018-12-23 MED ORDER — PREDNISONE 10 MG PO TABS: ORAL_TABLET | ORAL | 0 refills | Status: DC

## 2018-12-23 NOTE — Progress Notes (Signed)
This visit type was conducted due to national recommendations for restrictions regarding the COVID-19 pandemic in an effort to limit this patient's exposure and mitigate transmission in our community.   Virtual Visit via Video Note  I connected with Jacqueline Hawkins on 12/23/18 at 10:30 AM EDT by a video enabled telemedicine application and verified that I am speaking with the correct person using two identifiers.  Location patient: home Location provider:work or home office Persons participating in the virtual visit: patient, provider  I discussed the limitations of evaluation and management by telemedicine and the availability of in person appointments. The patient expressed understanding and agreed to proceed.   HPI: Patient has pruritic rash involving her right hand, right arm, and neck.  She spent a lot of time last weekend out working in some plantbeds and pulling up several roots.  She has had previous problems with contact dermatitis with poison ivy in the past.  She tried cortisone cream topically without much improvement.  Her symptoms are fairly severe in terms of pruritus.  She does have past history of type 2 diabetes but that reversed with her gastric bypass.  She has tolerated prednisone in the past.   ROS: See pertinent positives and negatives per HPI.  Past Medical History:  Diagnosis Date  . Asthma   . Depression   . Diabetes mellitus    no meds, AIC's normal since gastric bypass  . Hyperlipidemia   . OA (osteoarthritis)   . Obesity   . Venous insufficiency     Past Surgical History:  Procedure Laterality Date  . GASTRIC BYPASS    . TOTAL HIP ARTHROPLASTY Left     Family History  Problem Relation Age of Onset  . Diabetes Father   . Heart disease Father   . Thyroid disease Mother   . Breast cancer Maternal Aunt     SOCIAL HX: Quit smoking 1977   Current Outpatient Medications:  .  albuterol (VENTOLIN HFA) 108 (90 Base) MCG/ACT inhaler, Inhale 2 puffs into  the lungs every 6 (six) hours as needed., Disp: 18 g, Rfl: 0 .  aspirin 81 MG tablet, Take 1 tablet (81 mg total) by mouth daily., Disp: 90 tablet, Rfl: 3 .  atorvastatin (LIPITOR) 10 MG tablet, Take 1 tablet (10 mg total) by mouth daily., Disp: 90 tablet, Rfl: 3 .  Bacillus Coagulans-Inulin (PROBIOTIC-PREBIOTIC) 1-250 BILLION-MG CAPS, Take 1 capsule by mouth daily., Disp: , Rfl:  .  BucAlfAspKGlucCouchParsUvaUrJu (WATER PILLS PO), Take by mouth. Vegetarian formula, Disp: , Rfl:  .  buPROPion (WELLBUTRIN) 100 MG tablet, TAKE 3 TABLETs (100 MG TOTAL)  DAILY., Disp: 270 tablet, Rfl: 3 .  cetirizine (ZYRTEC) 10 MG tablet, Take 10 mg by mouth daily., Disp: , Rfl:  .  Cyanocobalamin 1000 MCG SUBL, Place 1 tablet (1,000 mcg total) under the tongue daily., Disp: 90 tablet, Rfl: 3 .  escitalopram (LEXAPRO) 20 MG tablet, Take 1 tablet (20 mg total) by mouth daily., Disp: 90 tablet, Rfl: 3 .  FeFum-FePoly-FA-B Cmp-C-Biot (FOLIVANE-PLUS) CAPS, TAKE 1 CAPSULE BY MOUTH EVERY DAY, Disp: 90 capsule, Rfl: 3 .  Multiple Vitamins-Minerals ([facility] DAILY MULTIPLE PLUS MIN) TABS, TAKE 1 TABLET BY MOUTH DAILY., Disp: 90 tablet, Rfl: 3 .  Omega-3 Fatty Acids (OMEGA 3 PO), Take 1,200 mg by mouth daily. 684 mg, EPA 410, DHA 274, Disp: , Rfl:  .  predniSONE (DELTASONE) 10 MG tablet, Taper as follows: 4-4-4-4-3-3-3-2-2-1-1, Disp: 31 tablet, Rfl: 0 .  PRESCRIPTION MEDICATION, Vision support, Disp: ,  Rfl:  .  vitamin C (ASCORBIC ACID) 500 MG tablet, Take 500 mg by mouth daily., Disp: , Rfl:   EXAM:  VITALS per patient if applicable:  GENERAL: alert, oriented, appears well and in no acute distress  HEENT: atraumatic, conjunttiva clear, no obvious abnormalities on inspection of external nose and ears  NECK: normal movements of the head and neck  LUNGS: on inspection no signs of respiratory distress, breathing rate appears normal, no obvious gross SOB, gasping or wheezing  CV: no obvious cyanosis  MS: moves all visible  extremities without noticeable abnormality  PSYCH/NEURO: pleasant and cooperative, no obvious depression or anxiety, speech and thought processing grossly intact  ASSESSMENT AND PLAN:  Discussed the following assessment and plan:  Allergic contact dermatitis due to plants, except food  -Prednisone taper starting at 40 mg daily over the next 12 days -Reviewed potential side effects with prednisone -Follow-up as needed     I discussed the assessment and treatment plan with the patient. The patient was provided an opportunity to ask questions and all were answered. The patient agreed with the plan and demonstrated an understanding of the instructions.   The patient was advised to call back or seek an in-person evaluation if the symptoms worsen or if the condition fails to improve as anticipated.    Evelena Peat, MD

## 2019-06-22 ENCOUNTER — Encounter: Payer: Self-pay | Admitting: Family Medicine

## 2019-06-30 NOTE — Telephone Encounter (Signed)
Pt called to check on the status of her my-chart message she sent March 30th. Pt would like to know if it is possible for an in home sleep study?

## 2019-07-02 ENCOUNTER — Other Ambulatory Visit: Payer: Self-pay | Admitting: Family Medicine

## 2019-07-02 DIAGNOSIS — Z0189 Encounter for other specified special examinations: Secondary | ICD-10-CM

## 2019-07-02 DIAGNOSIS — R0683 Snoring: Secondary | ICD-10-CM

## 2019-08-02 ENCOUNTER — Other Ambulatory Visit: Payer: Self-pay

## 2019-08-02 ENCOUNTER — Ambulatory Visit: Payer: 59 | Admitting: Pulmonary Disease

## 2019-08-02 ENCOUNTER — Encounter: Payer: Self-pay | Admitting: Pulmonary Disease

## 2019-08-02 VITALS — BP 122/80 | HR 89 | Temp 97.8°F | Ht 62.0 in | Wt 251.4 lb

## 2019-08-02 DIAGNOSIS — G4733 Obstructive sleep apnea (adult) (pediatric): Secondary | ICD-10-CM

## 2019-08-02 NOTE — Progress Notes (Signed)
Jacqueline Hawkins    [id number]    Aug 08, 1955  Primary Care Physician:Jordan, Malka So, MD  Referring Physician: [place], Betty G, [place] [address] [place] [place],  [place] [id number]  Chief complaint:   History of sleep apnea diagnosed in 1995 Recurrence of symptoms  HPI:  History of central sleep apnea diagnosed 1995, recorded as mild She was on CPAP for few years She lost a lot of weight following bariatric surgery Was able to discontinue CPAP-she does not remember collect the specific timeline  Recently noted to have severe snoring Recorded as of snoring severely Multiple awakenings  Dryness of the mouth in the mornings No significant headaches Memory is okay Focus is good  Usually goes to bed between 11 and 12, falls asleep in 5 minutes Multiple awakenings Final wake up time 730  Weight is up about 50 pounds in the last year secondary to decreased activity with the lockdown She does have knee pain which limits activity sometimes She does have a pet dog-this does not affect her sleep  Mother did snore  Never smoker Office work  Outpatient Encounter Medications as of 08/02/2019  Medication Sig  . albuterol (VENTOLIN HFA) 108 (90 Base) MCG/ACT inhaler Inhale 2 puffs into the lungs every 6 (six) hours as needed.  Marland Kitchen aspirin 81 MG tablet Take 1 tablet (81 mg total) by mouth daily.  Marland Kitchen atorvastatin (LIPITOR) 10 MG tablet Take 1 tablet (10 mg total) by mouth daily.  . Bacillus Coagulans-Inulin (PROBIOTIC-PREBIOTIC) 1-250 BILLION-MG CAPS Take 1 capsule by mouth daily.  Marland Kitchen buPROPion (WELLBUTRIN) 100 MG tablet TAKE 3 TABLETs (100 MG TOTAL)  DAILY.  . cetirizine (ZYRTEC) 10 MG tablet Take 10 mg by mouth daily.  . Cyanocobalamin 1000 MCG SUBL Place 1 tablet (1,000 mcg total) under the tongue daily.  Marland Kitchen escitalopram (LEXAPRO) 20 MG tablet Take 1 tablet (20 mg total) by mouth daily.  Marland Kitchen FeFum-FePoly-FA-B Cmp-C-Biot (FOLIVANE-PLUS) CAPS TAKE 1 CAPSULE BY MOUTH EVERY DAY  .  Multiple Vitamins-Minerals ([facility]S DAILY MULTIPLE PLUS MIN) TABS TAKE 1 TABLET BY MOUTH DAILY.  Marland Kitchen Omega-3 Fatty Acids (OMEGA 3 PO) Take 1,200 mg by mouth daily. 684 mg, EPA 410, DHA 274  . PRESCRIPTION MEDICATION Vision support  . vitamin C (ASCORBIC ACID) 500 MG tablet Take 500 mg by mouth daily.  . [DISCONTINUED] BucAlfAspKGlucCouchParsUvaUrJu (WATER PILLS PO) Take by mouth. Vegetarian formula  . [DISCONTINUED] predniSONE (DELTASONE) 10 MG tablet Taper as follows: 4-4-4-4-3-3-3-2-2-1-1   No facility-administered encounter medications on file as of 08/02/2019.    Allergies as of 08/02/2019  . (No Known Allergies)    Past Medical History:  Diagnosis Date  . Asthma   . Depression   . Diabetes mellitus    no meds, AIC's normal since gastric bypass  . Hyperlipidemia   . OA (osteoarthritis)   . Obesity   . Venous insufficiency     Past Surgical History:  Procedure Laterality Date  . GASTRIC BYPASS    . TOTAL HIP ARTHROPLASTY Left     Family History  Problem Relation Age of Onset  . Diabetes Father   . Heart disease Father   . Thyroid disease Mother   . Breast cancer Maternal Aunt     Social History   Socioeconomic History  . Marital status: Divorced    Spouse name: Not on file  . Number of children: 2  . Years of education: Not on file  . Highest education level: Not on  file  Occupational History  . 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 Smoking status: Former Smoker    Types: Cigarettes    Quit date: 03/26/1975    Years since quitting: 44.3  . Smokeless tobacco: Never Used  Substance and Sexual Activity  . Alcohol use: Yes    Alcohol/week: 1.0 standard drinks    Types: 1 Cans of beer per week    Comment: social  . Drug use: No  . Sexual activity: Never  Other Topics Concern  . Not on file  Social History Narrative  . Not on file   Social Determinants of Health   Financial Resource Strain:   . Difficulty of  Paying Living Expenses:   Food Insecurity:   . Worried About Programme researcher, broadcasting/film/video in the Last Year:   . Barista in the Last Year:   Transportation Needs:   . Freight forwarder (Medical):   Marland Kitchen Lack of Transportation (Non-Medical):   Physical Activity:   . Days of Exercise per Week:   . Minutes of Exercise per Session:   Stress:   . Feeling of Stress :   Social Connections:   . Frequency of Communication with Friends and Family:   . Frequency of Social Gatherings with Friends and Family:   . Attends Religious Services:   . Active Member of Clubs or Organizations:   . Attends Banker Meetings:   Marland Kitchen Marital Status:   Intimate Partner Violence:   . Fear of Current or Ex-Partner:   . Emotionally Abused:   Marland Kitchen Physically Abused:   . Sexually Abused:     Review of Systems  Respiratory: Positive for apnea.   Psychiatric/Behavioral: Positive for sleep disturbance.  All other systems reviewed and are negative.   There were no vitals filed for this visit.   Physical Exam  Constitutional: She is oriented to person, place, and time. She appears well-developed.  Obese  HENT:  Head: Normocephalic.  Mallampati 4, crowded oropharynx  Eyes: Pupils are equal, round, and reactive to light.  Neck: No tracheal deviation present. No thyromegaly present.  Cardiovascular: Normal rate and regular rhythm.  Pulmonary/Chest: Effort normal and breath sounds normal. No respiratory distress. She has no wheezes. She has no rales. She exhibits no tenderness.  Musculoskeletal:        General: No edema. Normal range of motion.  Neurological: She is alert and oriented to person, place, and time.  Skin: Skin is warm.   Data Reviewed: Previous sleep study from 1995 revealed central sleep apnea  Assessment:  Excessive daytime sleepiness  Severe snoring  History of sleep apnea -Treated with CPAP therapy in the past -Discontinued CPAP use following significant weight loss  following bariatric surgery    Plan/Recommendations: We will schedule patient for a home sleep study  Weight loss efforts encouraged  Follow-up in 3 months following initiation of CPAP therapy   Virl Diamond MD [facility] 08/02/2019, 9:04 AM  CC: [place], Betty G, MD

## 2019-08-02 NOTE — Patient Instructions (Signed)
History of sleep apnea Recent weight gain  Recurrence of snoring, daytime sleepiness  We will schedule you for a home sleep study Option of treatment will be CPAP therapy  We will follow up in about 3 months  Call with significant concerns Sleep Apnea Sleep apnea is a condition in which breathing pauses or becomes shallow during sleep. Episodes of sleep apnea usually last 10 seconds or longer, and they may occur as many as 20 times an hour. Sleep apnea disrupts your sleep and keeps your body from getting the rest that it needs. This condition can increase your risk of certain health problems, including:  Heart attack.  Stroke.  Obesity.  Diabetes.  Heart failure.  Irregular heartbeat. What are the causes? There are three kinds of sleep apnea:  Obstructive sleep apnea. This kind is caused by a blocked or collapsed airway.  Central sleep apnea. This kind happens when the part of the brain that controls breathing does not send the correct signals to the muscles that control breathing.  Mixed sleep apnea. This is a combination of obstructive and central sleep apnea. The most common cause of this condition is a collapsed or blocked airway. An airway can collapse or become blocked if:  Your throat muscles are abnormally relaxed.  Your tongue and tonsils are larger than normal.  You are overweight.  Your airway is smaller than normal. What increases the risk? You are more likely to develop this condition if you:  Are overweight.  Smoke.  Have a smaller than normal airway.  Are elderly.  Are female.  Drink alcohol.  Take sedatives or tranquilizers.  Have a family history of sleep apnea. What are the signs or symptoms? Symptoms of this condition include:  Trouble staying asleep.  Daytime sleepiness and tiredness.  Irritability.  Loud snoring.  Morning headaches.  Trouble concentrating.  Forgetfulness.  Decreased interest in sex.  Unexplained  sleepiness.  Mood swings.  Personality changes.  Feelings of depression.  Waking up often during the night to urinate.  Dry mouth.  Sore throat. How is this diagnosed? This condition may be diagnosed with:  A medical history.  A physical exam.  A series of tests that are done while you are sleeping (sleep study). These tests are usually done in a sleep lab, but they may also be done at home. How is this treated? Treatment for this condition aims to restore normal breathing and to ease symptoms during sleep. It may involve managing health issues that can affect breathing, such as high blood pressure or obesity. Treatment may include:  Sleeping on your side.  Using a decongestant if you have nasal congestion.  Avoiding the use of depressants, including alcohol, sedatives, and narcotics.  Losing weight if you are overweight.  Making changes to your diet.  Quitting smoking.  Using a device to open your airway while you sleep, such as: ? An oral appliance. This is a custom-made mouthpiece that shifts your lower jaw forward. ? A continuous positive airway pressure (CPAP) device. This device blows air through a mask when you breathe out (exhale). ? A nasal expiratory positive airway pressure (EPAP) device. This device has valves that you put into each nostril. ? A bi-level positive airway pressure (BPAP) device. This device blows air through a mask when you breathe in (inhale) and breathe out (exhale).  Having surgery if other treatments do not work. During surgery, excess tissue is removed to create a wider airway. It is important to get treatment for  sleep apnea. Without treatment, this condition can lead to:  High blood pressure.  Coronary artery disease.  In men, an inability to achieve or maintain an erection (impotence).  Reduced thinking abilities. Follow these instructions at home: Lifestyle  Make any lifestyle changes that your health care provider  recommends.  Eat a healthy, well-balanced diet.  Take steps to lose weight if you are overweight.  Avoid using depressants, including alcohol, sedatives, and narcotics.  Do not use any products that contain nicotine or tobacco, such as cigarettes, e-cigarettes, and chewing tobacco. If you need help quitting, ask your health care provider. General instructions  Take over-the-counter and prescription medicines only as told by your health care provider.  If you were given a device to open your airway while you sleep, use it only as told by your health care provider.  If you are having surgery, make sure to tell your health care provider you have sleep apnea. You may need to bring your device with you.  Keep all follow-up visits as told by your health care provider. This is important. Contact a health care provider if:  The device that you received to open your airway during sleep is uncomfortable or does not seem to be working.  Your symptoms do not improve.  Your symptoms get worse. Get help right away if:  You develop: ? Chest pain. ? Shortness of breath. ? Discomfort in your back, arms, or stomach.  You have: ? Trouble speaking. ? Weakness on one side of your body. ? Drooping in your face. These symptoms may represent a serious problem that is an emergency. Do not wait to see if the symptoms will go away. Get medical help right away. Call your local emergency services (911 in the [place] Do not drive yourself to the hospital. Summary  Sleep apnea is a condition in which breathing pauses or becomes shallow during sleep.  The most common cause is a collapsed or blocked airway.  The goal of treatment is to restore normal breathing and to ease symptoms during sleep. This information is not intended to replace advice given to you by your health care provider. Make sure you discuss any questions you have with your health care provider. Document Revised: 08/26/2018 Document  Reviewed: 11/04/2017 Elsevier Patient Education  2020 [employer].

## 2019-08-15 ENCOUNTER — Other Ambulatory Visit: Payer: Self-pay | Admitting: Family Medicine

## 2019-08-15 DIAGNOSIS — F325 Major depressive disorder, single episode, in full remission: Secondary | ICD-10-CM

## 2019-08-16 NOTE — Telephone Encounter (Signed)
Patient need to schedule an ov for more refills. 

## 2019-09-03 ENCOUNTER — Ambulatory Visit: Payer: 59

## 2019-09-03 ENCOUNTER — Other Ambulatory Visit: Payer: Self-pay

## 2019-09-03 DIAGNOSIS — G4733 Obstructive sleep apnea (adult) (pediatric): Secondary | ICD-10-CM

## 2019-09-09 ENCOUNTER — Telehealth: Payer: Self-pay | Admitting: Pulmonary Disease

## 2019-09-09 DIAGNOSIS — G4733 Obstructive sleep apnea (adult) (pediatric): Secondary | ICD-10-CM

## 2019-09-09 NOTE — Telephone Encounter (Signed)
Call patient  Sleep study result  Date of study: 09/04/2019  Impression: Severe obstructive sleep apnea Severe oxygen desaturations  Recommendation: Recommend CPAP therapy for severe obstructive sleep apnea Auto titrating CPAP with pressure settings of 5-20 will be appropriate

## 2019-09-10 NOTE — Telephone Encounter (Signed)
Patient contacted with results of home sleep study. Patient agrees to start CPAP therapy. Follow up recall placed for mid August. Patient verbalized understanding of results and ongoing plan of care.

## 2019-09-13 ENCOUNTER — Encounter: Payer: Self-pay | Admitting: Family Medicine

## 2019-09-21 ENCOUNTER — Telehealth (INDEPENDENT_AMBULATORY_CARE_PROVIDER_SITE_OTHER): Payer: 59 | Admitting: Family Medicine

## 2019-09-21 ENCOUNTER — Encounter: Payer: Self-pay | Admitting: Family Medicine

## 2019-09-21 VITALS — Ht 62.0 in

## 2019-09-21 DIAGNOSIS — D509 Iron deficiency anemia, unspecified: Secondary | ICD-10-CM | POA: Diagnosis not present

## 2019-09-21 DIAGNOSIS — F325 Major depressive disorder, single episode, in full remission: Secondary | ICD-10-CM | POA: Diagnosis not present

## 2019-09-21 DIAGNOSIS — E1169 Type 2 diabetes mellitus with other specified complication: Secondary | ICD-10-CM | POA: Diagnosis not present

## 2019-09-21 DIAGNOSIS — E119 Type 2 diabetes mellitus without complications: Secondary | ICD-10-CM

## 2019-09-21 DIAGNOSIS — E785 Hyperlipidemia, unspecified: Secondary | ICD-10-CM

## 2019-09-21 MED ORDER — ESCITALOPRAM OXALATE 20 MG PO TABS: 20.0000 mg | ORAL_TABLET | Freq: Every day | ORAL | 3 refills | Status: DC

## 2019-09-21 NOTE — Progress Notes (Signed)
Virtual Visit via Video Note   I connected with Jacqueline Hawkins on 09/21/19 by a video enabled telemedicine application and verified that I am speaking with the correct person using two identifiers.  Location patient: home Location provider:work office Persons participating in the virtual visit: patient, provider  I discussed the limitations of evaluation and management by telemedicine and the availability of in person appointments. The patient expressed understanding and agreed to proceed.   HPI: Jacqueline Pilgrim is a 64 yo female following on some chronic medical problems. She was last seen on 09/23/18.  No new problems since her last visit. She has been working from home for the past 1-1/2-year, which she likes it. She has not been exercising regularly but she is following a healthful diet and has noted weight loss.  Depression: Currently she is on Wellbutrin 100 mg 3 tablets daily and Lexapro 20 mg daily. She still feels like medication is helping. No side effects reported. She denies depressed mood or suicidal thoughts.  Iron deficiency anemia: Currently she is on Folivane plus cap once daily. Negative for dyspnea, CP, palpitation, or pica. She has not noted blood in the stool, melena, or hematuria.  + Fatigue, recently diagnosed with OSA, pending CPAP arrangements.  Lab Results  Component Value Date   WBC 3.9 (L) 08/08/2016   HGB 14.1 08/08/2016   HCT 42.2 08/08/2016   MCV 97.9 08/08/2016   PLT 236.0 08/08/2016   DM 2: She has not been on medication since bariatric procedure. She is not checking BS. Problem has been well controlled on nonpharmacologic treatment.  Lab Results  Component Value Date   HGBA1C 6.0 09/15/2017   Lab Results  Component Value Date   MICROALBUR <0.7 06/09/2015   MICROALBUR 0.9 12/13/2008    Lab Results  Component Value Date   CREATININE 0.72 09/15/2017   BUN 13 09/15/2017   NA 141 09/15/2017   K 4.5 09/15/2017   CL 103 09/15/2017   CO2 31  09/15/2017   Hyperlipidemia: Currently she is on atorvastatin 10 mg daily. Lab Results  Component Value Date   CHOL 224 (H) 09/15/2017   HDL 68.30 09/15/2017   LDLCALC 141 (H) 09/15/2017   LDLDIRECT 189.9 12/22/2007   TRIG 75.0 09/15/2017   CHOLHDL 3 09/15/2017    ROS: See pertinent positives and negatives per HPI.  Past Medical History:  Diagnosis Date  . Asthma   . Depression   . Diabetes mellitus    no meds, AIC's normal since gastric bypass  . Hyperlipidemia   . OA (osteoarthritis)   . Obesity   . Venous insufficiency     Past Surgical History:  Procedure Laterality Date  . GASTRIC BYPASS    . TOTAL HIP ARTHROPLASTY Left    Family History  Problem Relation Age of Onset  . Diabetes Father   . Heart disease Father   . Thyroid disease Mother   . Breast cancer Maternal Aunt     Social History   Socioeconomic History  . Marital status: Divorced    Spouse name: Not on file  . Number of children: 2  . Years of education: Not on file  . Highest education level: Not on file  Occupational History  . 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 Smoking status: Former Smoker    Types: Cigarettes    Quit date: 03/26/1975    Years since quitting: 44.5  . Smokeless tobacco: Never Used  Substance  and Sexual Activity  . Alcohol use: Yes    Alcohol/week: 1.0 standard drink    Types: 1 Cans of beer per week    Comment: social  . Drug use: No  . Sexual activity: Never  Other Topics Concern  . Not on file  Social History Narrative  . Not on file   Social Determinants of Health   Financial Resource Strain:   . Difficulty of Paying Living Expenses:   Food Insecurity:   . Worried About Programme researcher, broadcasting/film/video in the Last Year:   . Barista in the Last Year:   Transportation Needs:   . Freight forwarder (Medical):   Marland Kitchen Lack of Transportation (Non-Medical):   Physical Activity:   . Days of Exercise per Week:   .  Minutes of Exercise per Session:   Stress:   . Feeling of Stress :   Social Connections:   . Frequency of Communication with Friends and Family:   . Frequency of Social Gatherings with Friends and Family:   . Attends Religious Services:   . Active Member of Clubs or Organizations:   . Attends Banker Meetings:   Marland Kitchen Marital Status:   Intimate Partner Violence:   . Fear of Current or Ex-Partner:   . Emotionally Abused:   Marland Kitchen Physically Abused:   . Sexually Abused:     Current Outpatient Medications:  .  albuterol (VENTOLIN HFA) 108 (90 Base) MCG/ACT inhaler, Inhale 2 puffs into the lungs every 6 (six) hours as needed., Disp: 18 g, Rfl: 0 .  aspirin 81 MG tablet, Take 1 tablet (81 mg total) by mouth daily., Disp: 90 tablet, Rfl: 3 .  atorvastatin (LIPITOR) 10 MG tablet, Take 1 tablet (10 mg total) by mouth daily., Disp: 90 tablet, Rfl: 3 .  Bacillus Coagulans-Inulin (PROBIOTIC-PREBIOTIC) 1-250 BILLION-MG CAPS, Take 1 capsule by mouth daily., Disp: , Rfl:  .  buPROPion (WELLBUTRIN) 100 MG tablet, TAKE 3 TABLETS (100 MG TOTAL) DAILY., Disp: 270 tablet, Rfl: 2 .  cetirizine (ZYRTEC) 10 MG tablet, Take 10 mg by mouth daily., Disp: , Rfl:  .  Cyanocobalamin 1000 MCG SUBL, Place 1 tablet (1,000 mcg total) under the tongue daily., Disp: 90 tablet, Rfl: 3 .  escitalopram (LEXAPRO) 20 MG tablet, Take 1 tablet (20 mg total) by mouth daily., Disp: 90 tablet, Rfl: 3 .  FeFum-FePoly-FA-B Cmp-C-Biot (FOLIVANE-PLUS) CAPS, TAKE 1 CAPSULE BY MOUTH EVERY DAY, Disp: 90 capsule, Rfl: 3 .  Multiple Vitamins-Minerals ([facility] DAILY MULTIPLE PLUS MIN) TABS, TAKE 1 TABLET BY MOUTH DAILY., Disp: 90 tablet, Rfl: 3 .  Omega-3 Fatty Acids (OMEGA 3 PO), Take 1,200 mg by mouth daily. 684 mg, EPA 410, DHA 274, Disp: , Rfl:  .  PRESCRIPTION MEDICATION, Vision support, Disp: , Rfl:  .  vitamin C (ASCORBIC ACID) 500 MG tablet, Take 500 mg by mouth daily., Disp: , Rfl:   EXAM:  VITALS per patient if applicable:Ht  5\' 2"  (1.575 m)   LMP 06/11/2010   BMI 45.98 kg/m   GENERAL: alert, oriented, appears well and in no acute distress  HEENT: atraumatic, conjunctiva clear, no obvious abnormalities on inspection.  NECK: normal movements of the head and neck  LUNGS: on inspection no signs of respiratory distress, breathing rate appears normal, no obvious gross SOB, gasping or wheezing  CV: no obvious cyanosis  Jacqueline: moves all visible extremities without noticeable abnormality  PSYCH/NEURO: pleasant and cooperative, no obvious depression or anxiety, speech and thought processing  grossly intact  ASSESSMENT AND PLAN:  Discussed the following assessment and plan:  Orders Placed This Encounter  Procedures  . Comprehensive metabolic panel  . CBC  . Microalbumin / creatinine urine ratio  . Hemoglobin A1c  . Fructosamine  . Lipid panel  . Ferritin    Hyperlipidemia associated with type 2 diabetes mellitus (HCC) Continue atorvastatin 10 mg daily. We will arrange future labs and will adjust treatment, if needed, according to lipid panel result.  Major depressive disorder with single episode, in remission (HCC) Problem is still well controlled. No changes in current management. I think we can continue following annually, before if needed.  Diabetes mellitus type II, controlled (HCC)  Problem has been well controlled on nonpharmacologic treatment. Eye exam is up-to-date. Appropriate foot care is important, we will evaluate during next office visit. Further recommendation will be given according to A1c result.  Obesity, morbid, BMI 40.0-49.9 (HCC) She has lost Wt. Encouraged to continue a healthful diet. She will try to increase physical activity.  Iron deficiency anemia No changes in current management. Further recommendation will be given according to CBC results.  Lab appt will be arranged for fasting labs.  I discussed the assessment and treatment plan with the patient. Jacqueline Hawkins was  provided an opportunity to ask questions and all were answered. She agreed with the plan and demonstrated an understanding of the instructions.   Return in about 6 months (around 03/22/2020) for Needs fasting lab appt for this week if possible..    Charma Mocarski [place], MD

## 2019-09-21 NOTE — Assessment & Plan Note (Signed)
Continue atorvastatin 10 mg daily. We will arrange future labs and will adjust treatment, if needed, according to lipid panel result.

## 2019-09-21 NOTE — Assessment & Plan Note (Signed)
No changes in current management. Further recommendation will be given according to CBC results.

## 2019-09-21 NOTE — Assessment & Plan Note (Signed)
Problem is still well controlled. No changes in current management. I think we can continue following annually, before if needed.

## 2019-09-21 NOTE — Assessment & Plan Note (Signed)
She has lost Wt. Encouraged to continue a healthful diet. She will try to increase physical activity.

## 2019-09-21 NOTE — Assessment & Plan Note (Signed)
Problem has been well controlled on nonpharmacologic treatment. Eye exam is up-to-date. Appropriate foot care is important, we will evaluate during next office visit. Further recommendation will be given according to A1c result.

## 2019-09-25 ENCOUNTER — Encounter: Payer: Self-pay | Admitting: Family Medicine

## 2019-09-25 MED ORDER — BUPROPION HCL 100 MG PO TABS: ORAL_TABLET | ORAL | 2 refills | Status: DC

## 2019-10-07 ENCOUNTER — Other Ambulatory Visit: Payer: Self-pay | Admitting: Family Medicine

## 2019-10-07 DIAGNOSIS — D509 Iron deficiency anemia, unspecified: Secondary | ICD-10-CM

## 2019-10-11 ENCOUNTER — Encounter: Payer: Self-pay | Admitting: Family Medicine

## 2019-10-22 ENCOUNTER — Encounter: Payer: Self-pay | Admitting: Family Medicine

## 2019-10-25 NOTE — Addendum Note (Signed)
Addended by: Lerry Liner on: 10/25/2019 02:41 PM   Modules accepted: Orders

## 2019-10-27 ENCOUNTER — Other Ambulatory Visit: Payer: 59

## 2019-10-27 ENCOUNTER — Other Ambulatory Visit: Payer: Self-pay

## 2019-10-27 DIAGNOSIS — D509 Iron deficiency anemia, unspecified: Secondary | ICD-10-CM

## 2019-10-27 DIAGNOSIS — E119 Type 2 diabetes mellitus without complications: Secondary | ICD-10-CM

## 2019-10-27 DIAGNOSIS — E1169 Type 2 diabetes mellitus with other specified complication: Secondary | ICD-10-CM

## 2019-11-01 LAB — MICROALBUMIN / CREATININE URINE RATIO
Creatinine, Urine: 46 mg/dL (ref 20–275)
Microalb Creat Ratio: 9 mcg/mg creat (ref <30)
Microalb, Ur: 0.4 mg/dL

## 2019-11-01 LAB — CBC
HCT: 40.5 % (ref 35.0–45.0)
Hemoglobin: 13.3 g/dL (ref 11.7–15.5)
MCH: 32.4 pg (ref 27.0–33.0)
MCHC: 32.8 g/dL (ref 32.0–36.0)
MCV: 98.8 fL (ref 80.0–100.0)
MPV: 10.5 fL (ref 7.5–12.5)
Platelets: 240 10*3/uL (ref 140–400)
RBC: 4.1 10*6/uL (ref 3.80–5.10)
RDW: 12.9 % (ref 11.0–15.0)
WBC: 4.8 10*3/uL (ref 3.8–10.8)

## 2019-11-01 LAB — COMPREHENSIVE METABOLIC PANEL
AG Ratio: 1.7 (calc) (ref 1.0–2.5)
ALT: 19 U/L (ref 6–29)
AST: 23 U/L (ref 10–35)
Albumin: 3.8 g/dL (ref 3.6–5.1)
Alkaline phosphatase (APISO): 65 U/L (ref 37–153)
BUN: 10 mg/dL (ref 7–25)
CO2: 28 mmol/L (ref 20–32)
Calcium: 9 mg/dL (ref 8.6–10.4)
Chloride: 104 mmol/L (ref 98–110)
Creat: 0.68 mg/dL (ref 0.50–0.99)
Globulin: 2.3 g/dL (calc) (ref 1.9–3.7)
Glucose, Bld: 93 mg/dL (ref 65–99)
Potassium: 4.3 mmol/L (ref 3.5–5.3)
Sodium: 139 mmol/L (ref 135–146)
Total Bilirubin: 0.5 mg/dL (ref 0.2–1.2)
Total Protein: 6.1 g/dL (ref 6.1–8.1)

## 2019-11-01 LAB — HEMOGLOBIN A1C
Hgb A1c MFr Bld: 5.8 % of total Hgb — ABNORMAL HIGH (ref <5.7)
Mean Plasma Glucose: 120 (calc)
eAG (mmol/L): 6.6 (calc)

## 2019-11-01 LAB — LIPID PANEL
Cholesterol: 171 mg/dL (ref <200)
HDL: 65 mg/dL (ref >=50)
LDL Cholesterol (Calc): 89 mg/dL (calc)
Non-HDL Cholesterol (Calc): 106 mg/dL (calc) (ref <130)
Total CHOL/HDL Ratio: 2.6 (calc) (ref <5.0)
Triglycerides: 76 mg/dL (ref <150)

## 2019-11-01 LAB — FERRITIN: Ferritin: 59 ng/mL (ref 16–288)

## 2019-11-01 LAB — FRUCTOSAMINE: Fructosamine: 234 umol/L (ref 205–285)

## 2019-11-10 ENCOUNTER — Other Ambulatory Visit: Payer: Self-pay

## 2019-11-10 ENCOUNTER — Encounter: Payer: Self-pay | Admitting: Adult Health

## 2019-11-10 ENCOUNTER — Ambulatory Visit: Payer: 59 | Admitting: Adult Health

## 2019-11-10 DIAGNOSIS — G4733 Obstructive sleep apnea (adult) (pediatric): Secondary | ICD-10-CM | POA: Diagnosis not present

## 2019-11-10 NOTE — Assessment & Plan Note (Addendum)
Excellent control and compliance continue on nocturnal CPAP.  Patient education given  Plan  Patient Instructions  May try Dream wear nasal mask.  Keep up the good work Continue on CPAP at bedtime Work on healthy weight Do not drive if sleepy Follow-up in 6 months and as needed with Dr. Melida Quitter

## 2019-11-10 NOTE — Progress Notes (Signed)
@Patient  ID: , female    DOB: 13-Jul-1955, 64 y.o.   MRN: [id number]  Chief Complaint  Patient presents with  . Follow-up    OSA     Referring provider: [id number], Betty G, MD  HPI: 63 year old female followed for obstructive sleep apnea with original diagnosis in 1995 .  Original diagnosis was central sleep apnea.  Status post bariatric surgery with extensive weight loss.  Did come off of CPAP for significant amount of time. Sleep consult Aug 02, 2019 with excessive daytime sleepiness and snoring.  Patient was set up for a sleep study that showed severe obstructive sleep apnea with severe oxygen desaturations.  She was restarted on CPAP  TEST/EVENTS :  Home sleep study September 04, 2019 showed severe sleep apnea with AHI 65/hour and a SPO2 low 66%  11/10/2019 Follow up : OSA  Patient returns for a 33-month follow-up.  Patient was last seen for a sleep consult.  She had previously been diagnosed with central sleep apnea in 1995.  She then had bariatric surgery with extensive weight loss.  And was able to come off of CPAP for a significant mouth time.  Over the last several months has noticed excessive daytime sleepiness and snoring.  She had gained 50 pounds of weight back.  She was set up for home sleep study done on September 04, 2019 that showed severe sleep apnea with AHI at 65/hour and SPO2 low at 66%.  She was recommend to start back on CPAP.  Patient says she is doing better.  She is wearing her CPAP each night.  She is on auto CPAP 5 to 20 cm H2O.  CPAP download shows excellent compliance and control with daily average usage at 7 hours.  AHI 3.8.  Positive mask leak.   No Known Allergies  Immunization History  Administered Date(s) Administered  . Influenza Whole 12/29/2007, 12/20/2008  . Influenza,inj,Quad PF,6+ Mos 12/29/2012, 02/10/2014  . PFIZER SARS-COV-2 Vaccination 06/09/2019, 07/10/2019  . Pneumococcal Polysaccharide-23 03/26/2003, 01/02/2009  . Td 03/26/2003  . Tdap  02/10/2014    Past Medical History:  Diagnosis Date  . Asthma   . Depression   . Diabetes mellitus    no meds, AIC's normal since gastric bypass  . Hyperlipidemia   . OA (osteoarthritis)   . Obesity   . Venous insufficiency     Tobacco History: Social History   Tobacco Use  Smoking Status Former Smoker  . Types: Cigarettes  . Quit date: 03/26/1975  . Years since quitting: 44.6  Smokeless Tobacco Never Used   Counseling given: Not Answered   Outpatient Medications Prior to Visit  Medication Sig Dispense Refill  . albuterol (VENTOLIN HFA) 108 (90 Base) MCG/ACT inhaler Inhale 2 puffs into the lungs every 6 (six) hours as needed. 18 g 0  . aspirin 81 MG tablet Take 1 tablet (81 mg total) by mouth daily. 90 tablet 3  . atorvastatin (LIPITOR) 10 MG tablet Take 1 tablet (10 mg total) by mouth daily. 90 tablet 3  . Bacillus Coagulans-Inulin (PROBIOTIC-PREBIOTIC) 1-250 BILLION-MG CAPS Take 1 capsule by mouth daily.    05/24/1975 buPROPion (WELLBUTRIN) 100 MG tablet TAKE 3 TABLETS (100 MG TOTAL) DAILY. 270 tablet 2  . cetirizine (ZYRTEC) 10 MG tablet Take 10 mg by mouth daily.    . Cyanocobalamin 1000 MCG SUBL Place 1 tablet (1,000 mcg total) under the tongue daily. 90 tablet 3  . escitalopram (LEXAPRO) 20 MG tablet Take 1 tablet (20 mg total) by  mouth daily. 90 tablet 3  . FeFum-FePoly-FA-B Cmp-C-Biot (FOLIVANE-PLUS) CAPS TAKE 1 CAPSULE BY MOUTH EVERY DAY 90 capsule 3  . Multiple Vitamins-Minerals ([facility]S DAILY MULTIPLE PLUS MIN) TABS TAKE 1 TABLET BY MOUTH DAILY. 90 tablet 3  . Omega-3 Fatty Acids (OMEGA 3 PO) Take 1,200 mg by mouth daily. 684 mg, EPA 410, DHA 274    . PRESCRIPTION MEDICATION Vision support    . vitamin C (ASCORBIC ACID) 500 MG tablet Take 500 mg by mouth daily.     No facility-administered medications prior to visit.     Review of Systems:   Constitutional:   No  weight loss, night sweats,  Fevers, chills, fatigue, or  lassitude.  HEENT:   No headaches,  Difficulty  swallowing,  Tooth/dental problems, or  Sore throat,                No sneezing, itching, ear ache, nasal congestion, post nasal drip,   CV:  No chest pain,  Orthopnea, PND, swelling in lower extremities, anasarca, dizziness, palpitations, syncope.   GI  No heartburn, indigestion, abdominal pain, nausea, vomiting, diarrhea, change in bowel habits, loss of appetite, bloody stools.   Resp: No shortness of breath with exertion or at rest.  No excess mucus, no productive cough,  No non-productive cough,  No coughing up of blood.  No change in color of mucus.  No wheezing.  No chest wall deformity  Skin: no rash or lesions.  GU: no dysuria, change in color of urine, no urgency or frequency.  No flank pain, no hematuria   MS:  No joint pain or swelling.  No decreased range of motion.  No back pain.    Physical Exam  BP 118/74 (BP Location: Left Arm, Cuff Size: Normal)   Pulse 92   Temp 98.2 F (36.8 C) (Temporal)   Ht 5\' 2"  (1.575 m)   Wt 255 lb 6.4 oz (115.8 kg)   LMP 06/11/2010   SpO2 98% Comment: RA  BMI 46.71 kg/m   GEN: A/Ox3; pleasant , NAD, well nourished BMI 46    HEENT:  [place]/AT,   NOSE-clear, THROAT-clear, no lesions, no postnasal drip or exudate noted.   NECK:  Supple w/ fair ROM; no JVD; normal carotid impulses w/o bruits; no thyromegaly or nodules palpated; no lymphadenopathy.    RESP  Clear  P & A; w/o, wheezes/ rales/ or rhonchi. no accessory muscle use, no dullness to percussion  CARD:  RRR, no m/r/g, no peripheral edema, pulses intact, no cyanosis or clubbing.  GI:   Soft & nt; nml bowel sounds; no organomegaly or masses detected.   Musco: Warm bil, no deformities or joint swelling noted.   Neuro: alert, no focal deficits noted.    Skin: Warm, no lesions or rashes     BMET  BNP No results found for: BNP  ProBNP No results found for: PROBNP  Imaging: No results found.    No flowsheet data found.  No results found for:  NITRICOXIDE      Assessment & Plan:   OSA (obstructive sleep apnea) Excellent control and compliance continue on nocturnal CPAP.  Patient education given  Plan  Patient Instructions  May try Dream wear nasal mask.  Keep up the good work Continue on CPAP at bedtime Work on healthy weight Do not drive if sleepy Follow-up in 6 months and as needed with Dr. 06/13/2010      Obesity, morbid, BMI 40.0-49.9 (HCC) Wt loss  Rubye Oaks, NP 11/10/2019

## 2019-11-10 NOTE — Patient Instructions (Addendum)
May try Dream wear nasal mask.  Keep up the good work Continue on CPAP at bedtime Work on healthy weight Do not drive if sleepy Follow-up in 6 months and as needed with Dr. Melida Quitter

## 2019-11-10 NOTE — Assessment & Plan Note (Signed)
Wt loss  

## 2019-11-13 ENCOUNTER — Other Ambulatory Visit: Payer: Self-pay | Admitting: Family Medicine

## 2019-11-19 NOTE — Telephone Encounter (Signed)
Pt dropped off forms and they were placed in TP box.

## 2019-11-22 NOTE — Telephone Encounter (Signed)
Shyerl, please advise if you know if forms have been filled out by TP on pt and if so, have they been faxed back yet for pt.

## 2019-11-23 NOTE — Telephone Encounter (Signed)
Forms are in Jacqueline Hawkins to sign folder. Waiting for her to complete. Please hold in triage for follow up.

## 2019-11-30 NOTE — Telephone Encounter (Signed)
Paperwork has been completed and faxed to [place] [employer] [place]. [phone]

## 2019-12-03 NOTE — Telephone Encounter (Signed)
Fax sucessful, sent to scan.

## 2019-12-06 ENCOUNTER — Telehealth: Payer: Self-pay | Admitting: Adult Health

## 2019-12-06 NOTE — Telephone Encounter (Signed)
Called and left message for Jacqueline Hawkins to return call.  [employer] is calling to ask about a letter from 11/10/2019. This letter recommended that Jacqueline Hawkins continue to work from home based on her medical conditions and risk of covid. Contact person, Jacqueline Hawkins, is asking about why OSA should qualify her to work from home. The letter list OSA, asthma, obesity, as well as other conditions that her provider, Jacqueline Hawkins has reviewed and confirmed she is at higher risk for a poor covid outcome and that she should continue to work from home.   OSA is not the only condition Jacqueline Hawkins has, but it does increase her risk for elevated blood pressure, heart attack, stroke, abnormal heart beats, and diabetes. Jacqueline Hawkins also has diabetes that was not mentioned in the letter.  The combination of these conditions make Jacqueline Hawkins at risk for a poor outcome from Covid. If [facility] has any further questions please direct them to her provider, Jacqueline Hawkins.

## 2019-12-07 NOTE — Telephone Encounter (Signed)
Contacted Maggie at [facility] and reviewed the recommendations for patient to stay home to work. Contacted patient and discussed the conversation.

## 2020-02-25 ENCOUNTER — Other Ambulatory Visit: Payer: Self-pay | Admitting: Family Medicine

## 2020-02-25 DIAGNOSIS — F325 Major depressive disorder, single episode, in full remission: Secondary | ICD-10-CM

## 2020-04-07 ENCOUNTER — Other Ambulatory Visit: Payer: Self-pay

## 2020-04-07 DIAGNOSIS — F325 Major depressive disorder, single episode, in full remission: Secondary | ICD-10-CM

## 2020-04-07 MED ORDER — BUPROPION HCL 100 MG PO TABS: ORAL_TABLET | ORAL | 1 refills | Status: DC

## 2020-09-11 ENCOUNTER — Other Ambulatory Visit: Payer: Self-pay

## 2020-09-11 ENCOUNTER — Emergency Department (HOSPITAL_BASED_OUTPATIENT_CLINIC_OR_DEPARTMENT_OTHER): Payer: 59 | Admitting: Radiology

## 2020-09-11 ENCOUNTER — Observation Stay (HOSPITAL_BASED_OUTPATIENT_CLINIC_OR_DEPARTMENT_OTHER)
Admission: EM | Admit: 2020-09-11 | Discharge: 2020-09-12 | Disposition: A | Discharge status: Home / Self Care | Payer: 59 | Attending: General Surgery | Admitting: General Surgery

## 2020-09-11 ENCOUNTER — Emergency Department (HOSPITAL_BASED_OUTPATIENT_CLINIC_OR_DEPARTMENT_OTHER): Payer: 59

## 2020-09-11 ENCOUNTER — Encounter (HOSPITAL_BASED_OUTPATIENT_CLINIC_OR_DEPARTMENT_OTHER): Payer: Self-pay

## 2020-09-11 DIAGNOSIS — Z7982 Long term (current) use of aspirin: Secondary | ICD-10-CM | POA: Diagnosis not present

## 2020-09-11 DIAGNOSIS — E119 Type 2 diabetes mellitus without complications: Secondary | ICD-10-CM | POA: Diagnosis not present

## 2020-09-11 DIAGNOSIS — K561 Intussusception: Secondary | ICD-10-CM | POA: Diagnosis not present

## 2020-09-11 DIAGNOSIS — J45909 Unspecified asthma, uncomplicated: Secondary | ICD-10-CM | POA: Insufficient documentation

## 2020-09-11 DIAGNOSIS — Z9884 Bariatric surgery status: Secondary | ICD-10-CM | POA: Insufficient documentation

## 2020-09-11 DIAGNOSIS — Z87891 Personal history of nicotine dependence: Secondary | ICD-10-CM | POA: Insufficient documentation

## 2020-09-11 DIAGNOSIS — Z20822 Contact with and (suspected) exposure to covid-19: Secondary | ICD-10-CM | POA: Insufficient documentation

## 2020-09-11 DIAGNOSIS — Z96642 Presence of left artificial hip joint: Secondary | ICD-10-CM | POA: Diagnosis not present

## 2020-09-11 DIAGNOSIS — R109 Unspecified abdominal pain: Secondary | ICD-10-CM | POA: Diagnosis present

## 2020-09-11 LAB — URINALYSIS, ROUTINE W REFLEX MICROSCOPIC
Bilirubin Urine: NEGATIVE
Glucose, UA: NEGATIVE mg/dL
Hgb urine dipstick: NEGATIVE
Nitrite: POSITIVE — AB
Protein, ur: NEGATIVE mg/dL
Specific Gravity, Urine: 1.016 (ref 1.005–1.030)
WBC, UA: >50 WBC/hpf — ABNORMAL HIGH (ref 0–5)
pH: 5.5 (ref 5.0–8.0)

## 2020-09-11 LAB — CBC WITH DIFFERENTIAL/PLATELET
Abs Immature Granulocytes: 0.01 10*3/uL (ref 0.00–0.07)
Basophils Absolute: 0 10*3/uL (ref 0.0–0.1)
Basophils Relative: 1 %
Eosinophils Absolute: 0.1 10*3/uL (ref 0.0–0.5)
Eosinophils Relative: 2 %
HCT: 42.9 % (ref 36.0–46.0)
Hemoglobin: 14.2 g/dL (ref 12.0–15.0)
Immature Granulocytes: 0 %
Lymphocytes Relative: 24 %
Lymphs Abs: 1.5 10*3/uL (ref 0.7–4.0)
MCH: 32.6 pg (ref 26.0–34.0)
MCHC: 33.1 g/dL (ref 30.0–36.0)
MCV: 98.4 fL (ref 80.0–100.0)
Monocytes Absolute: 0.4 10*3/uL (ref 0.1–1.0)
Monocytes Relative: 6 %
Neutro Abs: 4.3 10*3/uL (ref 1.7–7.7)
Neutrophils Relative %: 67 %
Platelets: 228 10*3/uL (ref 150–400)
RBC: 4.36 MIL/uL (ref 3.87–5.11)
RDW: 14.4 % (ref 11.5–15.5)
WBC: 6.4 10*3/uL (ref 4.0–10.5)
nRBC: 0 % (ref 0.0–0.2)

## 2020-09-11 LAB — COMPREHENSIVE METABOLIC PANEL
ALT: 20 U/L (ref 0–44)
AST: 24 U/L (ref 15–41)
Albumin: 4 g/dL (ref 3.5–5.0)
Alkaline Phosphatase: 63 U/L (ref 38–126)
Anion gap: 10 (ref 5–15)
BUN: 14 mg/dL (ref 8–23)
CO2: 26 mmol/L (ref 22–32)
Calcium: 9 mg/dL (ref 8.9–10.3)
Chloride: 102 mmol/L (ref 98–111)
Creatinine, Ser: 0.71 mg/dL (ref 0.44–1.00)
GFR, Estimated: >60 mL/min (ref >60)
Glucose, Bld: 170 mg/dL — ABNORMAL HIGH (ref 70–99)
Potassium: 4.1 mmol/L (ref 3.5–5.1)
Sodium: 138 mmol/L (ref 135–145)
Total Bilirubin: 0.5 mg/dL (ref 0.3–1.2)
Total Protein: 7 g/dL (ref 6.5–8.1)

## 2020-09-11 LAB — TROPONIN I (HIGH SENSITIVITY)
Troponin I (High Sensitivity): 6 ng/L (ref <18)
Troponin I (High Sensitivity): 7 ng/L (ref <18)

## 2020-09-11 LAB — RESP PANEL BY RT-PCR (FLU A&B, COVID) ARPGX2
Influenza A by PCR: NEGATIVE
Influenza B by PCR: NEGATIVE
SARS Coronavirus 2 by RT PCR: NEGATIVE

## 2020-09-11 LAB — LIPASE, BLOOD: Lipase: 31 U/L (ref 11–51)

## 2020-09-11 LAB — LACTIC ACID, PLASMA: Lactic Acid, Venous: 0.9 mmol/L (ref 0.5–1.9)

## 2020-09-11 MED ORDER — DIPHENHYDRAMINE HCL 50 MG/ML IJ SOLN: 12.5000 mg | Freq: Four times a day (QID) | INTRAMUSCULAR | Status: DC | PRN

## 2020-09-11 MED ORDER — MORPHINE SULFATE (PF) 4 MG/ML IV SOLN
4.0000 mg | Freq: Once | INTRAVENOUS | Status: AC
  Administered 2020-09-11: 4 mg via INTRAVENOUS
  Filled 2020-09-11: qty 1

## 2020-09-11 MED ORDER — ENOXAPARIN SODIUM 40 MG/0.4ML IJ SOSY
40.0000 mg | PREFILLED_SYRINGE | INTRAMUSCULAR | Status: DC
  Administered 2020-09-11: 40 mg via SUBCUTANEOUS
  Filled 2020-09-11: qty 0.4

## 2020-09-11 MED ORDER — SODIUM CHLORIDE 0.9 % IV SOLN: INTRAVENOUS | Status: DC

## 2020-09-11 MED ORDER — DIPHENHYDRAMINE HCL 12.5 MG/5ML PO ELIX: 12.5000 mg | ORAL_SOLUTION | Freq: Four times a day (QID) | ORAL | Status: DC | PRN

## 2020-09-11 MED ORDER — HYDRALAZINE HCL 20 MG/ML IJ SOLN: 10.0000 mg | INTRAMUSCULAR | Status: DC | PRN

## 2020-09-11 MED ORDER — ACETAMINOPHEN 650 MG RE SUPP: 650.0000 mg | Freq: Four times a day (QID) | RECTAL | Status: DC | PRN

## 2020-09-11 MED ORDER — SODIUM CHLORIDE 0.9 % IV SOLN
1.0000 g | INTRAVENOUS | Status: DC
  Administered 2020-09-12: 1 g via INTRAVENOUS
  Filled 2020-09-11: qty 1

## 2020-09-11 MED ORDER — ONDANSETRON 4 MG PO TBDP: 4.0000 mg | ORAL_TABLET | Freq: Four times a day (QID) | ORAL | Status: DC | PRN

## 2020-09-11 MED ORDER — MORPHINE SULFATE (PF) 2 MG/ML IV SOLN
2.0000 mg | INTRAVENOUS | Status: DC | PRN
  Administered 2020-09-11: 2 mg via INTRAVENOUS
  Filled 2020-09-11: qty 1

## 2020-09-11 MED ORDER — ACETAMINOPHEN 325 MG PO TABS: 650.0000 mg | ORAL_TABLET | Freq: Four times a day (QID) | ORAL | Status: DC | PRN

## 2020-09-11 MED ORDER — OXYCODONE HCL 5 MG PO TABS: 5.0000 mg | ORAL_TABLET | ORAL | Status: DC | PRN

## 2020-09-11 MED ORDER — ONDANSETRON HCL 4 MG/2ML IJ SOLN
4.0000 mg | Freq: Four times a day (QID) | INTRAMUSCULAR | Status: DC | PRN
  Administered 2020-09-11: 4 mg via INTRAVENOUS
  Filled 2020-09-11: qty 2

## 2020-09-11 MED ORDER — PANTOPRAZOLE SODIUM 40 MG IV SOLR
40.0000 mg | Freq: Every day | INTRAVENOUS | Status: DC
  Administered 2020-09-11: 40 mg via INTRAVENOUS
  Filled 2020-09-11: qty 40

## 2020-09-11 MED ORDER — SODIUM CHLORIDE 0.9 % IV SOLN
1.0000 g | Freq: Once | INTRAVENOUS | Status: AC
  Administered 2020-09-11: 1 g via INTRAVENOUS
  Filled 2020-09-11: qty 10

## 2020-09-11 MED ORDER — ONDANSETRON HCL 4 MG/2ML IJ SOLN
4.0000 mg | Freq: Once | INTRAMUSCULAR | Status: AC
  Administered 2020-09-11: 4 mg via INTRAVENOUS
  Filled 2020-09-11: qty 2

## 2020-09-11 NOTE — ED Notes (Signed)
Patient transported to X-ray 

## 2020-09-11 NOTE — ED Notes (Signed)
Telephone report called to Susy Frizzle, Charity fundraiser, Press photographer at [employer] ED.

## 2020-09-11 NOTE — Plan of Care (Signed)
  Problem: Pain Managment: Goal: General experience of comfort will improve Outcome: Progressing   

## 2020-09-11 NOTE — ED Notes (Signed)
Patient arrived via [facility] for a general surgery consult.

## 2020-09-11 NOTE — ED Provider Notes (Signed)
Blood pressure 114/75, pulse 90, temperature 98.5 F (36.9 C), resp. rate 19, height 5\' 2"  (1.575 m), weight 113.4 kg, last menstrual period 06/11/2010, SpO2 93 %.  Assuming care from Dr. 06/13/2010.  In short, Jacqueline Hawkins is a 65 y.o. female with a chief complaint of Chest Pain and Shortness of Breath .  Refer to the original H&P for additional details.  The current plan of care is to follow up with General Surgery with CT result.  08:45 AM  After discussion with General Surgery will transfer to the [facility] for surgery evaluation there by Dr. [facility].   Dr. Andrey Campanile accepting to the [facility]. Will contact [name] for transport.   09:15 AM  Patient'c COVID is negative. Updated patient. She is pain-free currently. No nausea or vomiting.   09:45 AM  Called [facility] for ETA. They are send a truck to [facility] next. Patient remains asymptomatic with stable vitals.     [place], MD 09/11/20 [phone]

## 2020-09-11 NOTE — ED Notes (Signed)
Telephone report called to [facility] with [facility].

## 2020-09-11 NOTE — H&P (Addendum)
Shandora Koogler [facility] 08/17/1955  [id number].    Requesting MD: Dr. Particia Nearing Chief Complaint/Reason for Consult: Intussusception   HPI: Jacqueline Hawkins is a 65 y.o. female with a history of a gastric bypass by Dr. Daphine Deutscher in 2012 who presented to [facility] ED on 6/20 with abdominal and flank pain.  Patient reports she was awoken at 2 AM with sudden onset of left sided flank pain that radiated around to her epigastrium and into her chest with associated with nausea.  She tried drinking water on 2 occasion but this was followed by emesis.  Pain gradually worsened to a 9/10, prompting her to go to the ED.  She denies history of similar symptoms in the past.  She underwent CT without contrast that showed 10 cm intussusception of small bowel located at the downstream small bowel anastomosis.  No free air or free fluid.  She has been afebrile without tachycardia or hypotension since presentation.  WBC 6.4.  Lactic acid 0.9.  She reports after receiving IV Zofran and morphine at 0615 her symptoms have completely resolved.  She currently is without any flank/abdominal pain or nausea.  She was transferred to [facility] ED for evaluation.  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  Respiratory:  Negative for cough and shortness of breath.   Cardiovascular:  Positive for chest pain and leg swelling (she reports chronic swelling of the LLE since hip surgery several years ago - she reports prior US negative for DVT).  Gastrointestinal:  Positive for nausea and vomiting.  Genitourinary:  Negative for dysuria, flank pain, frequency, hematuria and urgency.       Reports her urine smells foul  Psychiatric/Behavioral:  Negative for substance abuse.   All other systems reviewed and are  negative.  Family History  Problem Relation Age of Onset   Diabetes Father    Heart disease Father    Thyroid disease Mother    Breast cancer Maternal Aunt     Past Medical History:  Diagnosis Date   Asthma    Depression    Diabetes mellitus    no meds, AIC's normal since gastric bypass   Hyperlipidemia    OA (osteoarthritis)    Obesity    Venous insufficiency     Past Surgical History:  Procedure Laterality Date   GASTRIC BYPASS     TOTAL HIP ARTHROPLASTY Left     Social History:  reports that she quit smoking about 45 years ago. Her smoking use included cigarettes. She has never used smokeless tobacco. She reports current alcohol use of about 1.0 standard drink of alcohol per week. She reports that she does not use drugs.  Allergies: No Known Allergies  (Not in a hospital admission)    Physical Exam: Blood pressure (!) 115/94, pulse 87, temperature 98.4 F (36.9 C), temperature source Oral, resp. rate 18, height 5\' 2"  (1.575 m), weight 113.4 kg, last menstrual period 06/11/2010, SpO2 96 %. General: pleasant, WD/WN white female who is laying in bed in NAD HEENT: head is normocephalic, atraumatic.  Sclera are noninjected.  PERRL.  Ears and nose without any masses or lesions.  Mouth is pink and moist. Dentition fair Heart: regular, rate, and rhythm.  Normal s1,s2. No obvious murmurs, gallops, or rubs noted.  Palpable pedal pulses  bilaterally  Lungs: CTAB, no wheezes, rhonchi, or rales noted.  Respiratory effort nonlabored Abd: Soft, ND, completely NT on deep and light palpation, +BS, no masses, hernias, or organomegaly. Prior abdominal incisions are well healed. MS: Some LLE swelling that she reports is chronic since her hip surgery several years ago and stable. Calves soft and nontender. Moves all extremities Skin: warm and dry with no masses, lesions, or rashes Psych: A&Ox4 with an appropriate affect Neuro: cranial nerves grossly intact, equal strength in BUE/BLE  bilaterally, normal speech, thought process intact, gait not assessed   Results for orders placed or performed during the hospital encounter of 09/11/20 (from the past 48 hour(s))  CBC with Differential     Status: None   Collection Time: 09/11/20  5:15 AM  Result Value Ref Range   WBC 6.4 4.0 - 10.5 K/uL   RBC 4.36 3.87 - 5.11 MIL/uL   Hemoglobin 14.2 12.0 - 15.0 g/dL   HCT 16.9 67.8 - 93.8 %   MCV 98.4 80.0 - 100.0 fL   MCH 32.6 26.0 - 34.0 pg   MCHC 33.1 30.0 - 36.0 g/dL   RDW 10.1 75.1 - 02.5 %   Platelets 228 150 - 400 K/uL   nRBC 0.0 0.0 - 0.2 %   Neutrophils Relative % 67 %   Neutro Abs 4.3 1.7 - 7.7 K/uL   Lymphocytes Relative 24 %   Lymphs Abs 1.5 0.7 - 4.0 K/uL   Monocytes Relative 6 %   Monocytes Absolute 0.4 0.1 - 1.0 K/uL   Eosinophils Relative 2 %   Eosinophils Absolute 0.1 0.0 - 0.5 K/uL   Basophils Relative 1 %   Basophils Absolute 0.0 0.0 - 0.1 K/uL   Immature Granulocytes 0 %   Abs Immature Granulocytes 0.01 0.00 - 0.07 K/uL    Comment: Performed at [facility], [address] [place], [place] [address]  Troponin I (High Sensitivity)     Status: None   Collection Time: 09/11/20  5:15 AM  Result Value Ref Range   Troponin I (High Sensitivity) 6 <18 ng/L    Comment: (NOTE) Elevated high sensitivity troponin I (hsTnI) values and significant  changes across serial measurements may suggest ACS but many other  chronic and acute conditions are known to elevate hsTnI results.  Refer to the "Links" section for chest pain algorithms and additional  guidance. Performed at [facility], [address] [place], [place] [address]   Lipase, blood     Status: None   Collection Time: 09/11/20  5:26 AM  Result Value Ref Range   Lipase 31 11 - 51 U/L    Comment: Performed at [facility], [address], [place], [place] [address]  Comprehensive metabolic panel     Status: Abnormal   Collection Time:  09/11/20  5:26 AM  Result Value Ref Range   Sodium 138 135 - 145 mmol/L   Potassium 4.1 3.5 - 5.1 mmol/L   Chloride 102 98 - 111 mmol/L   CO2 26 22 - 32 mmol/L   Glucose, Bld 170 (H) 70 - 99 mg/dL    Comment: Glucose reference range applies only to samples taken after fasting for at least 8 hours.   BUN 14 8 - 23 mg/dL   Creatinine, Ser 4.31 0.44 - 1.00 mg/dL   Calcium 9.0 8.9 - 54.0 mg/dL   Total Protein 7.0 6.5 - 8.1 g/dL   Albumin 4.0 3.5 - 5.0 g/dL   AST 24 15 -  41 U/L   ALT 20 0 - 44 U/L   Alkaline Phosphatase 63 38 - 126 U/L   Total Bilirubin 0.5 0.3 - 1.2 mg/dL   GFR, Estimated >45 >40 mL/min    Comment: (NOTE) Calculated using the CKD-EPI Creatinine Equation (2021)    Anion gap 10 5 - 15    Comment: Performed at [facility], [address] [place], [place] [address]  Urinalysis, Routine w reflex microscopic Urine, Clean Catch     Status: Abnormal   Collection Time: 09/11/20  5:26 AM  Result Value Ref Range   Color, Urine YELLOW YELLOW   APPearance HAZY (A) CLEAR   Specific Gravity, Urine 1.016 1.005 - 1.030   pH 5.5 5.0 - 8.0   Glucose, UA NEGATIVE NEGATIVE mg/dL   Hgb urine dipstick NEGATIVE NEGATIVE   Bilirubin Urine NEGATIVE NEGATIVE   Ketones, ur TRACE (A) NEGATIVE mg/dL   Protein, ur NEGATIVE NEGATIVE mg/dL   Nitrite POSITIVE (A) NEGATIVE   Leukocytes,Ua LARGE (A) NEGATIVE   RBC / HPF 0-5 0 - 5 RBC/hpf   WBC, UA >50 (H) 0 - 5 WBC/hpf   Bacteria, UA MANY (A) NONE SEEN   Squamous Epithelial / LPF 0-5 0 - 5   Hyaline Casts, UA PRESENT     Comment: Performed at [facility], [address] [place], [place] [address]  Troponin I (High Sensitivity)     Status: None   Collection Time: 09/11/20  7:06 AM  Result Value Ref Range   Troponin I (High Sensitivity) 7 <18 ng/L    Comment: (NOTE) Elevated high sensitivity troponin I (hsTnI) values and significant  changes across serial measurements may suggest ACS but many other   chronic and acute conditions are known to elevate hsTnI results.  Refer to the "Links" section for chest pain algorithms and additional  guidance. Performed at [facility], [address] [place], [place] [address]   Lactic acid, plasma     Status: None   Collection Time: 09/11/20  7:32 AM  Result Value Ref Range   Lactic Acid, Venous 0.9 0.5 - 1.9 mmol/L    Comment: Performed at [facility], [address], [place], [place] [address]  Resp Panel by RT-PCR (Flu A&B, Covid) Nasopharyngeal Swab     Status: None   Collection Time: 09/11/20  7:32 AM   Specimen: Nasopharyngeal Swab; Nasopharyngeal(NP) swabs in vial transport medium  Result Value Ref Range   SARS Coronavirus 2 by RT PCR NEGATIVE NEGATIVE    Comment: (NOTE) SARS-CoV-2 target nucleic acids are NOT DETECTED.  The SARS-CoV-2 RNA is generally detectable in upper respiratory specimens during the acute phase of infection. The lowest concentration of SARS-CoV-2 viral copies this assay can detect is 138 copies/mL. A negative result does not preclude SARS-Cov-2 infection and should not be used as the sole basis for treatment or other patient management decisions. A negative result may occur with  improper specimen collection/handling, submission of specimen other than nasopharyngeal swab, presence of viral mutation(s) within the areas targeted by this assay, and inadequate number of viral copies(<138 copies/mL). A negative result must be combined with clinical observations, patient history, and epidemiological information. The expected result is Negative.  Fact Sheet for Patients:  BloggerCourse.com  Fact Sheet for Healthcare Providers:  SeriousBroker.it  This test is no t yet approved or cleared by the [place] FDA and  has been authorized for detection and/or diagnosis of SARS-CoV-2 by FDA under an Emergency Use  Authorization  (EUA). This EUA will remain  in effect (meaning this test can be used) for the duration of the COVID-19 declaration under Section 564(b)(1) of the Act, 21 U.S.C.section 360bbb-3(b)(1), unless the authorization is terminated  or revoked sooner.       Influenza A by PCR NEGATIVE NEGATIVE   Influenza B by PCR NEGATIVE NEGATIVE    Comment: (NOTE) The Xpert Xpress SARS-CoV-2/FLU/RSV plus assay is intended as an aid in the diagnosis of influenza from Nasopharyngeal swab specimens and should not be used as a sole basis for treatment. Nasal washings and aspirates are unacceptable for Xpert Xpress SARS-CoV-2/FLU/RSV testing.  Fact Sheet for Patients: BloggerCourse.comhttps://www.fda.gov/media/152166/download  Fact Sheet for Healthcare Providers: SeriousBroker.ithttps://www.fda.gov/media/152162/download  This test is not yet approved or cleared by the [place] FDA and has been authorized for detection and/or diagnosis of SARS-CoV-2 by FDA under an Emergency Use Authorization (EUA). This EUA will remain in effect (meaning this test can be used) for the duration of the COVID-19 declaration under Section 564(b)(1) of the Act, 21 U.S.C. section 360bbb-3(b)(1), unless the authorization is terminated or revoked.  Performed at [facility], [address], [place], [place] [id number]    DG Chest 2 View  Result Date: 09/11/2020 CLINICAL DATA:  65 year old female with chest pain since 0200 hours. EXAM: CHEST - 2 VIEW COMPARISON:  Chest radiographs 04/11/2014 and earlier. FINDINGS: PA and lateral views. Mildly increased tortuosity of the thoracic aorta since 2016. Other mediastinal contours are within normal limits. Visualized tracheal air column is within normal limits. EKG leads and wires overlie the chest. No pneumothorax, pulmonary edema, pleural effusion or confluent pulmonary opacity identified. No acute osseous abnormality identified. Negative visible bowel gas pattern. IMPRESSION: No acute  cardiopulmonary abnormality. Electronically Signed   By: Odessa FlemingH  Hall M.D.   On: 09/11/2020 06:13   CT Renal Stone Study  Result Date: 09/11/2020 CLINICAL DATA:  65 year old female with left flank and back pain since 0200 hours. Pain now radiating to the chest with shortness of breath. EXAM: CT ABDOMEN AND PELVIS WITHOUT CONTRAST TECHNIQUE: Multidetector CT imaging of the abdomen and pelvis was performed following the standard protocol without IV contrast. COMPARISON:  Chest radiographs 0539 hours. FINDINGS: Lower chest: Mildly tortuous descending thoracic aorta. No cardiomegaly or pericardial effusion. Negative lung bases. Hepatobiliary: Negative noncontrast liver. Small layering gallstones but no pericholecystic inflammation. Pancreas: Negative. Spleen: Small calcified splenic granulomas (benign), otherwise negative. Adrenals/Urinary Tract: Normal adrenal glands. Nonobstructed kidneys with no nephrolithiasis. 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 Decompressed ureters. Diminutive and unremarkable urinary bladder. Stomach/Bowel: Redundant but decompressed large bowel from the mid abdomen through to the pelvis. Redundant right colon with mild retained stool. Normal appendix on coronal image 62 coursing to the midline. Negative terminal ileum. Decompressed small bowel loops in the pelvis and right abdomen. Previous Roux-en-Y type gastric bypass with gastrojejunostomy. At the downstream small bowel anastomosis there is a prominent jejunal-jejunal intussusception with associated dilated intussuscipiens loop (coronal image 75). About 10 cm of small bowel is into susceptible. Upstream small bowel in the left abdomen and upper quadrant also mildly dilated. But the downstream small bowel is decompressed. No free air or free fluid. Minimal associated mesenteric inflammation. Vascular/Lymphatic: Normal caliber abdominal aorta. Mild Aortoiliac calcified atherosclerosis. No lymphadenopathy.  Reproductive: Negative noncontrast appearance. Other: No pelvic free fluid. Musculoskeletal: Grade 1 anterolisthesis of L4 on L5 with moderate to severe lower lumbar facet arthropathy. Lower thoracic endplate osteophytes. Left total hip arthroplasty. No acute osseous  abnormality identified. IMPRESSION: 1. Positive for a 10 cm intussusception of small bowel located at the downstream small bowel anastomosis status post a Roux-en-Y type bypass. Dilated intussuscipiens loop and mild associated upstream small bowel obstruction. No free air or free fluid. 2. No other acute or inflammatory process identified. Cholelithiasis without CT evidence of acute cholecystitis. No urologic calculus or obstructive uropathy. Electronically Signed   By: Odessa Fleming M.D.   On: 09/11/2020 07:05    Anti-infectives (From admission, onward)    Start     Dose/Rate Route Frequency Ordered Stop   09/11/20 0715  cefTRIAXone (ROCEPHIN) 1 g in sodium chloride 0.9 % 100 mL IVPB        1 g 200 mL/hr over 30 Minutes Intravenous  Once 09/11/20 0626 09/11/20 [id number]        Assessment/Plan Intussusception in setting of prior gastric bypass DAWNA JAKES is a 65 y.o. female with a history of a gastric bypass by Dr. Daphine Deutscher in 2012 who presented with L flank pain w/ radiation to her epigastrium w/ associated n/v and was found to have 10 cm intussusception of small bowel located at the downstream small bowel anastomosis on CT. There was no free air or free fluid.  She has been afebrile without tachycardia or hypotension since presentation.  WBC 6.4.  Lactic acid 0.9. All of her symptoms have completely resolved and she is non-tender on exam. Will plan for admission for observation. Will discuss with Dr. Andrey Campanile about further plans. Further recs to follow.   Addendum: Spoke with Dr. Andrey Campanile and Dr. Daphine Deutscher about the patients case. Will plan to observe overnight and if still doing well consider PO challenge vs repeat scan in the AM.   FEN - NPO,  IVF VTE - SCDs, Lovenox ID - Rocephin for possible UTI Foley - None  Possible UTI - UA suspicous for UTI. She was started on Rocephin. She denies any symptoms except that her urine smells foul. Ucx ordered HLD  Jacinto Halim, [facility] 09/11/2020, 12:05 PM Please see Amion for pager number during day hours 7:00am-4:30pm

## 2020-09-11 NOTE — ED Provider Notes (Signed)
[facility] EMERGENCY DEPT Provider Note   CSN: [id number] Arrival date & time: 09/11/20  0503     History Chief Complaint  Patient presents with   Chest Pain   Shortness of Breath    Jacqueline Hawkins is a 65 y.o. female.  HPI     This is a 65 year old female with a history of diabetes, hyperlipidemia who presents with left flank pain and chest pain.  Patient reports around 2 AM she had onset of left-sided flank pain that radiated around into the epigastrium and up into her chest.  She describes it as crampy.  She rates her pain 8 out of 10.  Nothing seems to make it better or worse including exertion or deep breaths.  She has had nausea without vomiting.  No urinary symptoms.  No similar symptoms in the past.  She is not taking anything for her pain.  Past Medical History:  Diagnosis Date   Asthma    Depression    Diabetes mellitus    no meds, AIC's normal since gastric bypass   Hyperlipidemia    OA (osteoarthritis)    Obesity    Venous insufficiency     Patient Active Problem List   Diagnosis Date Noted   Intussusception (HCC) 09/11/2020   OSA (obstructive sleep apnea) 11/10/2019   Diabetes mellitus type II, controlled (HCC)  09/23/2018   Major depressive disorder with single episode, in remission (HCC) 09/12/2017   History of bariatric surgery 03/28/2017   Routine general medical examination at a health care facility 06/19/2015   Iron deficiency anemia 06/19/2015   INCONTINENCE 07/31/2009   Asthma, mild 04/20/2008   Venous (peripheral) insufficiency 03/07/2008   Obesity, morbid, BMI 40.0-49.9 (HCC) 02/01/2008   Hyperlipidemia associated with type 2 diabetes mellitus (HCC) 12/29/2007   Depressive disorder 12/29/2007   OSTEOARTHRITIS 11/05/2006    Past Surgical History:  Procedure Laterality Date   GASTRIC BYPASS     TOTAL HIP ARTHROPLASTY Left      OB History   No obstetric history on file.     Family History  Problem Relation Age of  Onset   Diabetes Father    Heart disease Father    Thyroid disease Mother    Breast cancer Maternal Aunt     Social History   Tobacco Use   Smoking status: Former    Pack years: 0.00    Types: Cigarettes    Quit date: 03/26/1975    Years since quitting: 45.4   Smokeless tobacco: Never  Substance Use Topics   Alcohol use: Yes    Alcohol/week: 1.0 standard drink    Types: 1 Cans of beer per week    Comment: social   Drug use: No    Home Medications Prior to Admission medications   Medication Sig Start Date End Date Taking? Authorizing Provider  aspirin 81 MG tablet Take 1 tablet (81 mg total) by mouth daily. 03/15/11  Yes Roderick Pee, MD  atorvastatin (LIPITOR) 10 MG tablet TAKE 1 TABLET BY MOUTH EVERY DAY Patient taking differently: Take 10 mg by mouth daily. 11/15/19  Yes [place], Betty G, MD  Bacillus Coagulans-Inulin (PROBIOTIC-PREBIOTIC) 1-250 BILLION-MG CAPS Take 1 capsule by mouth daily.   Yes [provider]  buPROPion (WELLBUTRIN) 100 MG tablet Take 3 tablets by mouth daily. Patient taking differently: Take 300 mg by mouth daily. 04/07/20  Yes [place], Betty G, MD  Calcium Carb-Cholecalciferol (CALCIUM 1000 + D PO) Take 1 tablet by mouth daily.  Yes [provider]  cetirizine (ZYRTEC) 10 MG tablet Take 10 mg by mouth daily.   Yes [provider]  Cyanocobalamin 1000 MCG SUBL Place 1 tablet (1,000 mcg total) under the tongue daily. Patient taking differently: Place 1,000 mcg under the tongue daily. 12/29/12  Yes Roderick Peeodd, Jeffrey A, MD  escitalopram (LEXAPRO) 20 MG tablet Take 1 tablet (20 mg total) by mouth daily. 09/21/19  Yes [place], Betty G, MD  FeFum-FePoly-FA-B Cmp-C-Biot Henreitta Cea(FOLIVANE-PLUS) CAPS TAKE 1 CAPSULE BY MOUTH EVERY DAY Patient taking differently: Take 1 capsule by mouth 3 (three) times daily. 10/08/19  Yes [place], Betty G, MD  ibuprofen (ADVIL) 200 MG tablet Take 400 mg by mouth every 6 (six) hours as needed for mild pain.   Yes [provider]  Multiple Vitamins-Minerals ([employer] DAILY MULTIPLE PLUS MIN) TABS TAKE 1 TABLET BY MOUTH DAILY. Patient taking differently: Take 1 tablet by mouth daily. 10/27/11  Yes Roderick Peeodd, Jeffrey A, MD  Omega-3 Fatty Acids (OMEGA 3 PO) Take 1,200 mg by mouth daily.   Yes [provider]  vitamin C (ASCORBIC ACID) 500 MG tablet Take 500 mg by mouth daily.   Yes [provider]  albuterol (VENTOLIN HFA) 108 (90 Base) MCG/ACT inhaler Inhale 2 puffs into the lungs every 6 (six) hours as needed. Patient not taking: No sig reported 09/25/18   [place], Betty G, MD    Allergies    Patient has no known allergies.  Review of Systems   Review of Systems  Constitutional:  Negative for fever.  Respiratory:  Negative for shortness of breath.   Cardiovascular:  Positive for chest pain.  Gastrointestinal:  Positive for abdominal pain and nausea.  Genitourinary:  Positive for flank pain. Negative for dysuria and hematuria.  All other systems reviewed and are negative.  Physical Exam Updated Vital Signs BP 129/73 (BP Location: Left Arm)   Pulse 87   Temp 99.1 F (37.3 C) (Oral)   Resp 16   Ht 1.575 m (5\' 2" )   Wt 113.4 kg   LMP 06/11/2010   SpO2 97%   BMI 45.73 kg/m   Physical Exam Vitals and nursing note reviewed.  Constitutional:      Appearance: She is well-developed. She is obese. She is not ill-appearing.  HENT:     Head: Normocephalic and atraumatic.  Eyes:     Pupils: Pupils are equal, round, and reactive to light.  Cardiovascular:     Rate and Rhythm: Normal rate and regular rhythm.     Heart sounds: Normal heart sounds.  Pulmonary:     Effort: Pulmonary effort is normal. No respiratory distress.     Breath sounds: Normal breath sounds. No wheezing.  Chest:     Chest wall: No tenderness.  Abdominal:     General: Bowel sounds are normal.     Palpations: Abdomen is soft.     Comments: No CVA tenderness  Musculoskeletal:     Cervical back: Neck supple.  Skin:     General: Skin is warm and dry.     Findings: No rash.  Neurological:     Mental Status: She is alert and oriented to person, place, and time.  Psychiatric:        Mood and Affect: Mood normal.    ED Results / Procedures / Treatments   Labs (all labs ordered are listed, but only abnormal results are displayed) Labs Reviewed  COMPREHENSIVE METABOLIC PANEL - Abnormal; Notable for the following components:  Result Value   Glucose, Bld 170 (*)    All other components within normal limits  URINALYSIS, ROUTINE W REFLEX MICROSCOPIC - Abnormal; Notable for the following components:   APPearance HAZY (*)    Ketones, ur TRACE (*)    Nitrite POSITIVE (*)    Leukocytes,Ua LARGE (*)    WBC, UA >50 (*)    Bacteria, UA MANY (*)    All other components within normal limits  RESP PANEL BY RT-PCR (FLU A&B, COVID) ARPGX2  URINE CULTURE  CBC WITH DIFFERENTIAL/PLATELET  LIPASE, BLOOD  LACTIC ACID, PLASMA  HIV ANTIBODY (ROUTINE TESTING W REFLEX)  BASIC METABOLIC PANEL  CBC  TROPONIN I (HIGH SENSITIVITY)  TROPONIN I (HIGH SENSITIVITY)    EKG EKG Interpretation  Date/Time:  Monday September 11 2020 05:11:03 EDT Ventricular Rate:  87 PR Interval:  156 QRS Duration: 105 QT Interval:  369 QTC Calculation: 444 R Axis:   112 Text Interpretation: Sinus rhythm Right axis deviation Confirmed by Ross Marcus ([id number]) on 09/11/2020 6:14:47 AM  Radiology DG Chest 2 View  Result Date: 09/11/2020 CLINICAL DATA:  65 year old female with chest pain since 0200 hours. EXAM: CHEST - 2 VIEW COMPARISON:  Chest radiographs 04/11/2014 and earlier. FINDINGS: PA and lateral views. Mildly increased tortuosity of the thoracic aorta since 2016. Other mediastinal contours are within normal limits. Visualized tracheal air column is within normal limits. EKG leads and wires overlie the chest. No pneumothorax, pulmonary edema, pleural effusion or confluent pulmonary opacity identified. No acute osseous abnormality  identified. Negative visible bowel gas pattern. IMPRESSION: No acute cardiopulmonary abnormality. Electronically Signed   By: Odessa Fleming M.D.   On: 09/11/2020 06:13   CT Renal Stone Study  Result Date: 09/11/2020 CLINICAL DATA:  65 year old female with left flank and back pain since 0200 hours. Pain now radiating to the chest with shortness of breath. EXAM: CT ABDOMEN AND PELVIS WITHOUT CONTRAST TECHNIQUE: Multidetector CT imaging of the abdomen and pelvis was performed following the standard protocol without IV contrast. COMPARISON:  Chest radiographs 0539 hours. FINDINGS: Lower chest: Mildly tortuous descending thoracic aorta. No cardiomegaly or pericardial effusion. Negative lung bases. Hepatobiliary: Negative noncontrast liver. Small layering gallstones but no pericholecystic inflammation. Pancreas: Negative. Spleen: Small calcified splenic granulomas (benign), otherwise negative. Adrenals/Urinary Tract: Normal adrenal glands. Nonobstructed kidneys with no nephrolithiasis. 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 Decompressed ureters. Diminutive and unremarkable urinary bladder. Stomach/Bowel: Redundant but decompressed large bowel from the mid abdomen through to the pelvis. Redundant right colon with mild retained stool. Normal appendix on coronal image 62 coursing to the midline. Negative terminal ileum. Decompressed small bowel loops in the pelvis and right abdomen. Previous Roux-en-Y type gastric bypass with gastrojejunostomy. At the downstream small bowel anastomosis there is a prominent jejunal-jejunal intussusception with associated dilated intussuscipiens loop (coronal image 75). About 10 cm of small bowel is into susceptible. Upstream small bowel in the left abdomen and upper quadrant also mildly dilated. But the downstream small bowel is decompressed. No free air or free fluid. Minimal associated mesenteric inflammation. Vascular/Lymphatic: Normal caliber abdominal aorta.  Mild Aortoiliac calcified atherosclerosis. No lymphadenopathy. Reproductive: Negative noncontrast appearance. Other: No pelvic free fluid. Musculoskeletal: Grade 1 anterolisthesis of L4 on L5 with moderate to severe lower lumbar facet arthropathy. Lower thoracic endplate osteophytes. Left total hip arthroplasty. No acute osseous abnormality identified. IMPRESSION: 1. Positive for a 10 cm intussusception of small bowel located at the downstream small bowel anastomosis status post a Roux-en-Y type bypass.  Dilated intussuscipiens loop and mild associated upstream small bowel obstruction. No free air or free fluid. 2. No other acute or inflammatory process identified. Cholelithiasis without CT evidence of acute cholecystitis. No urologic calculus or obstructive uropathy. Electronically Signed   By: Odessa Fleming M.D.   On: 09/11/2020 07:05    Procedures Procedures   Medications Ordered in ED Medications  enoxaparin (LOVENOX) injection 40 mg (40 mg Subcutaneous Given 09/11/20 1806)  0.9 %  sodium chloride infusion ( Intravenous New Bag/Given 09/11/20 1806)  acetaminophen (TYLENOL) tablet 650 mg (has no administration in time range)    Or  acetaminophen (TYLENOL) suppository 650 mg (has no administration in time range)  oxyCODONE (Oxy IR/ROXICODONE) immediate release tablet 5-10 mg (has no administration in time range)  morphine 2 MG/ML injection 2 mg (2 mg Intravenous Given 09/11/20 1328)  diphenhydrAMINE (BENADRYL) 12.5 MG/5ML elixir 12.5 mg (has no administration in time range)    Or  diphenhydrAMINE (BENADRYL) injection 12.5 mg (has no administration in time range)  ondansetron (ZOFRAN-ODT) disintegrating tablet 4 mg ( Oral See Alternative 09/11/20 1324)    Or  ondansetron (ZOFRAN) injection 4 mg (4 mg Intravenous Given 09/11/20 1324)  pantoprazole (PROTONIX) injection 40 mg (40 mg Intravenous Given 09/11/20 2111)  hydrALAZINE (APRESOLINE) injection 10 mg (has no administration in time range)  cefTRIAXone  (ROCEPHIN) 1 g in sodium chloride 0.9 % 100 mL IVPB (has no administration in time range)  morphine 4 MG/ML injection 4 mg (4 mg Intravenous Given 09/11/20 0619)  ondansetron (ZOFRAN) injection 4 mg (4 mg Intravenous Given 09/11/20 0619)  cefTRIAXone (ROCEPHIN) 1 g in sodium chloride 0.9 % 100 mL IVPB (0 g Intravenous Stopped 09/11/20 4287)    ED Course  I have reviewed the triage vital signs and the nursing notes.  Pertinent labs & imaging results that were available during my care of the patient were reviewed by me and considered in my medical decision making (see chart for details).    MDM Rules/Calculators/A&P                          Patient presents with abdominal and flank pain that radiates into her chest.  It was abrupt in onset.  It is very atypical for ACS.  She is non-ill-appearing and vital signs are reassuring.  EKG shows no evidence of acute ischemia or arrhythmia.  Labs obtained including belly labs.  Considerations include but not limited to, kidney stone, intra-abdominal pathology, pancreatitis, SBO.  Patient was given pain and nausea medication.  Lab work obtained.  Fairly reassuring with no significant leukocytosis or metabolic derangements.  CT stone study obtained.  This was independently reviewed by myself.  Do not appreciate any kidney stones.  However, there does appear to be an abnormality in the mid intestine with some tunneling.  Question intussusception versus volvulus.  Radiology confirms concerns for intussusception.  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  Patient made NPO.  Surgery consulted.  Patient signed out to my partner to admit patient for surgery evaluation.  Of note, patient's urinalysis is nitrite positive greater than 50 white cells.  She is not having any urinary symptoms.  Urine culture was sent and she was given 1 dose of Rocephin. Final Clinical Impression(s) / ED Diagnoses Final diagnoses:  Intussusception [facility])    Rx / DC Orders ED Discharge Orders      None        Brilee Port, Mayer Masker, MD 09/11/20 2353

## 2020-09-11 NOTE — ED Triage Notes (Addendum)
Pt states at 0200 this morning she started to have left-sided back pain that has now radiated to the left side of her chest. Pt describes the pain as a "tightness" and is having a hard time "catching her breath". Pt also c/o nausea. Denies the pain radiating down her arm or up to her jaw. Patient states she has never had this type of pain before.   ASA 81 mg taken last night before bed.

## 2020-09-11 NOTE — ED Notes (Signed)
[facility] at bedside to transport pt to [facility] ED.

## 2020-09-11 NOTE — ED Provider Notes (Signed)
Pt transferred from [facility] with diagnosis of intussusception with plans for OR. Seen by Dr. Jacqulyn Bath earlier, please see their notes for full documentation of patient's complaint/HPI.   Physical Exam  BP 119/78   Pulse 88   Temp 98.5 F (36.9 C)   Resp (!) 21   Ht 5\' 2"  (1.575 m)   Wt 113.4 kg   LMP 06/11/2010   SpO2 97%   BMI 45.73 kg/m   Physical Exam Vitals and nursing note reviewed.  Constitutional:      Appearance: She is not ill-appearing.  HENT:     Head: Normocephalic and atraumatic.  Eyes:     Conjunctiva/sclera: Conjunctivae normal.  Cardiovascular:     Rate and Rhythm: Normal rate and regular rhythm.  Pulmonary:     Effort: Pulmonary effort is normal.     Breath sounds: Normal breath sounds.  Skin:    General: Skin is warm and dry.     Coloration: Skin is not jaundiced.  Neurological:     Mental Status: She is alert.    ED Course/Procedures     Procedures  Results for orders placed or performed during the hospital encounter of 09/11/20  Resp Panel by RT-PCR (Flu A&B, Covid) Nasopharyngeal Swab   Specimen: Nasopharyngeal Swab; Nasopharyngeal(NP) swabs in vial transport medium  Result Value Ref Range   SARS Coronavirus 2 by RT PCR NEGATIVE NEGATIVE   Influenza A by PCR NEGATIVE NEGATIVE   Influenza B by PCR NEGATIVE NEGATIVE  CBC with Differential  Result Value Ref Range   WBC 6.4 4.0 - 10.5 K/uL   RBC 4.36 3.87 - 5.11 MIL/uL   Hemoglobin 14.2 12.0 - 15.0 g/dL   HCT 09/13/20 14.4 - 81.8 %   MCV 98.4 80.0 - 100.0 fL   MCH 32.6 26.0 - 34.0 pg   MCHC 33.1 30.0 - 36.0 g/dL   RDW 56.3 14.9 - 70.2 %   Platelets 228 150 - 400 K/uL   nRBC 0.0 0.0 - 0.2 %   Neutrophils Relative % 67 %   Neutro Abs 4.3 1.7 - 7.7 K/uL   Lymphocytes Relative 24 %   Lymphs Abs 1.5 0.7 - 4.0 K/uL   Monocytes Relative 6 %   Monocytes Absolute 0.4 0.1 - 1.0 K/uL   Eosinophils Relative 2 %   Eosinophils Absolute 0.1 0.0 - 0.5 K/uL   Basophils Relative 1 %   Basophils Absolute  0.0 0.0 - 0.1 K/uL   Immature Granulocytes 0 %   Abs Immature Granulocytes 0.01 0.00 - 0.07 K/uL  Lipase, blood  Result Value Ref Range   Lipase 31 11 - 51 U/L  Comprehensive metabolic panel  Result Value Ref Range   Sodium 138 135 - 145 mmol/L   Potassium 4.1 3.5 - 5.1 mmol/L   Chloride 102 98 - 111 mmol/L   CO2 26 22 - 32 mmol/L   Glucose, Bld 170 (H) 70 - 99 mg/dL   BUN 14 8 - 23 mg/dL   Creatinine, Ser 63.7 0.44 - 1.00 mg/dL   Calcium 9.0 8.9 - 8.58 mg/dL   Total Protein 7.0 6.5 - 8.1 g/dL   Albumin 4.0 3.5 - 5.0 g/dL   AST 24 15 - 41 U/L   ALT 20 0 - 44 U/L   Alkaline Phosphatase 63 38 - 126 U/L   Total Bilirubin 0.5 0.3 - 1.2 mg/dL   GFR, Estimated 85.0 >27 mL/min   Anion gap 10 5 - 15  Urinalysis, Routine w reflex  microscopic Urine, Clean Catch  Result Value Ref Range   Color, Urine YELLOW YELLOW   APPearance HAZY (A) CLEAR   Specific Gravity, Urine 1.016 1.005 - 1.030   pH 5.5 5.0 - 8.0   Glucose, UA NEGATIVE NEGATIVE mg/dL   Hgb urine dipstick NEGATIVE NEGATIVE   Bilirubin Urine NEGATIVE NEGATIVE   Ketones, ur TRACE (A) NEGATIVE mg/dL   Protein, ur NEGATIVE NEGATIVE mg/dL   Nitrite POSITIVE (A) NEGATIVE   Leukocytes,Ua LARGE (A) NEGATIVE   RBC / HPF 0-5 0 - 5 RBC/hpf   WBC, UA >50 (H) 0 - 5 WBC/hpf   Bacteria, UA MANY (A) NONE SEEN   Squamous Epithelial / LPF 0-5 0 - 5   Hyaline Casts, UA PRESENT   Lactic acid, plasma  Result Value Ref Range   Lactic Acid, Venous 0.9 0.5 - 1.9 mmol/L  Troponin I (High Sensitivity)  Result Value Ref Range   Troponin I (High Sensitivity) 6 <18 ng/L  Troponin I (High Sensitivity)  Result Value Ref Range   Troponin I (High Sensitivity) 7 <18 ng/L   DG Chest 2 View  Result Date: 09/11/2020 CLINICAL DATA:  65 year old female with chest pain since 0200 hours. EXAM: CHEST - 2 VIEW COMPARISON:  Chest radiographs 04/11/2014 and earlier. FINDINGS: PA and lateral views. Mildly increased tortuosity of the thoracic aorta since 2016.  Other mediastinal contours are within normal limits. Visualized tracheal air column is within normal limits. EKG leads and wires overlie the chest. No pneumothorax, pulmonary edema, pleural effusion or confluent pulmonary opacity identified. No acute osseous abnormality identified. Negative visible bowel gas pattern. IMPRESSION: No acute cardiopulmonary abnormality. Electronically Signed   By: Odessa Fleming M.D.   On: 09/11/2020 06:13   CT Renal Stone Study  Result Date: 09/11/2020 CLINICAL DATA:  65 year old female with left flank and back pain since 0200 hours. Pain now radiating to the chest with shortness of breath. EXAM: CT ABDOMEN AND PELVIS WITHOUT CONTRAST TECHNIQUE: Multidetector CT imaging of the abdomen and pelvis was performed following the standard protocol without IV contrast. COMPARISON:  Chest radiographs 0539 hours. FINDINGS: Lower chest: Mildly tortuous descending thoracic aorta. No cardiomegaly or pericardial effusion. Negative lung bases. Hepatobiliary: Negative noncontrast liver. Small layering gallstones but no pericholecystic inflammation. Pancreas: Negative. Spleen: Small calcified splenic granulomas (benign), otherwise negative. Adrenals/Urinary Tract: Normal adrenal glands. Nonobstructed kidneys with no nephrolithiasis. 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 Decompressed ureters. Diminutive and unremarkable urinary bladder. Stomach/Bowel: Redundant but decompressed large bowel from the mid abdomen through to the pelvis. Redundant right colon with mild retained stool. Normal appendix on coronal image 62 coursing to the midline. Negative terminal ileum. Decompressed small bowel loops in the pelvis and right abdomen. Previous Roux-en-Y type gastric bypass with gastrojejunostomy. At the downstream small bowel anastomosis there is a prominent jejunal-jejunal intussusception with associated dilated intussuscipiens loop (coronal image 75). About 10 cm of small bowel  is into susceptible. Upstream small bowel in the left abdomen and upper quadrant also mildly dilated. But the downstream small bowel is decompressed. No free air or free fluid. Minimal associated mesenteric inflammation. Vascular/Lymphatic: Normal caliber abdominal aorta. Mild Aortoiliac calcified atherosclerosis. No lymphadenopathy. Reproductive: Negative noncontrast appearance. Other: No pelvic free fluid. Musculoskeletal: Grade 1 anterolisthesis of L4 on L5 with moderate to severe lower lumbar facet arthropathy. Lower thoracic endplate osteophytes. Left total hip arthroplasty. No acute osseous abnormality identified. IMPRESSION: 1. Positive for a 10 cm intussusception of small bowel located at the downstream  small bowel anastomosis status post a Roux-en-Y type bypass. Dilated intussuscipiens loop and mild associated upstream small bowel obstruction. No free air or free fluid. 2. No other acute or inflammatory process identified. Cholelithiasis without CT evidence of acute cholecystitis. No urologic calculus or obstructive uropathy. Electronically Signed   By: Odessa Fleming M.D.   On: 09/11/2020 07:05    MDM  Dr. Andrey Campanile aware pt is here in the ED and will come see patient.   Surgical team to admit patient.     Tanda Rockers, PA-C 09/11/20 1230    Jacalyn Lefevre, MD 09/11/20 1501

## 2020-09-12 ENCOUNTER — Observation Stay (HOSPITAL_COMMUNITY): Payer: 59

## 2020-09-12 LAB — BASIC METABOLIC PANEL
Anion gap: 6 (ref 5–15)
BUN: 8 mg/dL (ref 8–23)
CO2: 28 mmol/L (ref 22–32)
Calcium: 8.6 mg/dL — ABNORMAL LOW (ref 8.9–10.3)
Chloride: 108 mmol/L (ref 98–111)
Creatinine, Ser: 0.61 mg/dL (ref 0.44–1.00)
GFR, Estimated: >60 mL/min (ref >60)
Glucose, Bld: 124 mg/dL — ABNORMAL HIGH (ref 70–99)
Potassium: 3.9 mmol/L (ref 3.5–5.1)
Sodium: 142 mmol/L (ref 135–145)

## 2020-09-12 LAB — CBC
HCT: 38.8 % (ref 36.0–46.0)
Hemoglobin: 12.4 g/dL (ref 12.0–15.0)
MCH: 32.3 pg (ref 26.0–34.0)
MCHC: 32 g/dL (ref 30.0–36.0)
MCV: 101 fL — ABNORMAL HIGH (ref 80.0–100.0)
Platelets: 213 10*3/uL (ref 150–400)
RBC: 3.84 MIL/uL — ABNORMAL LOW (ref 3.87–5.11)
RDW: 14.7 % (ref 11.5–15.5)
WBC: 5.2 10*3/uL (ref 4.0–10.5)
nRBC: 0 % (ref 0.0–0.2)

## 2020-09-12 LAB — HIV ANTIBODY (ROUTINE TESTING W REFLEX): HIV Screen 4th Generation wRfx: NONREACTIVE

## 2020-09-12 MED ORDER — AMOXICILLIN 500 MG PO CAPS: 500.0000 mg | ORAL_CAPSULE | Freq: Three times a day (TID) | ORAL | 0 refills | Status: AC

## 2020-09-12 MED ORDER — IOHEXOL 9 MG/ML PO SOLN
ORAL | Status: AC
  Administered 2020-09-12: 500 mL
  Filled 2020-09-12: qty 1000

## 2020-09-12 MED ORDER — ACETAMINOPHEN 325 MG PO TABS
650.0000 mg | ORAL_TABLET | Freq: Four times a day (QID) | ORAL | Status: DC | PRN
Start: 2020-09-12 — End: 2021-12-07

## 2020-09-12 NOTE — Plan of Care (Signed)
Patient discharged home in stable condition 

## 2020-09-12 NOTE — Progress Notes (Signed)
Subjective: CC: Reports she was on the phone yesterday around 1/1:30 PM and developed a dry mouth followed by cough that caused her some abdominal discomfort.  She received pain medication with relief of her pain.  She reports since that time she has had no abdominal pain, nausea or vomiting. She has no abdominal pain currently.  She is passing flatus.  No BM since admission.  Objective: Vital signs in last 24 hours: Temp:  [97.7 F (36.5 C)-99.1 F (37.3 C)] 97.9 F (36.6 C) (06/21 0517) Pulse Rate:  [82-92] 89 (06/21 0517) Resp:  [15-18] 16 (06/21 0517) BP: (107-140)/(60-94) 107/60 (06/21 0517) SpO2:  [92 %-100 %] 96 % (06/21 0517) Last BM Date: 09/11/20  Intake/Output from previous day: 06/20 0701 - 06/21 0700 In: 1381.8 [I.V.:1281.6; IV Piggyback:100.2] Out: 0  Intake/Output this shift: No intake/output data recorded.  PE: Gen:  Alert, NAD, pleasant Card:  RRR Pulm:  CTAB, no W/R/R, effort normal Abd: Soft, NT/ND, +BS Psych: A&Ox3 Skin: no rashes noted, warm and dry   Lab Results:  Recent Labs    09/11/20 0515 09/12/20 0406  WBC 6.4 5.2  HGB 14.2 12.4  HCT 42.9 38.8  PLT 228 213   BMET Recent Labs    09/11/20 0526 09/12/20 0406  NA 138 142  K 4.1 3.9  CL 102 108  CO2 26 28  GLUCOSE 170* 124*  BUN 14 8  CREATININE 0.71 0.61  CALCIUM 9.0 8.6*   PT/INR No results for input(s): LABPROT, INR in the last 72 hours. CMP     Component Value Date/Time   NA 142 09/12/2020 0406   K 3.9 09/12/2020 0406   CL 108 09/12/2020 0406   CO2 28 09/12/2020 0406   GLUCOSE 124 (H) 09/12/2020 0406   BUN 8 09/12/2020 0406   CREATININE 0.61 09/12/2020 0406   CREATININE 0.68 10/27/2019 0802   CALCIUM 8.6 (L) 09/12/2020 0406   PROT 7.0 09/11/2020 0526   ALBUMIN 4.0 09/11/2020 0526   AST 24 09/11/2020 0526   ALT 20 09/11/2020 0526   ALKPHOS 63 09/11/2020 0526   BILITOT 0.5 09/11/2020 0526   GFRNONAA >60 09/12/2020 0406   GFRAA  08/08/2008 0930    >60         The eGFR has been calculated using the MDRD equation. This calculation has not been validated in all clinical situations. eGFR's persistently <60 mL/min signify possible Chronic Kidney Disease.   Lipase     Component Value Date/Time   LIPASE 31 09/11/2020 0526       Studies/Results: DG Chest 2 View  Result Date: 09/11/2020 CLINICAL DATA:  65 year old female with chest pain since 0200 hours. EXAM: CHEST - 2 VIEW COMPARISON:  Chest radiographs 04/11/2014 and earlier. FINDINGS: PA and lateral views. Mildly increased tortuosity of the thoracic aorta since 2016. Other mediastinal contours are within normal limits. Visualized tracheal air column is within normal limits. EKG leads and wires overlie the chest. No pneumothorax, pulmonary edema, pleural effusion or confluent pulmonary opacity identified. No acute osseous abnormality identified. Negative visible bowel gas pattern. IMPRESSION: No acute cardiopulmonary abnormality. Electronically Signed   By: Genevie Ann M.D.   On: 09/11/2020 06:13   CT Renal Stone Study  Result Date: 09/11/2020 CLINICAL DATA:  65 year old female with left flank and back pain since 0200 hours. Pain now radiating to the chest with shortness of breath. EXAM: CT ABDOMEN AND PELVIS WITHOUT CONTRAST TECHNIQUE: Multidetector CT imaging of the abdomen and  pelvis was performed following the standard protocol without IV contrast. COMPARISON:  Chest radiographs 0539 hours. FINDINGS: Lower chest: Mildly tortuous descending thoracic aorta. No cardiomegaly or pericardial effusion. Negative lung bases. Hepatobiliary: Negative noncontrast liver. Small layering gallstones but no pericholecystic inflammation. Pancreas: Negative. Spleen: Small calcified splenic granulomas (benign), otherwise negative. Adrenals/Urinary Tract: Normal adrenal glands. Nonobstructed kidneys with no nephrolithiasis. 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 Decompressed ureters.  Diminutive and unremarkable urinary bladder. Stomach/Bowel: Redundant but decompressed large bowel from the mid abdomen through to the pelvis. Redundant right colon with mild retained stool. Normal appendix on coronal image 62 coursing to the midline. Negative terminal ileum. Decompressed small bowel loops in the pelvis and right abdomen. Previous Roux-en-Y type gastric bypass with gastrojejunostomy. At the downstream small bowel anastomosis there is a prominent jejunal-jejunal intussusception with associated dilated intussuscipiens loop (coronal image 75). About 10 cm of small bowel is into susceptible. Upstream small bowel in the left abdomen and upper quadrant also mildly dilated. But the downstream small bowel is decompressed. No free air or free fluid. Minimal associated mesenteric inflammation. Vascular/Lymphatic: Normal caliber abdominal aorta. Mild Aortoiliac calcified atherosclerosis. No lymphadenopathy. Reproductive: Negative noncontrast appearance. Other: No pelvic free fluid. Musculoskeletal: Grade 1 anterolisthesis of L4 on L5 with moderate to severe lower lumbar facet arthropathy. Lower thoracic endplate osteophytes. Left total hip arthroplasty. No acute osseous abnormality identified. IMPRESSION: 1. Positive for a 10 cm intussusception of small bowel located at the downstream small bowel anastomosis status post a Roux-en-Y type bypass. Dilated intussuscipiens loop and mild associated upstream small bowel obstruction. No free air or free fluid. 2. No other acute or inflammatory process identified. Cholelithiasis without CT evidence of acute cholecystitis. No urologic calculus or obstructive uropathy. Electronically Signed   By: Genevie Ann M.D.   On: 09/11/2020 07:05    Anti-infectives: Anti-infectives (From admission, onward)    Start     Dose/Rate Route Frequency Ordered Stop   09/12/20 0800  cefTRIAXone (ROCEPHIN) 1 g in sodium chloride 0.9 % 100 mL IVPB        1 g 200 mL/hr over 30 Minutes  Intravenous Every 24 hours 09/11/20 1553     09/11/20 0715  cefTRIAXone (ROCEPHIN) 1 g in sodium chloride 0.9 % 100 mL IVPB        1 g 200 mL/hr over 30 Minutes Intravenous  Once 09/11/20 5701 09/11/20 7793        Assessment/Plan Intussusception in setting of prior gastric bypass - History of a gastric bypass by Dr. Hassell Done in 2012  - CT 6/20 w/ 10 cm intussusception of small bowel located at the downstream small bowel anastomosis  - Patients pain had resolved on our evaluation yesterday and remains resolved this AM. She is NT on exam. Lactic acid wnl. WBC wnl. Afebrile. No tachycardia or hypotension - Plan for repeat CT scan today with oral contrast to re-evaluate the area   FEN - NPO, IVF VTE - SCDs, Lovenox ID - Rocephin for possible UTI. Cx pending.  Foley - None   Possible UTI - UA suspicous for UTI. She was started on Rocephin. She denies any symptoms except that her urine smells foul. Ucx ordered HLD   LOS: 0 days    Jillyn Ledger , [facility] 09/12/2020, 9:40 AM Please see Amion for pager number during day hours 7:00am-4:30pm

## 2020-09-12 NOTE — Progress Notes (Signed)
I have reviewed and concur with students documentation.  

## 2020-09-13 LAB — URINE CULTURE: Culture: >=100000 — AB

## 2020-09-13 NOTE — Discharge Summary (Addendum)
[facility] Discharge Summary   Patient ID: Jacqueline Hawkins MRN: [id number] DOB/AGE: Apr 27, 1955 65 y.o.  Admit date: 09/11/2020 Discharge date: 09/12/2020  Discharge Diagnosis Patient Active Problem List   Diagnosis Date Noted   Intussusception (HCC) 09/11/2020   OSA (obstructive sleep apnea) 11/10/2019   Diabetes mellitus type II, controlled (HCC)  09/23/2018   Major depressive disorder with single episode, in remission (HCC) 09/12/2017   History of bariatric surgery 03/28/2017   Routine general medical examination at a health care facility 06/19/2015   Iron deficiency anemia 06/19/2015   INCONTINENCE 07/31/2009   Asthma, mild 04/20/2008   Venous (peripheral) insufficiency 03/07/2008   Obesity, morbid, BMI 40.0-49.9 (HCC) 02/01/2008   Hyperlipidemia associated with type 2 diabetes mellitus (HCC) 12/29/2007   Depressive disorder 12/29/2007   OSTEOARTHRITIS 11/05/2006    Consultants None   Imaging: CT ABDOMEN PELVIS WO CONTRAST  Result Date: 09/12/2020 CLINICAL DATA:  Abdominal distension, history of gastric bypass EXAM: CT ABDOMEN AND PELVIS WITHOUT CONTRAST TECHNIQUE: Multidetector CT imaging of the abdomen and pelvis was performed following the standard protocol without IV contrast. Oral enteric contrast was administered. COMPARISON:  CT abdomen pelvis, 09/11/2020 FINDINGS: Lower chest: No acute abnormality. Hepatobiliary: No solid liver abnormality is seen. Tiny gallstones and/or sludge in the dependent gallbladder (series 2, image 25). No gallbladder wall thickening, or biliary dilatation. Pancreas: Unremarkable. No pancreatic ductal dilatation or surrounding inflammatory changes. Spleen: Normal in size without significant abnormality. Adrenals/Urinary Tract: Adrenal glands are unremarkable. Kidneys are normal, without renal calculi, solid lesion, or hydronephrosis. Bladder is unremarkable. Stomach/Bowel: Redemonstrated postoperative findings of Roux-en-Y gastric  bypass. A previously identified small bowel small bowel intussusception in the vicinity of the Roux limb anastomosis is resolved. Appendix appears normal. No evidence of bowel wall thickening, distention, or inflammatory changes. Vascular/Lymphatic: Aortic atherosclerosis. No enlarged abdominal or pelvic lymph nodes. Reproductive: No mass or other significant abnormality. Other: No abdominal wall hernia or abnormality. Small volume free fluid in the low pelvis. Musculoskeletal: No acute or significant osseous findings. IMPRESSION: 1. Redemonstrated postoperative findings of Roux-en-Y gastric bypass. A previously identified small bowel intussusception in the vicinity of the Roux limb anastomosis is resolved. No evidence of bowel obstruction. 2. Small volume free fluid in the low pelvis, nonspecific. 3. Tiny gallstones and/or sludge in the dependent gallbladder. No evidence of acute cholecystitis. Aortic Atherosclerosis (ICD10-I70.0). Electronically Signed   By: Lauralyn Primes M.D.   On: 09/12/2020 12:54    Procedures None   HPI: Jacqueline Hawkins is a 65 y.o. female with a history of a gastric bypass by Dr. Daphine Deutscher in 2012 who presented to [facility] ED on 6/20 with abdominal and flank pain.   Patient reports she was awoken at 2 AM with sudden onset of left sided flank pain that radiated around to her epigastrium and into her chest with associated with nausea.  She tried drinking water on 2 occasion but this was followed by emesis.  Pain gradually worsened to a 9/10, prompting her to go to the ED.  She denies history of similar symptoms in the past.     PMHx: Asthma, HLD, previously diabetic but resolved after gastric bypass Past Surgical Hx: Gastric Bypass 2012 Etoh use: Daily 12oz beer Tobacco use: none Employment: Tourist information centre manager for [employer] Course:  She underwent CT without contrast that showed 10 cm intussusception of small bowel located at the downstream small bowel anastomosis.  No free air or  free fluid.  She has been afebrile  without tachycardia or hypotension since presentation.  WBC 6.4.  Lactic acid 0.9.  UA with likely UTI, cultures pending. She reports after receiving IV Zofran and morphine at 0615 her symptoms have completely resolved.  She currently is without any flank/abdominal pain or nausea.  She was transferred to [facility] ED for evaluation. She was admitted to general surgery for observation. On hospital day #1 she continued to deny abdominal pain, nausea or vomiting. Repeat CT scan demonstrated resolution of intussusception. She was started on a liquid diet which she tolerated. On 09/14/19 patient vitals were stable, pain resolved, tolerating PO, mobilizing, and felt stable for discharge home. She was discharged on a course of antibiotics for her UTI.    Allergies as of 09/12/2020   No Known Allergies      Medication List     STOP taking these medications    ibuprofen 200 MG tablet Commonly known as: ADVIL       TAKE these medications    acetaminophen 325 MG tablet Commonly known as: TYLENOL Take 2 tablets (650 mg total) by mouth every 6 (six) hours as needed for mild pain (or temp > 100).   amoxicillin 500 MG capsule Commonly known as: AMOXIL Take 1 capsule (500 mg total) by mouth 3 (three) times daily for 5 days.   aspirin 81 MG tablet Take 1 tablet (81 mg total) by mouth daily.   CALCIUM 1000 + D PO Take 1 tablet by mouth daily.   cetirizine 10 MG tablet Commonly known as: ZYRTEC Take 10 mg by mouth daily.   escitalopram 20 MG tablet Commonly known as: LEXAPRO Take 1 tablet (20 mg total) by mouth daily.   OMEGA 3 PO Take 1,200 mg by mouth daily.   Probiotic-Prebiotic 1-250 BILLION-MG Caps Take 1 capsule by mouth daily.   vitamin C 500 MG tablet Commonly known as: ASCORBIC ACID Take 500 mg by mouth daily.       ASK your doctor about these medications    albuterol 108 (90 Base) MCG/ACT inhaler Commonly known as: VENTOLIN HFA Inhale 2  puffs into the lungs every 6 (six) hours as needed.   atorvastatin 10 MG tablet Commonly known as: LIPITOR TAKE 1 TABLET BY MOUTH EVERY DAY   buPROPion 100 MG tablet Commonly known as: WELLBUTRIN Take 3 tablets by mouth daily.   [employer] Daily Multiple Plus Min Tabs TAKE 1 TABLET BY MOUTH DAILY.   Cyanocobalamin 1000 MCG Subl Place 1 tablet (1,000 mcg total) under the tongue daily.   Folivane-Plus Caps TAKE 1 CAPSULE BY MOUTH EVERY DAY        Signed: Hosie Spangle, [facility] 09/13/2020, 2:10 PM

## 2020-10-28 LAB — HM MAMMOGRAPHY

## 2020-11-01 ENCOUNTER — Encounter: Payer: Self-pay | Admitting: Family Medicine

## 2020-12-01 NOTE — Progress Notes (Signed)
HPI: Jacqueline Hawkins is a 65 y.o. female, who is here today for her routine physical and follow up on chronic medical problems.  Last CPE: 08/2017. She was last seen on 09/21/19.  Regular exercise 3 or more time per week: Walking daily for 25-30 min. Following a healthy diet: She is trying to do better since recent ED visit, Dx'ed with intussusception that resolved spontaneously. Eating plenty of vegetables,fruits. Small amount of red meat. Chicken and fish. She lives alone.  Chronic medical problems: DM II,HLD,OSA,iron def anemia,venous insufficiency,obesity s/p bariatric surgery,and depression.  Pap smear 10/21/2016 negative for HPV. Colonoscopy 06/2015. Diverticulosis in the left colon. - The examined portion of the ileum was normal. - The examination was otherwise normal. - No specimens collected. Overall no pathology on this exam to account for the patient's iron deficiency.  Mammogram 10/28/20, Bi-Rads 1.   Immunization History  Administered Date(s) Administered   Fluad Quad(high Dose 65+) 12/04/2020   Influenza Whole 12/29/2007, 12/20/2008   Influenza,inj,Quad PF,6+ Mos 12/29/2012, 02/10/2014, 12/08/2018, 12/09/2018, 01/29/2020   PFIZER Comirnaty(Gray Top)Covid-19 Tri-Sucrose Vaccine 10/31/2020   PFIZER(Purple Top)SARS-COV-2 Vaccination 06/09/2019, 07/10/2019, 02/09/2020   Pneumococcal Polysaccharide-23 03/26/2003, 01/02/2009, 12/04/2020   Td 03/26/2003   Tdap 02/10/2014   Mammogram: 10/28/20 Colonoscopy: 07/21/15 DEXA: 10/2017 osteoporosis, FRAX score 13% and 1.1 % for major osteoporotic fracture and hip fracture respectively.  Hep C screening: 08/08/16 NR.  Follow up and concerns today:  DM PO:EUMPNTIR after bariatric surgery. She is on non pharmacologic treatment. She is not checking BS's.  Lab Results  Component Value Date   HGBA1C 5.8 (H) 10/27/2019   Last eye exam 09/2020.  HLD:She is on Atorvastatin 10 mg daily. Tolerating statin well.  Lab Results   Component Value Date   CHOL 171 10/27/2019   HDL 65 10/27/2019   LDLCALC 89 10/27/2019   LDLDIRECT 189.9 12/22/2007   TRIG 76 10/27/2019   CHOLHDL 2.6 10/27/2019   Lab Results  Component Value Date   CREATININE 0.61 09/12/2020   BUN 8 09/12/2020   NA 142 09/12/2020   K 3.9 09/12/2020   CL 108 09/12/2020   CO2 28 09/12/2020   Lab Results  Component Value Date   WBC 5.2 09/12/2020   HGB 12.4 09/12/2020   HCT 38.8 09/12/2020   MCV 101.0 (H) 09/12/2020   PLT 213 09/12/2020   Depression: She is on Wellbutrin 100 mg 3 tabs daily and lexapro 20 mg daily. She has been on Lexapro for about 20 years. Denies depressed mood. She is not sure of she still needs medication.  Asthma:Has not had an exacerbation in years. She is on albuterol inh prn.  Left conjunctival redness noted Friday, no other associated symptoms. She has been evaluated for this problem and gradually improving.  Review of Systems  Constitutional:  Negative for appetite change, fatigue and fever.  HENT:  Negative for hearing loss, mouth sores, sore throat, trouble swallowing and voice change.   Eyes:  Positive for redness. Negative for photophobia, pain, discharge and visual disturbance.  Respiratory:  Negative for cough, shortness of breath and wheezing.   Cardiovascular:  Negative for chest pain and leg swelling.  Gastrointestinal:  Negative for abdominal pain, nausea and vomiting.       No changes in bowel habits.  Endocrine: Negative for cold intolerance, heat intolerance, polydipsia, polyphagia and polyuria.  Genitourinary:  Negative for decreased urine volume, dysuria, hematuria, vaginal bleeding and vaginal discharge.  Musculoskeletal:  Positive for arthralgias. Negative for gait problem  and myalgias.  Skin:  Negative for color change and rash.  Allergic/Immunologic: Positive for environmental allergies.  Neurological:  Negative for syncope, weakness and headaches.  Hematological:  Negative for  adenopathy. Does not bruise/bleed easily.  Psychiatric/Behavioral:  Negative for confusion. The patient is not nervous/anxious.   All other systems reviewed and are negative.  Current Outpatient Medications on File Prior to Visit  Medication Sig Dispense Refill   acetaminophen (TYLENOL) 325 MG tablet Take 2 tablets (650 mg total) by mouth every 6 (six) hours as needed for mild pain (or temp > 100).     albuterol (VENTOLIN HFA) 108 (90 Base) MCG/ACT inhaler Inhale 2 puffs into the lungs every 6 (six) hours as needed. 18 g 0   aspirin 81 MG tablet Take 1 tablet (81 mg total) by mouth daily. 90 tablet 3   atorvastatin (LIPITOR) 10 MG tablet TAKE 1 TABLET BY MOUTH EVERY DAY (Patient taking differently: Take 10 mg by mouth daily.) 90 tablet 3   Bacillus Coagulans-Inulin (PROBIOTIC-PREBIOTIC) 1-250 BILLION-MG CAPS Take 1 capsule by mouth daily.     buPROPion (WELLBUTRIN) 100 MG tablet Take 3 tablets by mouth daily. (Patient taking differently: Take 300 mg by mouth daily.) 270 tablet 1   Calcium Carb-Cholecalciferol (CALCIUM 1000 + D PO) Take 1 tablet by mouth daily.     cetirizine (ZYRTEC) 10 MG tablet Take 10 mg by mouth daily.     Cyanocobalamin 1000 MCG SUBL Place 1 tablet (1,000 mcg total) under the tongue daily. (Patient taking differently: Place 1,000 mcg under the tongue daily.) 90 tablet 3   escitalopram (LEXAPRO) 20 MG tablet Take 1 tablet (20 mg total) by mouth daily. 90 tablet 3   FeFum-FePoly-FA-B Cmp-C-Biot (FOLIVANE-PLUS) CAPS TAKE 1 CAPSULE BY MOUTH EVERY DAY (Patient taking differently: Take 1 capsule by mouth 3 (three) times daily.) 90 capsule 3   Multiple Vitamins-Minerals ([employer] DAILY MULTIPLE PLUS MIN) TABS TAKE 1 TABLET BY MOUTH DAILY. (Patient taking differently: Take 1 tablet by mouth daily.) 90 tablet 3   Omega-3 Fatty Acids (OMEGA 3 PO) Take 1,200 mg by mouth daily.     vitamin C (ASCORBIC ACID) 500 MG tablet Take 500 mg by mouth daily.     No current facility-administered  medications on file prior to visit.   Past Medical History:  Diagnosis Date   Asthma    Depression    Diabetes mellitus    no meds, AIC's normal since gastric bypass   Hyperlipidemia    OA (osteoarthritis)    Obesity    Venous insufficiency    Past Surgical History:  Procedure Laterality Date   GASTRIC BYPASS     TOTAL HIP ARTHROPLASTY Left     No Known Allergies  Family History  Problem Relation Age of Onset   Diabetes Father    Heart disease Father    Thyroid disease Mother    Breast cancer Maternal Aunt     Social History   Socioeconomic History   Marital status: Divorced    Spouse name: Not on file   Number of children: 2   Years of education: Not on file   Highest education level: Not on file  Occupational History   Occupation: customer relationship Production designer, theatre/television/film    Comment: [employer]  Tobacco Use   Smoking status: Former    Types: Cigarettes    Quit date: 03/26/1975    Years since quitting: 45.7   Smokeless tobacco: Never  Substance and Sexual Activity   Alcohol  use: Yes    Alcohol/week: 1.0 standard drink    Types: 1 Cans of beer per week    Comment: social   Drug use: No   Sexual activity: Never  Other Topics Concern   Not on file  Social History Narrative   Not on file   Social Determinants of Health   Financial Resource Strain: Not on file  Food Insecurity: Not on file  Transportation Needs: Not on file  Physical Activity: Not on file  Stress: Not on file  Social Connections: Not on file   Vitals:   12/04/20 0752  BP: 126/70  Pulse: 97  Resp: 16  SpO2: 97%   Body mass index is 44.51 kg/m.  Wt Readings from Last 3 Encounters:  12/04/20 243 lb 6 oz (110.4 kg)  09/11/20 250 lb (113.4 kg)  11/10/19 255 lb 6.4 oz (115.8 kg)   Physical Exam Vitals and nursing note reviewed.  Constitutional:      General: She is not in acute distress.    Appearance: She is well-developed.  HENT:     Head: Normocephalic and atraumatic.     Right  Ear: Hearing, tympanic membrane, ear canal and external ear normal.     Left Ear: Hearing, tympanic membrane, ear canal and external ear normal.     Mouth/Throat:     Mouth: Mucous membranes are moist.     Pharynx: Oropharynx is clear. Uvula midline.  Eyes:     Extraocular Movements: Extraocular movements intact.     Conjunctiva/sclera:     Right eye: No exudate or hemorrhage.    Left eye: Hemorrhage present.     Pupils: Pupils are equal, round, and reactive to light.  Neck:     Thyroid: No thyromegaly.     Trachea: No tracheal deviation.  Cardiovascular:     Rate and Rhythm: Normal rate and regular rhythm.     Pulses:          Dorsalis pedis pulses are 2+ on the right side and 2+ on the left side.     Heart sounds: No murmur heard. Pulmonary:     Effort: Pulmonary effort is normal. No respiratory distress.     Breath sounds: Normal breath sounds.  Abdominal:     Palpations: Abdomen is soft. There is no hepatomegaly or mass.     Tenderness: There is no abdominal tenderness.  Genitourinary:    Comments: No concerns. Musculoskeletal:     Comments: No major deformity or signs of synovitis appreciated.  Lymphadenopathy:     Cervical: No cervical adenopathy.     Upper Body:     Right upper body: No supraclavicular adenopathy.     Left upper body: No supraclavicular adenopathy.  Skin:    General: Skin is warm.     Findings: No erythema or rash.  Neurological:     General: No focal deficit present.     Mental Status: She is alert and oriented to person, place, and time.     Cranial Nerves: No cranial nerve deficit.     Coordination: Coordination normal.     Gait: Gait normal.     Deep Tendon Reflexes:     Reflex Scores:      Bicep reflexes are 2+ on the right side and 2+ on the left side.      Patellar reflexes are 2+ on the right side and 2+ on the left side. Psychiatric:        Speech: Speech normal.  Comments: Well groomed, good eye contact.   ASSESSMENT AND  PLAN:  Jacqueline Hawkins was here today annual physical examination.  Orders Placed This Encounter  Procedures   Flu Vaccine QUAD High Dose(Fluad)   Pneumococcal polysaccharide vaccine 23-valent greater than or equal to 2yo subcutaneous/IM   Hemoglobin A1c   Fructosamine   Microalbumin / creatinine urine ratio   Lipid panel   Lab Results  Component Value Date   CHOL 160 12/04/2020   HDL 59.90 12/04/2020   LDLCALC 90 12/04/2020   LDLDIRECT 189.9 12/22/2007   TRIG 52.0 12/04/2020   CHOLHDL 3 12/04/2020   Lab Results  Component Value Date   HGBA1C 6.0 12/04/2020   Lab Results  Component Value Date   MICROALBUR <0.7 12/04/2020   MICROALBUR 0.4 10/27/2019   Routine general medical examination at a health care facility We discussed the importance of regular physical activity and healthy diet for prevention of chronic illness and/or complications. Preventive guidelines reviewed. Vaccination updated. Ca++ and vit D supplementation to continue. Next CPE in a year.  Need for influenza vaccination -     Flu Vaccine QUAD High Dose(Fluad)  Need for pneumococcal vaccination -     Pneumococcal polysaccharide vaccine 23-valent greater than or equal to 2yo subcutaneous/IM  Obesity, morbid, BMI 40.0-49.9 (HCC) Wt has been stable since her last CPE. She understands the benefits of wt loss as well as adverse effects of obesity. Consistency with healthy diet and physical activity  encouraged.  Hyperlipidemia associated with type 2 diabetes mellitus (HCC) Continue Atorvastatin 10 mg daily and low fat diet. Further recommendations according to FLP result.  Major depressive disorder with single episode, in remission (HCC) Lexapro dose decreased, she will continue to alternate between 20 mg 10 mg for 3 weeks then 10 mg daily. No changes in Wellbutrin dose. F/U in 3-4 months.  Diabetes mellitus type II, controlled (HCC)  Problem has been well controlled since bariatric  surgery. Continue non pharmacologic treatment. Annual eye exam, periodic dental and foot care recommended. F/U in 5-6 months  Return in about 6 months (around 06/03/2021).  Makailyn Mccormick G. [place], MD  [facility]. [place] office.

## 2020-12-04 ENCOUNTER — Encounter: Payer: Self-pay | Admitting: Family Medicine

## 2020-12-04 ENCOUNTER — Ambulatory Visit (INDEPENDENT_AMBULATORY_CARE_PROVIDER_SITE_OTHER): Payer: 59 | Admitting: Family Medicine

## 2020-12-04 ENCOUNTER — Other Ambulatory Visit: Payer: Self-pay

## 2020-12-04 VITALS — BP 126/70 | HR 97 | Resp 16 | Ht 62.0 in | Wt 243.4 lb

## 2020-12-04 DIAGNOSIS — Z0000 Encounter for general adult medical examination without abnormal findings: Secondary | ICD-10-CM

## 2020-12-04 DIAGNOSIS — E1169 Type 2 diabetes mellitus with other specified complication: Secondary | ICD-10-CM

## 2020-12-04 DIAGNOSIS — F325 Major depressive disorder, single episode, in full remission: Secondary | ICD-10-CM | POA: Diagnosis not present

## 2020-12-04 DIAGNOSIS — E785 Hyperlipidemia, unspecified: Secondary | ICD-10-CM

## 2020-12-04 DIAGNOSIS — Z23 Encounter for immunization: Secondary | ICD-10-CM

## 2020-12-04 LAB — MICROALBUMIN / CREATININE URINE RATIO
Creatinine,U: 65 mg/dL
Microalb Creat Ratio: 1.1 mg/g (ref 0.0–30.0)
Microalb, Ur: <0.7 mg/dL (ref 0.0–1.9)

## 2020-12-04 LAB — LIPID PANEL
Cholesterol: 160 mg/dL (ref 0–200)
HDL: 59.9 mg/dL (ref >39.00)
LDL Cholesterol: 90 mg/dL (ref 0–99)
NonHDL: 100.57
Total CHOL/HDL Ratio: 3
Triglycerides: 52 mg/dL (ref 0.0–149.0)
VLDL: 10.4 mg/dL (ref 0.0–40.0)

## 2020-12-04 LAB — HEMOGLOBIN A1C: Hgb A1c MFr Bld: 6 % (ref 4.6–6.5)

## 2020-12-04 NOTE — Patient Instructions (Addendum)
Today you have you routine preventive visit. A few things to remember from today's visit:   Routine general medical examination at a health care facility  Hyperlipidemia associated with type 2 diabetes mellitus (HCC) - Plan: Lipid panel  Major depressive disorder with single episode, in remission (HCC)  Controlled type 2 diabetes mellitus with other specified complication, without long-term current use of insulin (HCC) - Plan: Hemoglobin A1c, Fructosamine, Microalbumin / creatinine urine ratio  If you need refills please call your pharmacy. Today we decreased dose of Lexapro, alternate between 20 mg 10 mg for 3 weeks then 10 mg daily. In 2 months you can decrease Wellbutrin from 3 tabs to 2 tabs. Do not use My Chart to request refills or for acute issues that need immediate attention.    Please be sure medication list is accurate. If a new problem present, please set up appointment sooner than planned today.  At least 150 minutes of moderate exercise per week, daily brisk walking for 15-30 min is a good exercise option. Healthy diet low in saturated (animal) fats and sweets and consisting of fresh fruits and vegetables, lean meats such as fish and white chicken and whole grains.  These are some of recommendations for screening depending of age and risk factors:  - Vaccines:  Tdap vaccine every 10 years.  Shingles vaccine recommended at age 49, could be given after 65 years of age but not sure about insurance coverage.   Pneumonia vaccines: Pneumovax at 65. Sometimes Pneumovax is giving earlier if history of smoking, lung disease,diabetes,kidney disease among some.  Cervical cancer prevention:  Pap smear starts at 65 years of age and continues periodically until 65 years old in low risk women. Pap smear every 3 years between 63 and 26 years old. Pap smear every 3-5 years between women 30 and older if pap smear negative and HPV screening negative.   -Breast cancer: Mammogram:  There is disagreement between experts about when to start screening in low risk asymptomatic female but recent recommendations are to start screening at 61 and not later than 65 years old , every 1-2 years and after 65 yo q 2 years. Screening is recommended until 65 years old but some women can continue screening depending of healthy issues.  Colon cancer screening: Has been recently changed to 65 yo. Insurance may not cover until you are 65 years old. Screening is recommended until 65 years old.  Cholesterol disorder screening at age 39 and every 3 years.N/A  Also recommended:  Dental visit- Brush and floss your teeth twice daily; visit your dentist twice a year. Eye doctor- Get an eye exam at least every 2 years. Helmet use- Always wear a helmet when riding a bicycle, motorcycle, rollerblading or skateboarding. Safe sex- If you may be exposed to sexually transmitted infections, use a condom. Seat belts- Seat belts can save your live; always wear one. Smoke/Carbon Monoxide detectors- These detectors need to be installed on the appropriate level of your home. Replace batteries at least once a year. Skin cancer- When out in the sun please cover up and use sunscreen 15 SPF or higher. Violence- If anyone is threatening or hurting you, please tell your healthcare provider.  Drink alcohol in moderation- Limit alcohol intake to one drink or less per day. Never drink and drive. Calcium supplementation 1000 to 1200 mg daily, ideally through your diet.  Vitamin D supplementation 800 units daily.

## 2020-12-04 NOTE — Assessment & Plan Note (Signed)
Problem has been well controlled since bariatric surgery. Continue non pharmacologic treatment. Annual eye exam, periodic dental and foot care recommended. F/U in 5-6 months

## 2020-12-04 NOTE — Assessment & Plan Note (Signed)
Continue Atorvastatin 10 mg daily and low fat diet. Further recommendations according to FLP result. 

## 2020-12-04 NOTE — Assessment & Plan Note (Signed)
Lexapro dose decreased, she will continue to alternate between 20 mg 10 mg for 3 weeks then 10 mg daily. No changes in Wellbutrin dose. F/U in 3-4 months.

## 2020-12-07 LAB — FRUCTOSAMINE: Fructosamine: 245 umol/L (ref 205–285)

## 2020-12-07 MED ORDER — ATORVASTATIN CALCIUM 10 MG PO TABS: 10.0000 mg | ORAL_TABLET | Freq: Every day | ORAL | 3 refills | Status: DC

## 2020-12-07 MED ORDER — BUPROPION HCL 100 MG PO TABS: 300.0000 mg | ORAL_TABLET | Freq: Every day | ORAL | 1 refills | Status: DC

## 2020-12-08 ENCOUNTER — Other Ambulatory Visit: Payer: Self-pay | Admitting: Family Medicine

## 2020-12-08 DIAGNOSIS — D509 Iron deficiency anemia, unspecified: Secondary | ICD-10-CM

## 2020-12-11 ENCOUNTER — Other Ambulatory Visit: Payer: Self-pay | Admitting: Family Medicine

## 2020-12-11 DIAGNOSIS — F325 Major depressive disorder, single episode, in full remission: Secondary | ICD-10-CM

## 2020-12-26 ENCOUNTER — Encounter: Payer: 59 | Admitting: Family Medicine

## 2021-03-05 ENCOUNTER — Encounter: Payer: Self-pay | Admitting: Family Medicine

## 2021-03-05 DIAGNOSIS — R059 Cough, unspecified: Secondary | ICD-10-CM

## 2021-03-05 MED ORDER — ALBUTEROL SULFATE HFA 108 (90 BASE) MCG/ACT IN AERS
2.0000 | INHALATION_SPRAY | Freq: Four times a day (QID) | RESPIRATORY_TRACT | 0 refills | Status: DC | PRN
Start: 2021-03-05 — End: 2021-03-07

## 2021-03-07 MED ORDER — LEVALBUTEROL TARTRATE 45 MCG/ACT IN AERO: 2.0000 | INHALATION_SPRAY | Freq: Four times a day (QID) | RESPIRATORY_TRACT | 3 refills | Status: DC | PRN

## 2021-03-07 NOTE — Addendum Note (Signed)
Addended by: Kathreen Devoid on: 03/07/2021 08:39 AM   Modules accepted: Orders

## 2021-04-16 DIAGNOSIS — G4733 Obstructive sleep apnea (adult) (pediatric): Secondary | ICD-10-CM | POA: Diagnosis not present

## 2021-05-30 DIAGNOSIS — H906 Mixed conductive and sensorineural hearing loss, bilateral: Secondary | ICD-10-CM | POA: Diagnosis not present

## 2021-07-17 ENCOUNTER — Other Ambulatory Visit: Payer: Self-pay | Admitting: Family Medicine

## 2021-07-17 DIAGNOSIS — F325 Major depressive disorder, single episode, in full remission: Secondary | ICD-10-CM

## 2021-07-24 DIAGNOSIS — G4733 Obstructive sleep apnea (adult) (pediatric): Secondary | ICD-10-CM | POA: Diagnosis not present

## 2021-10-22 DIAGNOSIS — G4733 Obstructive sleep apnea (adult) (pediatric): Secondary | ICD-10-CM | POA: Diagnosis not present

## 2021-11-15 DIAGNOSIS — Z1231 Encounter for screening mammogram for malignant neoplasm of breast: Secondary | ICD-10-CM | POA: Diagnosis not present

## 2021-11-15 LAB — HM MAMMOGRAPHY

## 2021-11-16 ENCOUNTER — Encounter: Payer: Self-pay | Admitting: Family Medicine

## 2021-12-05 NOTE — Progress Notes (Unsigned)
HPI: [name] is a 66 y.o. female, who is here today for her routine physical and annual follow up.  Last CPE: 12/04/20 No new problems since her last vitis.  She walks daily, 4000-5000 steps. She has not been consistent with following a healthful diet due to work schedule. Planning on prep meals/meals planning. Sleeps about 6-8 hours.  Chronic medical problems: DM II,HLD,OSA,OA,depression,and s/p bariatric surgery among some.  Immunization History  Administered Date(s) Administered   Fluad Quad(high Dose 65+) 12/04/2020   Influenza Whole 12/29/2007, 12/20/2008   Influenza,inj,Quad PF,6+ Mos 12/29/2012, 02/10/2014, 12/08/2018, 12/09/2018, 01/29/2020   PFIZER Comirnaty(Gray Top)Covid-19 Tri-Sucrose Vaccine 10/31/2020   PFIZER(Purple Top)SARS-COV-2 Vaccination 06/09/2019, 07/10/2019, 02/09/2020   Pneumococcal Polysaccharide-23 03/26/2003, 01/02/2009, 12/04/2020   Td 03/26/2003   Tdap 02/10/2014   Zoster Recombinat (Shingrix) 12/07/2021   Health Maintenance  Topic Date Due   OPHTHALMOLOGY EXAM  08/06/2020   INFLUENZA VACCINE  10/23/2021   Pneumonia Vaccine 69+ Years old (2 - PCV) 12/04/2021   COVID-19 Vaccine (5 - Pfizer series) 12/23/2021 (Originally 12/26/2020)   Zoster Vaccines- Shingrix (2 of 2) 02/01/2022   HEMOGLOBIN A1C  06/07/2022   Diabetic kidney evaluation - GFR measurement  12/08/2022   Diabetic kidney evaluation - Urine ACR  12/08/2022   FOOT EXAM  12/08/2022   MAMMOGRAM  11/16/2023   TETANUS/TDAP  02/11/2024   COLONOSCOPY (Pts 45-71yrs Insurance coverage will need to be confirmed)  07/20/2025   DEXA SCAN  Completed   Hepatitis C Screening  Completed   HPV VACCINES  Aged Out   Pap smear in 09/2016:  Adequacy Satisfactory for evaluation  endocervical/transformation zone component PRESENT.   Diagnosis NEGATIVE FOR INTRAEPITHELIAL LESIONS OR MALIGNANCY.   HPV NOT DETECTED   Comment: Normal Reference Range - NOT Detected  Material Submitted  CervicoVaginal Pap [ThinPrep Imaged]    DM II: She is on non pharmacologic  Negative for polydipsia,polyuria, or polyphagia.  Lab Results  Component Value Date   HGBA1C 6.0 12/04/2020   Last eye exam 10/2021.  HLD on Atorvastatin 10 mg daily. Tolerating medication well. Lab Results  Component Value Date   CHOL 160 12/04/2020   HDL 59.90 12/04/2020   LDLCALC 90 12/04/2020   LDLDIRECT 189.9 12/22/2007   TRIG 52.0 12/04/2020   CHOLHDL 3 12/04/2020   Depression:  For many years. She is on Lexapro 20 mg daily and Wellbutrin 100 mg 3 tabs daily, both medications she has taken for many years. She still feels like the medication is helping with depression, tried to decrease dose and symptoms reoccurred. She has no noted side effects.     12/07/2021    7:31 AM 12/04/2020    9:51 PM 09/21/2019    7:24 AM 09/12/2017    9:49 AM  Depression screen PHQ 2/9  Decreased Interest 0 0 1 0  Down, Depressed, Hopeless 0 0 0 0  PHQ - 2 Score 0 0 1 0  Altered sleeping 0 0 0   Tired, decreased energy 1 0 0   Change in appetite 1 0 0   Feeling bad or failure about yourself  1 0 0   Trouble concentrating 0 0 0   Moving slowly or fidgety/restless 0 0 0   Suicidal thoughts 0 0 0   PHQ-9 Score 3 0 1   Difficult doing work/chores Not difficult at all Not difficult at all     OSA: Wears her CPAP every night.  She thinks she may have a UTI. 5-7 days  of urgency and frequency with small amounts of urine. Negative for dysuria, gross hematuria,or decreased urine output.  Asthma: Requesting refills for Xopenex, which she uses for wheezing.She does not need medication very frequent. She has followed-up with pulmonologist for OSA, last visit in 10/2019.  Review of Systems  Constitutional:  Negative for activity change, appetite change, chills and fever.  HENT:  Negative for hearing loss, mouth sores and sore throat.   Eyes:  Negative for redness and visual disturbance.  Respiratory:  Negative for  cough, shortness of breath and wheezing.   Cardiovascular:  Negative for chest pain and leg swelling.  Gastrointestinal:  Negative for abdominal pain, nausea and vomiting.       No changes in bowel habits.  Endocrine: Negative for cold intolerance, heat intolerance, polydipsia, polyphagia and polyuria.  Genitourinary:  Negative for flank pain, vaginal bleeding and vaginal discharge.  Musculoskeletal:  Positive for arthralgias. Negative for gait problem and myalgias.  Skin:  Negative for color change and rash.  Allergic/Immunologic: Negative for environmental allergies.  Neurological:  Negative for syncope, weakness and headaches.  Hematological:  Negative for adenopathy. Does not bruise/bleed easily.  Psychiatric/Behavioral:  Negative for confusion and sleep disturbance. The patient is not nervous/anxious.   All other systems reviewed and are negative.  Current Outpatient Medications on File Prior to Visit  Medication Sig Dispense Refill   aspirin 81 MG tablet Take 1 tablet (81 mg total) by mouth daily. 90 tablet 3   atorvastatin (LIPITOR) 10 MG tablet Take 1 tablet (10 mg total) by mouth daily. 90 tablet 3   buPROPion (WELLBUTRIN) 100 MG tablet TAKE 3 TABLETS BY MOUTH DAILY 270 tablet 1   cetirizine (ZYRTEC) 10 MG tablet Take 10 mg by mouth daily.     Cyanocobalamin 1000 MCG SUBL Place 1 tablet (1,000 mcg total) under the tongue daily. (Patient taking differently: Place 1,000 mcg under the tongue daily.) 90 tablet 3   Multiple Vitamins-Minerals ([facility] DAILY MULTIPLE PLUS MIN) TABS TAKE 1 TABLET BY MOUTH DAILY. (Patient taking differently: Take 1 tablet by mouth daily.) 90 tablet 3   vitamin C (ASCORBIC ACID) 500 MG tablet Take 500 mg by mouth daily.     FeFum-FePoly-FA-B Cmp-C-Biot (FOLIVANE-PLUS) CAPS TAKE 1 CAPSULE BY MOUTH EVERY DAY 90 capsule 3   No current facility-administered medications on file prior to visit.   Past Medical History:  Diagnosis Date   Asthma    Depression     Diabetes mellitus    no meds, AIC's normal since gastric bypass   Hyperlipidemia    OA (osteoarthritis)    Obesity    Venous insufficiency    Past Surgical History:  Procedure Laterality Date   GASTRIC BYPASS     TOTAL HIP ARTHROPLASTY Left    No Known Allergies  Family History  Problem Relation Age of Onset   Diabetes Father    Heart disease Father    Thyroid disease Mother    Breast cancer Maternal Aunt    Social History   Socioeconomic History   Marital status: Divorced    Spouse name: Not on file   Number of children: 2   Years of education: Not on file   Highest education level: Not on file  Occupational History   Occupation: customer relationship Production designer, theatre/television/film    Comment: [employer]  Tobacco Use   Smoking status: Former    Types: Cigarettes    Quit date: 03/26/1975    Years since quitting: 46.7   Smokeless  tobacco: Never  Substance and Sexual Activity   Alcohol use: Yes    Alcohol/week: 1.0 standard drink of alcohol    Types: 1 Cans of beer per week    Comment: social   Drug use: No   Sexual activity: Never  Other Topics Concern   Not on file  Social History Narrative   Not on file   Social Determinants of Health   Financial Resource Strain: Not on file  Food Insecurity: Not on file  Transportation Needs: Not on file  Physical Activity: Not on file  Stress: Not on file  Social Connections: Not on file   Vitals:   12/07/21 0725  BP: 110/78  Pulse: 97  Resp: 16  SpO2: 96%  Body mass index is 45.27 kg/m. Wt Readings from Last 3 Encounters:  12/07/21 247 lb 8 oz (112.3 kg)  12/04/20 243 lb 6 oz (110.4 kg)  09/11/20 250 lb (113.4 kg)  Physical Exam Vitals and nursing note reviewed.  Constitutional:      General: She is not in acute distress.    Appearance: She is well-developed and well-groomed.  HENT:     Head: Normocephalic and atraumatic.     Right Ear: Hearing, tympanic membrane, ear canal and external ear normal.     Left Ear: Hearing,  tympanic membrane, ear canal and external ear normal.     Mouth/Throat:     Mouth: Mucous membranes are moist.     Pharynx: Oropharynx is clear. Uvula midline.  Eyes:     Extraocular Movements: Extraocular movements intact.     Conjunctiva/sclera: Conjunctivae normal.     Pupils: Pupils are equal, round, and reactive to light.  Neck:     Thyroid: No thyromegaly.     Trachea: No tracheal deviation.  Cardiovascular:     Rate and Rhythm: Normal rate and regular rhythm.     Pulses:          Dorsalis pedis pulses are 2+ on the right side and 2+ on the left side.     Heart sounds: No murmur heard.    Comments: Trace pitting edema LE, bilateral. Pulmonary:     Effort: Pulmonary effort is normal. No respiratory distress.     Breath sounds: Normal breath sounds.  Abdominal:     Palpations: Abdomen is soft. There is no hepatomegaly or mass.     Tenderness: There is no abdominal tenderness.  Musculoskeletal:     Comments: No signs of synovitis appreciated.  Lymphadenopathy:     Cervical: No cervical adenopathy.     Upper Body:     Right upper body: No supraclavicular adenopathy.     Left upper body: No supraclavicular adenopathy.  Skin:    General: Skin is warm.     Findings: No erythema or rash.  Neurological:     General: No focal deficit present.     Mental Status: She is alert and oriented to person, place, and time.     Cranial Nerves: No cranial nerve deficit.     Coordination: Coordination normal.     Gait: Gait normal.     Deep Tendon Reflexes:     Reflex Scores:      Bicep reflexes are 2+ on the right side and 2+ on the left side.      Patellar reflexes are 2+ on the right side and 2+ on the left side. Psychiatric:        Mood and Affect: Mood and affect normal.   ASSESSMENT  AND PLAN:  Ms. IZADORA ROEHR was here today annual physical examination.  Orders Placed This Encounter  Procedures   Varicella-zoster vaccine IM   Microalbumin / creatinine urine ratio    Urinalysis with Culture Reflex   Comprehensive metabolic panel   Hemoglobin A1c   Lipid panel   Vitamin B12   VITAMIN D 25 Hydroxy (Vit-D Deficiency, Fractures)   CBC   Lab Results  Component Value Date   WBC 3.7 (L) 12/07/2021   HGB 13.8 12/07/2021   HCT 40.6 12/07/2021   MCV 96.2 12/07/2021   PLT 207.0 12/07/2021   Lab Results  Component Value Date   MICROALBUR <0.7 12/07/2021   MICROALBUR <0.7 12/04/2020   Lab Results  Component Value Date   VITAMINB12 1,128 (H) 12/07/2021   Lab Results  Component Value Date   ALT 24 12/07/2021   AST 24 12/07/2021   ALKPHOS 72 12/07/2021   BILITOT 0.5 12/07/2021   Lab Results  Component Value Date   CREATININE 0.69 12/07/2021   BUN 13 12/07/2021   NA 139 12/07/2021   K 4.1 12/07/2021   CL 103 12/07/2021   CO2 30 12/07/2021   Lab Results  Component Value Date   HGBA1C 6.1 12/07/2021   Lab Results  Component Value Date   CHOL 157 12/07/2021   HDL 55.90 12/07/2021   LDLCALC 87 12/07/2021   LDLDIRECT 189.9 12/22/2007   TRIG 69.0 12/07/2021   CHOLHDL 3 12/07/2021   Hyperlipidemia associated with type 2 diabetes mellitus (HCC) Continue Atorvastatin 10 mg daily and low fat diet. Further recommendations according to lab FLP result.  Depression, major, recurrent, in partial remission (HCC) Symptoms are well controlled, tried to decrease dose of meds and symptoms reoccured, so no changes in Bupropion and Citalopram dose. As far as symptoms are stable, annual follow up is appropriate.  Routine general medical examination at a health care facility We discussed the importance of regular physical activity and healthy diet for prevention of chronic illness and/or complications. Preventive guidelines reviewed. She is due for pap smear, we did not do it today. Will come back in 12/2021 for her gyn examination. Vaccination: Shingrix given today. Ca++ and vit D supplementation to continue. Next CPE in a year.  Diabetes mellitus  type II, controlled (HCC)  HgA1C has been at goal. Continue non pharmacologic treatment. Annual eye exam, periodic dental and foot care recommended. She would like annual follow ups.  History of bariatric surgery Continue vit supplementation. Today CBC, B12,and 25 OH vit D will be obtained with abs.  Asthma, mild Problem is well controlled. Continue Xopenex inhaler 1 to 2 puff every 6 hours as needed for wheezing.  Obesity, morbid, BMI 40.0-49.9 (HCC) Gained about 4 Lb since her last visit. She understands the benefits of wt loss as well as adverse effects of obesity. Consistency with healthy diet and physical activity encouraged. Making positive life style changes, small steps at the time.  Urine frequency Possible causes discussed. No associated dysuria. Further recommendations according to UA and Ucx. Adequate hydration.  Need for shingles vaccine -     Varicella-zoster vaccine IM  Return in 1 year (on 12/08/2022).  Alleen Kehm G. [place], MD  [facility]. [place] office.

## 2021-12-06 ENCOUNTER — Other Ambulatory Visit: Payer: Self-pay | Admitting: Family Medicine

## 2021-12-06 DIAGNOSIS — D509 Iron deficiency anemia, unspecified: Secondary | ICD-10-CM

## 2021-12-07 ENCOUNTER — Encounter: Payer: Self-pay | Admitting: Family Medicine

## 2021-12-07 ENCOUNTER — Ambulatory Visit (INDEPENDENT_AMBULATORY_CARE_PROVIDER_SITE_OTHER): Payer: BC Managed Care – PPO | Admitting: Family Medicine

## 2021-12-07 ENCOUNTER — Other Ambulatory Visit: Payer: Self-pay

## 2021-12-07 VITALS — BP 110/78 | HR 97 | Resp 16 | Ht 62.0 in | Wt 247.5 lb

## 2021-12-07 DIAGNOSIS — E785 Hyperlipidemia, unspecified: Secondary | ICD-10-CM | POA: Diagnosis not present

## 2021-12-07 DIAGNOSIS — R350 Frequency of micturition: Secondary | ICD-10-CM | POA: Diagnosis not present

## 2021-12-07 DIAGNOSIS — F325 Major depressive disorder, single episode, in full remission: Secondary | ICD-10-CM

## 2021-12-07 DIAGNOSIS — J4520 Mild intermittent asthma, uncomplicated: Secondary | ICD-10-CM

## 2021-12-07 DIAGNOSIS — Z0000 Encounter for general adult medical examination without abnormal findings: Secondary | ICD-10-CM | POA: Diagnosis not present

## 2021-12-07 DIAGNOSIS — Z23 Encounter for immunization: Secondary | ICD-10-CM | POA: Diagnosis not present

## 2021-12-07 DIAGNOSIS — F3341 Major depressive disorder, recurrent, in partial remission: Secondary | ICD-10-CM | POA: Diagnosis not present

## 2021-12-07 DIAGNOSIS — Z9884 Bariatric surgery status: Secondary | ICD-10-CM

## 2021-12-07 DIAGNOSIS — E1169 Type 2 diabetes mellitus with other specified complication: Secondary | ICD-10-CM

## 2021-12-07 LAB — COMPREHENSIVE METABOLIC PANEL
ALT: 24 U/L (ref 0–35)
AST: 24 U/L (ref 0–37)
Albumin: 3.7 g/dL (ref 3.5–5.2)
Alkaline Phosphatase: 72 U/L (ref 39–117)
BUN: 13 mg/dL (ref 6–23)
CO2: 30 mEq/L (ref 19–32)
Calcium: 9.1 mg/dL (ref 8.4–10.5)
Chloride: 103 mEq/L (ref 96–112)
Creatinine, Ser: 0.69 mg/dL (ref 0.40–1.20)
GFR: 90.67 mL/min (ref >60.00)
Glucose, Bld: 111 mg/dL — ABNORMAL HIGH (ref 70–99)
Potassium: 4.1 mEq/L (ref 3.5–5.1)
Sodium: 139 mEq/L (ref 135–145)
Total Bilirubin: 0.5 mg/dL (ref 0.2–1.2)
Total Protein: 6.8 g/dL (ref 6.0–8.3)

## 2021-12-07 LAB — CBC
HCT: 40.6 % (ref 36.0–46.0)
Hemoglobin: 13.8 g/dL (ref 12.0–15.0)
MCHC: 33.9 g/dL (ref 30.0–36.0)
MCV: 96.2 fl (ref 78.0–100.0)
Platelets: 207 10*3/uL (ref 150.0–400.0)
RBC: 4.22 Mil/uL (ref 3.87–5.11)
RDW: 14.2 % (ref 11.5–15.5)
WBC: 3.7 10*3/uL — ABNORMAL LOW (ref 4.0–10.5)

## 2021-12-07 LAB — LIPID PANEL
Cholesterol: 157 mg/dL (ref 0–200)
HDL: 55.9 mg/dL (ref >39.00)
LDL Cholesterol: 87 mg/dL (ref 0–99)
NonHDL: 100.64
Total CHOL/HDL Ratio: 3
Triglycerides: 69 mg/dL (ref 0.0–149.0)
VLDL: 13.8 mg/dL (ref 0.0–40.0)

## 2021-12-07 LAB — VITAMIN D 25 HYDROXY (VIT D DEFICIENCY, FRACTURES): VITD: 49.36 ng/mL (ref 30.00–100.00)

## 2021-12-07 LAB — MICROALBUMIN / CREATININE URINE RATIO
Creatinine,U: 59.2 mg/dL
Microalb Creat Ratio: 1.2 mg/g (ref 0.0–30.0)
Microalb, Ur: <0.7 mg/dL (ref 0.0–1.9)

## 2021-12-07 LAB — VITAMIN B12: Vitamin B-12: 1128 pg/mL — ABNORMAL HIGH (ref 211–911)

## 2021-12-07 LAB — HEMOGLOBIN A1C: Hgb A1c MFr Bld: 6.1 % (ref 4.6–6.5)

## 2021-12-07 MED ORDER — ESCITALOPRAM OXALATE 20 MG PO TABS: 20.0000 mg | ORAL_TABLET | Freq: Every day | ORAL | 3 refills | Status: DC

## 2021-12-07 MED ORDER — LEVALBUTEROL TARTRATE 45 MCG/ACT IN AERO: 2.0000 | INHALATION_SPRAY | Freq: Four times a day (QID) | RESPIRATORY_TRACT | 3 refills | Status: DC | PRN

## 2021-12-07 NOTE — Assessment & Plan Note (Signed)
Continue vit supplementation. Today CBC, B12,and 25 OH vit D will be obtained with abs.

## 2021-12-07 NOTE — Assessment & Plan Note (Signed)
We discussed the importance of regular physical activity and healthy diet for prevention of chronic illness and/or complications. Preventive guidelines reviewed. She is due for pap smear, we did not do it today. Will come back in 12/2021 for her gyn examination. Vaccination: Shingrix given today. Ca++ and vit D supplementation to continue. Next CPE in a year.

## 2021-12-07 NOTE — Assessment & Plan Note (Signed)
Gained about 4 Lb since her last visit. She understands the benefits of wt loss as well as adverse effects of obesity. Consistency with healthy diet and physical activity encouraged. Making positive life style changes, small steps at the time.

## 2021-12-07 NOTE — Patient Instructions (Addendum)
A few things to remember from today's visit:   Routine general medical examination at a health care facility  Controlled type 2 diabetes mellitus with other specified complication, without long-term current use of insulin ([place]) - Plan: Microalbumin / creatinine urine ratio, Comprehensive metabolic panel, Hemoglobin A1c  Hyperlipidemia associated with type 2 diabetes mellitus ([place]) - Plan: Comprehensive metabolic panel, Lipid panel  Urine frequency - Plan: Urinalysis with Culture Reflex  Depression, major, recurrent, in partial remission ([place])  If you need refills for medications you take chronically, please call your pharmacy. Do not use My Chart to request refills or for acute issues that need immediate attention. If you send a my chart message, it may take a few days to be addressed, specially if I am not in the office.  Please be sure medication list is accurate. If a new problem present, please set up appointment sooner than planned today.  Preventive Care 77 Years and Older, Female Preventive care refers to lifestyle choices and visits with your health care provider that can promote health and wellness. Preventive care visits are also called wellness exams. What can I expect for my preventive care visit? Counseling Your health care provider may ask you questions about your: Medical history, including: Past medical problems. Family medical history. Pregnancy and menstrual history. History of falls. Current health, including: Memory and ability to understand (cognition). Emotional well-being. Home life and relationship well-being. Sexual activity and sexual health. Lifestyle, including: Alcohol, nicotine or tobacco, and drug use. Access to firearms. Diet, exercise, and sleep habits. Work and work Statistician. Sunscreen use. Safety issues such as seatbelt and bike helmet use. Physical exam Your health care provider will check your: Height and weight. These may be used to  calculate your BMI (body mass index). BMI is a measurement that tells if you are at a healthy weight. Waist circumference. This measures the distance around your waistline. This measurement also tells if you are at a healthy weight and may help predict your risk of certain diseases, such as type 2 diabetes and high blood pressure. Heart rate and blood pressure. Body temperature. Skin for abnormal spots. What immunizations do I need?  Vaccines are usually given at various ages, according to a schedule. Your health care provider will recommend vaccines for you based on your age, medical history, and lifestyle or other factors, such as travel or where you work. What tests do I need? Screening Your health care provider may recommend screening tests for certain conditions. This may include: Lipid and cholesterol levels. Hepatitis C test. Hepatitis B test. HIV (human immunodeficiency virus) test. STI (sexually transmitted infection) testing, if you are at risk. Lung cancer screening. Colorectal cancer screening. Diabetes screening. This is done by checking your blood sugar (glucose) after you have not eaten for a while (fasting). Mammogram. Talk with your health care provider about how often you should have regular mammograms. BRCA-related cancer screening. This may be done if you have a family history of breast, ovarian, tubal, or peritoneal cancers. Bone density scan. This is done to screen for osteoporosis. Talk with your health care provider about your test results, treatment options, and if necessary, the need for more tests. Follow these instructions at home: Eating and drinking  Eat a diet that includes fresh fruits and vegetables, whole grains, lean protein, and low-fat dairy products. Limit your intake of foods with high amounts of sugar, saturated fats, and salt. Take vitamin and mineral supplements as recommended by your health care provider. Do not  drink alcohol if your health care  provider tells you not to drink. If you drink alcohol: Limit how much you have to 0-1 drink a day. Know how much alcohol is in your drink. In the [place] one drink equals one 12 oz bottle of beer (355 mL), one 5 oz glass of wine (148 mL), or one 1 oz glass of hard liquor (44 mL). Lifestyle Brush your teeth every morning and night with fluoride toothpaste. Floss one time each day. Exercise for at least 30 minutes 5 or more days each week. Do not use any products that contain nicotine or tobacco. These products include cigarettes, chewing tobacco, and vaping devices, such as e-cigarettes. If you need help quitting, ask your health care provider. Do not use drugs. If you are sexually active, practice safe sex. Use a condom or other form of protection in order to prevent STIs. Take aspirin only as told by your health care provider. Make sure that you understand how much to take and what form to take. Work with your health care provider to find out whether it is safe and beneficial for you to take aspirin daily. Ask your health care provider if you need to take a cholesterol-lowering medicine (statin). Find healthy ways to manage stress, such as: Meditation, yoga, or listening to music. Journaling. Talking to a trusted person. Spending time with friends and family. Minimize exposure to UV radiation to reduce your risk of skin cancer. Safety Always wear your seat belt while driving or riding in a vehicle. Do not drive: If you have been drinking alcohol. Do not ride with someone who has been drinking. When you are tired or distracted. While texting. If you have been using any mind-altering substances or drugs. Wear a helmet and other protective equipment during sports activities. If you have firearms in your house, make sure you follow all gun safety procedures. What's next? Visit your health care provider once a year for an annual wellness visit. Ask your health care provider how often you  should have your eyes and teeth checked. Stay up to date on all vaccines. This information is not intended to replace advice given to you by your health care provider. Make sure you discuss any questions you have with your health care provider. Document Revised: 09/06/2020 Document Reviewed: 09/06/2020 Elsevier Patient Education  [place].

## 2021-12-07 NOTE — Assessment & Plan Note (Signed)
HgA1C has been at goal. Continue non pharmacologic treatment. Annual eye exam, periodic dental and foot care recommended. She would like annual follow ups.

## 2021-12-07 NOTE — Assessment & Plan Note (Signed)
Continue Atorvastatin 10 mg daily and low fat diet. Further recommendations according to lab FLP result.

## 2021-12-07 NOTE — Assessment & Plan Note (Signed)
Symptoms are well controlled, tried to decrease dose of meds and symptoms reoccured, so no changes in Bupropion and Citalopram dose. As far as symptoms are stable, annual follow up is appropriate.

## 2021-12-07 NOTE — Assessment & Plan Note (Signed)
Problem is well controlled. Continue Xopenex inhaler 1 to 2 puff every 6 hours as needed for wheezing.

## 2021-12-08 MED ORDER — ATORVASTATIN CALCIUM 10 MG PO TABS: 10.0000 mg | ORAL_TABLET | Freq: Every day | ORAL | 3 refills | Status: DC

## 2021-12-10 LAB — URINALYSIS W MICROSCOPIC + REFLEX CULTURE

## 2021-12-12 ENCOUNTER — Other Ambulatory Visit: Payer: Self-pay

## 2021-12-12 DIAGNOSIS — R350 Frequency of micturition: Secondary | ICD-10-CM

## 2021-12-13 ENCOUNTER — Other Ambulatory Visit (INDEPENDENT_AMBULATORY_CARE_PROVIDER_SITE_OTHER): Payer: BC Managed Care – PPO

## 2021-12-13 DIAGNOSIS — R350 Frequency of micturition: Secondary | ICD-10-CM | POA: Diagnosis not present

## 2021-12-13 LAB — URINALYSIS, ROUTINE W REFLEX MICROSCOPIC
Bilirubin Urine: NEGATIVE
Hgb urine dipstick: NEGATIVE
Ketones, ur: NEGATIVE
Nitrite: POSITIVE — AB
Specific Gravity, Urine: <=1.005 — AB (ref 1.000–1.030)
Total Protein, Urine: NEGATIVE
Urine Glucose: NEGATIVE
Urobilinogen, UA: 0.2 (ref 0.0–1.0)
pH: 6 (ref 5.0–8.0)

## 2021-12-16 LAB — URINE CULTURE
MICRO NUMBER:: 13949844
SPECIMEN QUALITY:: ADEQUATE

## 2021-12-17 ENCOUNTER — Other Ambulatory Visit: Payer: Self-pay

## 2021-12-17 ENCOUNTER — Other Ambulatory Visit: Payer: Self-pay | Admitting: Family Medicine

## 2021-12-17 MED ORDER — NITROFURANTOIN MONOHYD MACRO 100 MG PO CAPS: 100.0000 mg | ORAL_CAPSULE | Freq: Two times a day (BID) | ORAL | 0 refills | Status: AC

## 2021-12-24 ENCOUNTER — Other Ambulatory Visit: Payer: Self-pay | Admitting: Family Medicine

## 2021-12-24 DIAGNOSIS — F325 Major depressive disorder, single episode, in full remission: Secondary | ICD-10-CM

## 2022-01-15 NOTE — Progress Notes (Unsigned)
HPI: Jacqueline Hawkins is a 66 y.o. female, who is here today to follow on recent visit. She was seen on 12/07/2021 for her CPE. AM: 13 G: 2 L: 2 A: 0.  Pap smear in 09/2016:   Adequacy Satisfactory for evaluation  endocervical/transformation zone component PRESENT.   Diagnosis NEGATIVE FOR INTRAEPITHELIAL LESIONS OR MALIGNANCY.   HPV NOT DETECTED   Comment: Normal Reference Range - NOT Detected  Material Submitted CervicoVaginal Pap [ThinPrep Imaged]    Review of Systems  Constitutional:  Negative for chills and fever.  Genitourinary:  Negative for dysuria, genital sores, pelvic pain, vaginal bleeding and vaginal discharge.  Rest see pertinent positives and negatives per HPI.  Current Outpatient Medications on File Prior to Visit  Medication Sig Dispense Refill   aspirin 81 MG tablet Take 1 tablet (81 mg total) by mouth daily. 90 tablet 3   atorvastatin (LIPITOR) 10 MG tablet Take 1 tablet (10 mg total) by mouth daily. 90 tablet 3   buPROPion (WELLBUTRIN) 100 MG tablet TAKE 3 TABLETS BY MOUTH DAILY 270 tablet 2   Calcium-Vitamin D-Vitamin K (CALCIUM SOFT CHEWS) 500-500-40 MG-UNT-MCG CHEW 650mg  calcium. 12.5 mcg D vitamin 40mcg k vitamin. Eat 2 per day     cetirizine (ZYRTEC) 10 MG tablet Take 10 mg by mouth daily.     Cyanocobalamin 1000 MCG SUBL Place 1 tablet (1,000 mcg total) under the tongue daily. (Patient taking differently: Place 1,000 mcg under the tongue daily.) 90 tablet 3   escitalopram (LEXAPRO) 20 MG tablet Take 1 tablet (20 mg total) by mouth daily. 90 tablet 3   FeFum-FePoly-FA-B Cmp-C-Biot (FOLIVANE-PLUS) CAPS TAKE 1 CAPSULE BY MOUTH EVERY DAY 90 capsule 3   levalbuterol (XOPENEX HFA) 45 MCG/ACT inhaler Inhale 2 puffs into the lungs every 6 (six) hours as needed for wheezing. 1 each 3   Omega-3 Fatty Acids (FISH OIL) 1000 MG CAPS Omega 3 600mg      Probiotic Product (PROBIOTIC PO) 4 billion live per capsule 1 billion CFU     vitamin C (ASCORBIC ACID) 500 MG tablet  Take 500 mg by mouth daily.     No current facility-administered medications on file prior to visit.    Past Medical History:  Diagnosis Date   Asthma    Depression    Diabetes mellitus    no meds, AIC's normal since gastric bypass   Hyperlipidemia    OA (osteoarthritis)    Obesity    Venous insufficiency    No Known Allergies  Social History   Socioeconomic History   Marital status: Divorced    Spouse name: Not on file   Number of children: 2   Years of education: Not on file   Highest education level: Associate degree: occupational, Hotel manager, or vocational program  Occupational History   Occupation: customer Loss adjuster, chartered    Comment: [employer]  Tobacco Use   Smoking status: Former    Types: Cigarettes    Quit date: 03/26/1975    Years since quitting: 46.8   Smokeless tobacco: Never  Substance and Sexual Activity   Alcohol use: Yes    Alcohol/week: 1.0 standard drink of alcohol    Types: 1 Cans of beer per week    Comment: social   Drug use: No   Sexual activity: Never  Other Topics Concern   Not on file  Social History Narrative   Not on file   Social Determinants of Health   Financial Resource Strain: Low Risk  (01/15/2022)  Overall Financial Resource Strain (CARDIA)    Difficulty of Paying Living Expenses: Not hard at all  Food Insecurity: No Food Insecurity (01/15/2022)   Hunger Vital Sign    Worried About Running Out of Food in the Last Year: Never true    Ran Out of Food in the Last Year: Never true  Transportation Needs: No Transportation Needs (01/15/2022)   PRAPARE - Hydrologist (Medical): No    Lack of Transportation (Non-Medical): No  Physical Activity: Sufficiently Active (01/15/2022)   Exercise Vital Sign    Days of Exercise per Week: 5 days    Minutes of Exercise per Session: 30 min  Stress: No Stress Concern Present (01/15/2022)   [place]    Feeling of Stress : Not at all  Social Connections: Moderately Integrated (01/15/2022)   Social Connection and Isolation Panel [NHANES]    Frequency of Communication with Friends and Family: More than three times a week    Frequency of Social Gatherings with Friends and Family: Not on file    Attends Religious Services: More than 4 times per year    Active Member of Clubs or Organizations: Yes    Attends Archivist Meetings: More than 4 times per year    Marital Status: Divorced   Vitals:   01/16/22 0728  BP: 118/70  Pulse: 65  Resp: 16  Temp: 98.9 F (37.2 C)  SpO2: 98%   Body mass index is 44.81 kg/m.  Physical Exam Vitals and nursing note reviewed. Exam conducted with a chaperone present.  HENT:     Head: Normocephalic and atraumatic.  Pulmonary:     Effort: Pulmonary effort is normal. No respiratory distress.  Genitourinary:    Exam position: Lithotomy position.     Labia:        Right: No rash, tenderness or lesion.        Left: No rash, tenderness or lesion.      Vagina: Prolapsed vaginal walls (cystocele.) present. No vaginal discharge, erythema, tenderness or bleeding.     Cervix: No cervical motion tenderness, discharge, friability, lesion, erythema or cervical bleeding.     Uterus: Not enlarged and not tender.      Adnexa:        Right: No mass or tenderness.         Left: No mass or tenderness.       Comments: Bimanual exam was difficult due to body habits but no masses appreciated. Pap smear collected.  ASSESSMENT AND PLAN:  Jacqueline Hawkins was seen today for gynecologic exam.  Diagnoses and all orders for this visit:  Screening for malignant neoplasm of cervix -     Cytology - PAP ([facility])  We discussed current guidelines for cervical cancer screening, if Pap smear and HPV are negative, she will not need for screening.  Return if symptoms worsen or fail to improve, for Keep next appt for cpe next year..  Tasheema Perrone G. [place],  MD  [facility]. [place] office.

## 2022-01-16 ENCOUNTER — Encounter: Payer: Self-pay | Admitting: Family Medicine

## 2022-01-16 ENCOUNTER — Ambulatory Visit: Payer: BC Managed Care – PPO | Admitting: Family Medicine

## 2022-01-16 ENCOUNTER — Other Ambulatory Visit (HOSPITAL_COMMUNITY)
Admission: RE | Admit: 2022-01-16 | Discharge: 2022-01-16 | Disposition: A | Discharge status: Home / Self Care | Payer: BC Managed Care – PPO | Source: Ambulatory Visit | Attending: Family Medicine | Admitting: Family Medicine

## 2022-01-16 VITALS — BP 118/70 | HR 65 | Temp 98.9°F | Resp 16 | Ht 62.0 in | Wt 245.0 lb

## 2022-01-16 DIAGNOSIS — Z124 Encounter for screening for malignant neoplasm of cervix: Secondary | ICD-10-CM

## 2022-01-16 DIAGNOSIS — Z23 Encounter for immunization: Secondary | ICD-10-CM

## 2022-01-16 NOTE — Addendum Note (Signed)
Addended by: Rodrigo Ran on: 01/16/2022 08:02 AM   Modules accepted: Orders

## 2022-01-16 NOTE — Patient Instructions (Addendum)
A few things to remember from today's visit:  Screening for malignant neoplasm of cervix - Plan: Cytology - PAP ([facility]) Pap smear done today.  If normal you need more Pap smear in the future.  Please be sure medication list is accurate. If a new problem present, please set up appointment sooner than planned today.

## 2022-01-17 LAB — CYTOLOGY - PAP
Comment: NEGATIVE
Diagnosis: NEGATIVE
High risk HPV: POSITIVE — AB

## 2022-01-21 ENCOUNTER — Other Ambulatory Visit: Payer: Self-pay

## 2022-01-21 DIAGNOSIS — G4733 Obstructive sleep apnea (adult) (pediatric): Secondary | ICD-10-CM | POA: Diagnosis not present

## 2022-01-21 DIAGNOSIS — R87618 Other abnormal cytological findings on specimens from cervix uteri: Secondary | ICD-10-CM

## 2022-03-04 ENCOUNTER — Encounter: Payer: Self-pay | Admitting: Family Medicine

## 2022-03-04 ENCOUNTER — Telehealth (INDEPENDENT_AMBULATORY_CARE_PROVIDER_SITE_OTHER): Payer: BC Managed Care – PPO | Admitting: Family Medicine

## 2022-03-04 VITALS — Ht 62.0 in

## 2022-03-04 DIAGNOSIS — U071 COVID-19: Secondary | ICD-10-CM | POA: Diagnosis not present

## 2022-03-04 MED ORDER — NIRMATRELVIR/RITONAVIR (PAXLOVID)TABLET: 3.0000 | ORAL_TABLET | Freq: Two times a day (BID) | ORAL | 0 refills | Status: AC

## 2022-03-04 NOTE — Progress Notes (Signed)
Virtual Visit via Video Note I connected with Jacqueline Hawkins on 03/04/22 by a video enabled telemedicine application and verified that I am speaking with the correct person using two identifiers. Location patient: home Location provider:work office Persons participating in the virtual visit: patient, provider  I discussed the limitations of evaluation and management by telemedicine and the availability of in person appointments. The patient expressed understanding and agreed to proceed.  Chief Complaint  Patient presents with   Covid Positive   HPI: Jacqueline Hawkins is a 66 yo female with PMHx significant for asthma, OSA, HLD, DM II, and HLD reporting a recent positive COVID-19 test result, which was confirmed yesterday.  She reports having received the COVID vaccine.  She started with respiratory symptoms yesterday, including a fever of 102F, which has since decreased to below 100F today without taking any medication.  She also reports experiencing a fatigue, headache, nasal congestion,rhinorrhea,post nasal drainage, mild dysphonia, and productive cough. Negative for anosmia,ageusia,sore throat, wheezing,SOB,chest pain, palpitations, nausea, vomiting, changes in bowel habits, abdominal pain, urinary symptoms,body,aches,or skin rash.  She is currently taking Tylenol for symptom management.  She suspects that she may have contracted COVID-19 at her workplace.  ROS: See pertinent positives and negatives per HPI.  Past Medical History:  Diagnosis Date   Asthma    Depression    Diabetes mellitus    no meds, AIC's normal since gastric bypass   Hyperlipidemia    OA (osteoarthritis)    Obesity    Venous insufficiency    Past Surgical History:  Procedure Laterality Date   GASTRIC BYPASS     TOTAL HIP ARTHROPLASTY Left    Family History  Problem Relation Age of Onset   Diabetes Father    Heart disease Father    Thyroid disease Mother    Breast cancer Maternal Aunt    Social History    Socioeconomic History   Marital status: Divorced    Spouse name: Not on file   Number of children: 2   Years of education: Not on file   Highest education level: Associate degree: occupational, Hotel manager, or vocational program  Occupational History   Occupation: customer Loss adjuster, chartered    Comment: [employer]  Tobacco Use   Smoking status: Former    Types: Cigarettes    Quit date: 03/26/1975    Years since quitting: 46.9   Smokeless tobacco: Never  Substance and Sexual Activity   Alcohol use: Yes    Alcohol/week: 1.0 standard drink of alcohol    Types: 1 Cans of beer per week    Comment: social   Drug use: No   Sexual activity: Never  Other Topics Concern   Not on file  Social History Narrative   Not on file   Social Determinants of Health   Financial Resource Strain: Low Risk  (01/15/2022)   Overall Financial Resource Strain (CARDIA)    Difficulty of Paying Living Expenses: Not hard at all  Food Insecurity: No Food Insecurity (01/15/2022)   Hunger Vital Sign    Worried About Running Out of Food in the Last Year: Never true    Ran Out of Food in the Last Year: Never true  Transportation Needs: No Transportation Needs (01/15/2022)   PRAPARE - Hydrologist (Medical): No    Lack of Transportation (Non-Medical): No  Physical Activity: Sufficiently Active (01/15/2022)   Exercise Vital Sign    Days of Exercise per Week: 5 days    Minutes  of Exercise per Session: 30 min  Stress: No Stress Concern Present (01/15/2022)   [place]    Feeling of Stress : Not at all  Social Connections: Moderately Integrated (01/15/2022)   Social Connection and Isolation Panel [NHANES]    Frequency of Communication with Friends and Family: More than three times a week    Frequency of Social Gatherings with Friends and Family: Not on file    Attends Religious Services: More than 4 times  per year    Active Member of [employer] or Organizations: Yes    Attends Music therapist: More than 4 times per year    Marital Status: Divorced  Human resources officer Violence: Not on file    Current Outpatient Medications:    aspirin 81 MG tablet, Take 1 tablet (81 mg total) by mouth daily., Disp: 90 tablet, Rfl: 3   atorvastatin (LIPITOR) 10 MG tablet, Take 1 tablet (10 mg total) by mouth daily., Disp: 90 tablet, Rfl: 3   buPROPion (WELLBUTRIN) 100 MG tablet, TAKE 3 TABLETS BY MOUTH DAILY, Disp: 270 tablet, Rfl: 2   Calcium-Vitamin D-Vitamin K (CALCIUM SOFT CHEWS) 500-500-40 MG-UNT-MCG CHEW, 650mg  calcium. 12.5 mcg D vitamin 13mcg k vitamin. Eat 2 per day, Disp: , Rfl:    cetirizine (ZYRTEC) 10 MG tablet, Take 10 mg by mouth daily., Disp: , Rfl:    Cyanocobalamin 1000 MCG SUBL, Place 1 tablet (1,000 mcg total) under the tongue daily. (Patient taking differently: Place 1,000 mcg under the tongue daily.), Disp: 90 tablet, Rfl: 3   escitalopram (LEXAPRO) 20 MG tablet, Take 1 tablet (20 mg total) by mouth daily., Disp: 90 tablet, Rfl: 3   FeFum-FePoly-FA-B Cmp-C-Biot (FOLIVANE-PLUS) CAPS, TAKE 1 CAPSULE BY MOUTH EVERY DAY, Disp: 90 capsule, Rfl: 3   levalbuterol (XOPENEX HFA) 45 MCG/ACT inhaler, Inhale 2 puffs into the lungs every 6 (six) hours as needed for wheezing., Disp: 1 each, Rfl: 3   nirmatrelvir/ritonavir EUA (PAXLOVID) 20 x 150 MG & 10 x 100MG  TABS, Take 3 tablets by mouth 2 (two) times daily for 5 days. (Take nirmatrelvir 150 mg two tablets twice daily for 5 days and ritonavir 100 mg one tablet twice daily for 5 days) Patient GFR is 90.6, Disp: 30 tablet, Rfl: 0   Omega-3 Fatty Acids (FISH OIL) 1000 MG CAPS, Omega 3 600mg , Disp: , Rfl:    Probiotic Product (PROBIOTIC PO), 4 billion live per capsule 1 billion CFU, Disp: , Rfl:    vitamin C (ASCORBIC ACID) 500 MG tablet, Take 500 mg by mouth daily., Disp: , Rfl:   EXAM:  VITALS per patient if applicable:Ht 5\' 2"  (1.575 m)   LMP  06/11/2010   BMI 44.81 kg/m   GENERAL: alert, oriented, appears well and in no acute distress  HEENT: atraumatic, conjunctiva clear, no obvious abnormalities on inspection of external nose and ears Nasal congestion.  NECK: normal movements of the head and neck  LUNGS: on inspection no signs of respiratory distress, breathing rate appears normal, no obvious gross SOB, gasping or wheezing  CV: no obvious cyanosis  MS: moves all visible extremities without noticeable abnormality  PSYCH/NEURO: pleasant and cooperative, no obvious depression or anxiety, speech and thought processing grossly intact  ASSESSMENT AND PLAN:  Discussed the following assessment and plan:  COVID-19 virus infection - Plan: nirmatrelvir/ritonavir EUA (PAXLOVID) 20 x 150 MG & 10 x 100MG  TABS We discussed Dx,possible complications and treatment options. She has a mild to moderate case  with risk for complications. We discussed oral antiviral options and side effects. She agrees with trying Paxlovid. Instructed to hold on Atorvastatin while taking antiviral medication. Symptomatic treatment with plenty of fluids,rest,tylenol 500 mg 3-4 times per day prn. 5-7 days of quarantine, she states that she does not need a letter for work. Cough is mild, she does not feel like she needs cough medication. Clearly instructed about warning signs.  We discussed possible serious and likely etiologies, options for evaluation and workup, limitations of telemedicine visit vs in person visit, treatment, treatment risks and precautions. The patient was advised to call back or seek an in-person evaluation if the symptoms worsen or if the condition fails to improve as anticipated. I discussed the assessment and treatment plan with the patient. The patient was provided an opportunity to ask questions and all were answered. The patient agreed with the plan and demonstrated an understanding of the instructions.  Return if symptoms worsen  or fail to improve, for keep next appointment. Alajah Witman G. [place], MD  [facility]. [place] office.

## 2022-03-06 ENCOUNTER — Ambulatory Visit: Payer: BC Managed Care – PPO | Admitting: Obstetrics and Gynecology

## 2022-03-28 ENCOUNTER — Ambulatory Visit: Payer: BC Managed Care – PPO | Admitting: Obstetrics and Gynecology

## 2022-03-28 ENCOUNTER — Encounter: Payer: Self-pay | Admitting: Obstetrics and Gynecology

## 2022-03-28 VITALS — BP 122/78 | HR 87 | Ht 63.5 in | Wt 247.0 lb

## 2022-03-28 DIAGNOSIS — R87810 Cervical high risk human papillomavirus (HPV) DNA test positive: Secondary | ICD-10-CM | POA: Diagnosis not present

## 2022-03-28 NOTE — Progress Notes (Signed)
67 y.o. No obstetric history on file. Divorced Caucasian female here for [place] for colpo consult.    Pap 01/16/22 showed normal cells and positive HR HPV.   Not sexually active in 13 years.   Just got over Covid.  PCP:   Betty Martinique, MD  Patient's last menstrual period was 06/11/2010.           Sexually active: No.  The current method of family planning is post menopausal status.    Exercising: Yes.   15-20 min a day walking Smoker:  former  Health Maintenance: Pap:  01/16/22 negative: HR HPV positive, 10/21/16 negative: HR HPV negative, 06/19/15 negative pap, positive HR HPV, 07/04/11 negative History of abnormal Pap:  yes MMG:  11/15/21 Breast Density Category B, BI-RADS CATEGORY 1 Negative Colonoscopy:  07/21/15 BMD:   10/24/17  Result  osteopenia TDaP:  02/10/14 Gardasil:   no HIV: donated blood Hep C: donated blood Screening Labs:  PCP   reports that she quit smoking about 47 years ago. Her smoking use included cigarettes. She has never used smokeless tobacco. She reports current alcohol use of about 1.0 standard drink of alcohol per week. She reports that she does not use drugs.  Past Medical History:  Diagnosis Date   Asthma    Depression    Diabetes mellitus    no meds, AIC's normal since gastric bypass   Hyperlipidemia    OA (osteoarthritis)    Obesity    Venous insufficiency     Past Surgical History:  Procedure Laterality Date   GASTRIC BYPASS     TOTAL HIP ARTHROPLASTY Left     Current Outpatient Medications  Medication Sig Dispense Refill   aspirin 81 MG tablet Take 1 tablet (81 mg total) by mouth daily. 90 tablet 3   atorvastatin (LIPITOR) 10 MG tablet Take 1 tablet (10 mg total) by mouth daily. 90 tablet 3   buPROPion (WELLBUTRIN) 100 MG tablet TAKE 3 TABLETS BY MOUTH DAILY 270 tablet 2   Calcium-Vitamin D-Vitamin K (CALCIUM SOFT CHEWS) 500-500-40 MG-UNT-MCG CHEW 650mg  calcium. 12.5 mcg D vitamin 11mcg k vitamin. Eat 2 per day     cetirizine (ZYRTEC)  10 MG tablet Take 10 mg by mouth daily.     Cyanocobalamin 1000 MCG SUBL Place 1 tablet (1,000 mcg total) under the tongue daily. (Patient taking differently: Place 1,000 mcg under the tongue daily.) 90 tablet 3   escitalopram (LEXAPRO) 20 MG tablet Take 1 tablet (20 mg total) by mouth daily. 90 tablet 3   FeFum-FePoly-FA-B Cmp-C-Biot (FOLIVANE-PLUS) CAPS TAKE 1 CAPSULE BY MOUTH EVERY DAY 90 capsule 3   levalbuterol (XOPENEX HFA) 45 MCG/ACT inhaler Inhale 2 puffs into the lungs every 6 (six) hours as needed for wheezing. 1 each 3   Multiple Vitamins-Minerals (MULTIVITAMIN & MINERAL PO)      Omega-3 Fatty Acids (FISH OIL) 1000 MG CAPS Omega 3 600mg      Probiotic Product (PROBIOTIC PO) 4 billion live per capsule 1 billion CFU     vitamin C (ASCORBIC ACID) 500 MG tablet Take 500 mg by mouth daily.     No current facility-administered medications for this visit.    Family History  Problem Relation Age of Onset   Diabetes Father    Heart disease Father    Thyroid disease Mother    Breast cancer Maternal Aunt     Review of Systems  All other systems reviewed and are negative.   Exam:   BP 122/78 (BP Location:  Right Arm, Patient Position: Sitting, Cuff Size: Normal)   Pulse 87   Ht 5' 3.5" (1.613 m)   Wt 247 lb (112 kg)   LMP 06/11/2010   SpO2 99%   BMI 43.07 kg/m     General appearance: alert, cooperative and appears stated age   Pelvic: External genitalia:  no lesions              No abnormal inguinal nodes palpated.              Urethra:  normal appearing urethra with no masses, tenderness or lesions              Bartholins and Skenes: normal                 Vagina: normal appearing vagina with normal color and discharge, no lesions              Cervix: no lesions              Pap taken: no Bimanual Exam:  Uterus:  normal size, contour, position, consistency, mobility, non-tender              Adnexa: no mass, fullness, tenderness              Rectal exam: yes.  Confirms.               Anus:  normal sphincter tone, no lesions  Chaperone was present for exam:  Emily  Assessment:   Positive HR HPV of cervix, recurrent.  Recent Covid infection.   Plan: HPV, abnormal paps, colposcopy, and LEEP discussed.  Return for colposcopy appointment.  Will collect pap and HR HPV at that visit as well.  ACOG information on colposcopy to patient.  Questions invited and answered.  After visit summary provided.

## 2022-04-16 NOTE — Progress Notes (Signed)
GYNECOLOGY  VISIT   HPI: 67 y.o.   Divorced  Caucasian  female   G2P0 with Patient's last menstrual period was 06/11/2010.   here for   colpo and pap.   Pap 01/16/22 showed normal cells and positive HR HPV.   States foul smelling urine.  Hx UTI in the past when her urine had odor.  Denies dysuria.  GYNECOLOGIC HISTORY: Patient's last menstrual period was 06/11/2010. Contraception:  PMP Menopausal hormone therapy:  n/a Last mammogram:  11/15/21 Breast Density Category B, BI-RADS CATEGORY 1 Neg Last pap smear:   01/16/22 neg: HR HPV positive, 10/21/16 neg: HR HPV neg        OB History     Gravida  2   Para      Term      Preterm      AB      Living  2      SAB      IAB      Ectopic      Multiple      Live Births                 Patient Active Problem List   Diagnosis Date Noted   Intussusception ([place]) 09/11/2020   OSA (obstructive sleep apnea) 11/10/2019   Diabetes mellitus type II, controlled ([place])  09/23/2018   Depression, major, recurrent, in partial remission ([place]) 09/12/2017   History of bariatric surgery 03/28/2017   Routine general medical examination at a health care facility 06/19/2015   Iron deficiency anemia 06/19/2015   INCONTINENCE 07/31/2009   Asthma, mild 04/20/2008   Venous (peripheral) insufficiency 03/07/2008   Obesity, morbid, BMI 40.0-49.9 ([place]) 02/01/2008   Hyperlipidemia associated with type 2 diabetes mellitus ([place]) 12/29/2007   Depressive disorder 12/29/2007   OSTEOARTHRITIS 11/05/2006    Past Medical History:  Diagnosis Date   Asthma    Depression    Diabetes mellitus    no meds, AIC's normal since gastric bypass   Hyperlipidemia    OA (osteoarthritis)    Obesity    Venous insufficiency     Past Surgical History:  Procedure Laterality Date   GASTRIC BYPASS     TOTAL HIP ARTHROPLASTY Left     Current Outpatient Medications  Medication Sig Dispense Refill   aspirin 81 MG tablet Take 1 tablet (81 mg total) by  mouth daily. 90 tablet 3   atorvastatin (LIPITOR) 10 MG tablet Take 1 tablet (10 mg total) by mouth daily. 90 tablet 3   buPROPion (WELLBUTRIN) 100 MG tablet TAKE 3 TABLETS BY MOUTH DAILY 270 tablet 2   Calcium-Vitamin D-Vitamin K (CALCIUM SOFT CHEWS) 500-500-40 MG-UNT-MCG CHEW 650mg  calcium. 12.5 mcg D vitamin 14mcg k vitamin. Eat 2 per day     cetirizine (ZYRTEC) 10 MG tablet Take 10 mg by mouth daily.     escitalopram (LEXAPRO) 20 MG tablet Take 1 tablet (20 mg total) by mouth daily. 90 tablet 3   FeFum-FePoly-FA-B Cmp-C-Biot (FOLIVANE-PLUS) CAPS TAKE 1 CAPSULE BY MOUTH EVERY DAY 90 capsule 3   levalbuterol (XOPENEX HFA) 45 MCG/ACT inhaler Inhale 2 puffs into the lungs every 6 (six) hours as needed for wheezing. 1 each 3   Multiple Vitamins-Minerals (MULTIVITAMIN & MINERAL PO)      Omega-3 Fatty Acids (FISH OIL) 1000 MG CAPS Omega 3 600mg      Probiotic Product (PROBIOTIC PO) 4 billion live per capsule 1 billion CFU     vitamin C (ASCORBIC ACID) 500 MG tablet Take  500 mg by mouth daily.     Cyanocobalamin 1000 MCG SUBL Place 1 tablet (1,000 mcg total) under the tongue daily. (Patient not taking: Reported on 04/30/2022) 90 tablet 3   No current facility-administered medications for this visit.     ALLERGIES: Patient has no known allergies.  Family History  Problem Relation Age of Onset   Diabetes Father    Heart disease Father    Thyroid disease Mother    Breast cancer Maternal Aunt     Social History   Socioeconomic History   Marital status: Divorced    Spouse name: Not on file   Number of children: 2   Years of education: Not on file   Highest education level: Associate degree: occupational, Hotel manager, or vocational program  Occupational History   Occupation: customer Loss adjuster, chartered    Comment: [employer]  Tobacco Use   Smoking status: Former    Types: Cigarettes    Quit date: 03/26/1975    Years since quitting: 47.1   Smokeless tobacco: Never  Substance and  Sexual Activity   Alcohol use: Yes    Alcohol/week: 1.0 standard drink of alcohol    Types: 1 Cans of beer per week    Comment: social   Drug use: No   Sexual activity: Never  Other Topics Concern   Not on file  Social History Narrative   Not on file   Social Determinants of Health   Financial Resource Strain: Low Risk  (01/15/2022)   Overall Financial Resource Strain (CARDIA)    Difficulty of Paying Living Expenses: Not hard at all  Food Insecurity: No Food Insecurity (01/15/2022)   Hunger Vital Sign    Worried About Running Out of Food in the Last Year: Never true    Ran Out of Food in the Last Year: Never true  Transportation Needs: No Transportation Needs (01/15/2022)   PRAPARE - Hydrologist (Medical): No    Lack of Transportation (Non-Medical): No  Physical Activity: Sufficiently Active (01/15/2022)   Exercise Vital Sign    Days of Exercise per Week: 5 days    Minutes of Exercise per Session: 30 min  Stress: No Stress Concern Present (01/15/2022)   [place]    Feeling of Stress : Not at all  Social Connections: Moderately Integrated (01/15/2022)   Social Connection and Isolation Panel [NHANES]    Frequency of Communication with Friends and Family: More than three times a week    Frequency of Social Gatherings with Friends and Family: Not on file    Attends Religious Services: More than 4 times per year    Active Member of [employer] or Organizations: Yes    Attends Music therapist: More than 4 times per year    Marital Status: Divorced  Human resources officer Violence: Not on file    Review of Systems  See HPI.   PHYSICAL EXAMINATION:    BP 110/70 (BP Location: Right Arm, Patient Position: Sitting, Cuff Size: Large)   Wt 246 lb (111.6 kg)   LMP 06/11/2010   BMI 42.89 kg/m     General appearance: alert, cooperative and appears stated age  Colposcopy - cervix,  vagina. Consent for procedure.  Pap and HR HPV collected.  3% acetic acid used in vagina. White light and green light filter used.  Colposcopy satisfactory:  Yes   __x___          No  _____ Findings:    Bladder prolapsing over the cervix.   Cervix:  Ectropion and increased vascularity of the cervix circumferentially.  Vagina:  on lesions.   Hibiclens prep to cervix.  Tenaculum to anterior cervical lip.  Biopsies:  ECC, 6:00, 9:00, 3:00 - all to pathology separately. Tenaculum removed.  Monsel's placed.  Minimal EBL. No complications.   Chaperone was present for exam:  Clydie Braun  ASSESSMENT  Positive HR HPV.  Malodorous urine.   PLAN  FU pap and Hr HPV.  FU biopsies.  Urinalysis and reflex culture.  Final plan to follow.    An After Visit Summary was printed and given to the patient.  In addition to colposcopy today, patient received evaluation for malodorous urine. Urine and reflex urine culture ordered.  No abx at this time.

## 2022-04-22 DIAGNOSIS — G4733 Obstructive sleep apnea (adult) (pediatric): Secondary | ICD-10-CM | POA: Diagnosis not present

## 2022-04-30 ENCOUNTER — Other Ambulatory Visit (HOSPITAL_COMMUNITY)
Admission: RE | Admit: 2022-04-30 | Discharge: 2022-04-30 | Disposition: A | Discharge status: Home / Self Care | Payer: BC Managed Care – PPO | Source: Ambulatory Visit | Attending: Obstetrics and Gynecology | Admitting: Obstetrics and Gynecology

## 2022-04-30 ENCOUNTER — Ambulatory Visit (INDEPENDENT_AMBULATORY_CARE_PROVIDER_SITE_OTHER): Payer: BC Managed Care – PPO | Admitting: Obstetrics and Gynecology

## 2022-04-30 ENCOUNTER — Encounter: Payer: Self-pay | Admitting: Obstetrics and Gynecology

## 2022-04-30 VITALS — BP 110/70 | Wt 246.0 lb

## 2022-04-30 DIAGNOSIS — R8290 Unspecified abnormal findings in urine: Secondary | ICD-10-CM

## 2022-04-30 DIAGNOSIS — B9620 Unspecified Escherichia coli [E. coli] as the cause of diseases classified elsewhere: Secondary | ICD-10-CM | POA: Diagnosis not present

## 2022-04-30 DIAGNOSIS — R87810 Cervical high risk human papillomavirus (HPV) DNA test positive: Secondary | ICD-10-CM | POA: Insufficient documentation

## 2022-04-30 NOTE — Patient Instructions (Signed)
Colposcopy, Care After  The following information offers guidance on how to care for yourself after your procedure. Your health care provider may also give you more specific instructions. If you have problems or questions, contact your health care provider. What can I expect after the procedure? If you had a colposcopy without a biopsy, you can expect to feel fine right away after your procedure. However, you may have some spotting of blood for a few days. You can return to your normal activities. If you had a colposcopy with a biopsy, it is common after the procedure to have: Soreness and mild pain. These may last for a few days. Mild vaginal bleeding or discharge that is dark-colored and grainy. This may last for a few days. The discharge may be caused by a liquid (solution) that was used during the procedure. You may need to wear a sanitary pad during this time. Spotting of blood for at least 48 hours after the procedure. Follow these instructions at home: Medicines Take over-the-counter and prescription medicines only as told by your health care provider. Talk with your health care provider about what type of over-the-counter pain medicines and prescription medicines you can start to take again. It is especially important to talk with your health care provider if you take blood thinners. Activity Avoid using douche products, using tampons, and having sex for at least 3 days after the procedure or for as long as told by your health care provider. Return to your normal activities as told by your health care provider. Ask your health care provider what activities are safe for you. General instructions Ask your health care provider if you may take baths, swim, or use a hot tub. You may take showers. If you use birth control (contraception), continue to use it. Keep all follow-up visits. This is important. Contact a health care provider if: You have a fever or chills. You faint or feel  light-headed. Get help right away if: You have heavy bleeding from your vagina or pass blood clots. Heavy bleeding is bleeding that soaks through a sanitary pad in less than 1 hour. You have vaginal discharge that is abnormal, is yellow in color, or smells bad. This could be a sign of infection. You have severe pain or cramps in your lower abdomen that do not go away with medicine. Summary If you had a colposcopy without a biopsy, you can expect to feel fine right away, but you may have some spotting of blood for a few days. You can return to your normal activities. If you had a colposcopy with a biopsy, it is common to have mild pain for a few days and spotting for 48 hours after the procedure. Avoid using douche products, using tampons, and having sex for at least 3 days after the procedure or for as long as told by your health care provider. Get help right away if you have heavy bleeding, severe pain, or signs of infection. This information is not intended to replace advice given to you by your health care provider. Make sure you discuss any questions you have with your health care provider. Document Revised: 08/06/2020 Document Reviewed: 08/06/2020 Elsevier Patient Education  2023 Elsevier Inc.  

## 2022-05-02 LAB — SURGICAL PATHOLOGY

## 2022-05-03 LAB — URINALYSIS, COMPLETE W/RFL CULTURE
Bilirubin Urine: NEGATIVE
Casts: NONE SEEN /LPF
Crystals: NONE SEEN /HPF
Glucose, UA: NEGATIVE
Hgb urine dipstick: NEGATIVE
Hyaline Cast: NONE SEEN /LPF
Ketones, ur: NEGATIVE
Nitrites, Initial: POSITIVE — AB
Protein, ur: NEGATIVE
RBC / HPF: NONE SEEN /HPF (ref 0–2)
Specific Gravity, Urine: 1.01 (ref 1.001–1.035)
Yeast: NONE SEEN /HPF
pH: 5.5 (ref 5.0–8.0)

## 2022-05-03 LAB — CULTURE INDICATED

## 2022-05-03 LAB — URINE CULTURE
MICRO NUMBER:: 14525527
SPECIMEN QUALITY:: ADEQUATE

## 2022-05-03 LAB — CYTOLOGY - PAP
Comment: NEGATIVE
Diagnosis: HIGH — AB
High risk HPV: POSITIVE — AB

## 2022-05-05 ENCOUNTER — Other Ambulatory Visit: Payer: Self-pay | Admitting: Obstetrics and Gynecology

## 2022-05-05 MED ORDER — SULFAMETHOXAZOLE-TRIMETHOPRIM 800-160 MG PO TABS: 1.0000 | ORAL_TABLET | Freq: Two times a day (BID) | ORAL | 0 refills | Status: DC

## 2022-05-29 NOTE — Progress Notes (Signed)
GYNECOLOGY  VISIT   HPI: 67 y.o.   Divorced  Caucasian  female   G64P2 with Patient's last menstrual period was 06/11/2010.   here for   re-examine and discuss leep procedure.  Pap 01/16/22 showed normal cells and positive HR HPV.   Pap 04/30/22 showed HGSIL, positive HR HPV. Colposcopy 04/30/22 showed ECC benign endocervix and multiple biopsies with CIN III.  Her cervix had increased vascularily circumferentially at the time of the procedure, and her bladder was prolapsing over the cervix with her colposcopy.   Last day of work in 06/19/22.  She is applying for Medicare part B.   GYNECOLOGIC HISTORY: Patient's last menstrual period was 06/11/2010. Contraception:  PMP/ abstinence Menopausal hormone therapy:  n/a Last mammogram:  11/15/21 Breast Density Category B, BI-RADS CATEGORY 1 Neg  Last pap smear:   04/30/22 HSIL: HR HPV positive, 01/16/22 neg: HR HPV positive, 10/21/16 neg: HR HPV neg         OB History     Gravida  2   Para      Term      Preterm      AB      Living  2      SAB      IAB      Ectopic      Multiple      Live Births                 Patient Active Problem List   Diagnosis Date Noted   Mixed conductive and sensorineural hearing loss of both ears 05/30/2021   Intussusception ([place]) 09/11/2020   OSA (obstructive sleep apnea) 11/10/2019   Diabetes mellitus type II, controlled ([place])  09/23/2018   Depression, major, recurrent, in partial remission ([place]) 09/12/2017   History of bariatric surgery 03/28/2017   Routine general medical examination at a health care facility 06/19/2015   Iron deficiency anemia 06/19/2015   INCONTINENCE 07/31/2009   Asthma, mild 04/20/2008   Venous (peripheral) insufficiency 03/07/2008   Obesity, morbid, BMI 40.0-49.9 ([place]) 02/01/2008   Hyperlipidemia associated with type 2 diabetes mellitus ([place]) 12/29/2007   Depressive disorder 12/29/2007   OSTEOARTHRITIS 11/05/2006    Past Medical History:  Diagnosis Date    Asthma    Depression    Diabetes mellitus    no meds, AIC's normal since gastric bypass   Hyperlipidemia    OA (osteoarthritis)    Obesity    Venous insufficiency     Past Surgical History:  Procedure Laterality Date   GASTRIC BYPASS     TOTAL HIP ARTHROPLASTY Left     Current Outpatient Medications  Medication Sig Dispense Refill   aspirin 81 MG tablet Take 1 tablet (81 mg total) by mouth daily. 90 tablet 3   atorvastatin (LIPITOR) 10 MG tablet Take 1 tablet (10 mg total) by mouth daily. 90 tablet 3   buPROPion (WELLBUTRIN) 100 MG tablet TAKE 3 TABLETS BY MOUTH DAILY 270 tablet 2   Calcium-Vitamin D-Vitamin K (CALCIUM SOFT CHEWS) 500-500-40 MG-UNT-MCG CHEW 650mg  calcium. 12.5 mcg D vitamin 4mcg k vitamin. Eat 2 per day     cetirizine (ZYRTEC) 10 MG tablet Take 10 mg by mouth daily.     Cyanocobalamin 1000 MCG SUBL Place 1 tablet (1,000 mcg total) under the tongue daily. 90 tablet 3   escitalopram (LEXAPRO) 20 MG tablet Take 1 tablet (20 mg total) by mouth daily. 90 tablet 3   FeFum-FePoly-FA-B Cmp-C-Biot (FOLIVANE-PLUS) CAPS TAKE 1 CAPSULE BY MOUTH  EVERY DAY 90 capsule 3   levalbuterol (XOPENEX HFA) 45 MCG/ACT inhaler Inhale 2 puffs into the lungs every 6 (six) hours as needed for wheezing. 1 each 3   Multiple Vitamins-Minerals (MULTIVITAMIN & MINERAL PO)      Omega-3 Fatty Acids (FISH OIL) 1000 MG CAPS Omega 3 600mg      Probiotic Product (PROBIOTIC PO) 4 billion live per capsule 1 billion CFU     vitamin C (ASCORBIC ACID) 500 MG tablet Take 500 mg by mouth daily.     No current facility-administered medications for this visit.     ALLERGIES: Patient has no known allergies.  Family History  Problem Relation Age of Onset   Diabetes Father    Heart disease Father    Thyroid disease Mother    Breast cancer Maternal Aunt     Social History   Socioeconomic History   Marital status: Divorced    Spouse name: Not on file   Number of children: 2   Years of education: Not  on file   Highest education level: Associate degree: occupational, Hotel manager, or vocational program  Occupational History   Occupation: customer Loss adjuster, chartered    Comment: [employer]  Tobacco Use   Smoking status: Former    Types: Cigarettes    Quit date: 03/26/1975    Years since quitting: 47.2   Smokeless tobacco: Never  Substance and Sexual Activity   Alcohol use: Yes    Alcohol/week: 1.0 standard drink of alcohol    Types: 1 Cans of beer per week    Comment: social   Drug use: No   Sexual activity: Never  Other Topics Concern   Not on file  Social History Narrative   Not on file   Social Determinants of Health   Financial Resource Strain: Low Risk  (01/15/2022)   Overall Financial Resource Strain (CARDIA)    Difficulty of Paying Living Expenses: Not hard at all  Food Insecurity: No Food Insecurity (01/15/2022)   Hunger Vital Sign    Worried About Running Out of Food in the Last Year: Never true    Ran Out of Food in the Last Year: Never true  Transportation Needs: No Transportation Needs (01/15/2022)   PRAPARE - Hydrologist (Medical): No    Lack of Transportation (Non-Medical): No  Physical Activity: Sufficiently Active (01/15/2022)   Exercise Vital Sign    Days of Exercise per Week: 5 days    Minutes of Exercise per Session: 30 min  Stress: No Stress Concern Present (01/15/2022)   [place]    Feeling of Stress : Not at all  Social Connections: Moderately Integrated (01/15/2022)   Social Connection and Isolation Panel [NHANES]    Frequency of Communication with Friends and Family: More than three times a week    Frequency of Social Gatherings with Friends and Family: Not on file    Attends Religious Services: More than 4 times per year    Active Member of [employer] or Organizations: Yes    Attends Music therapist: More than 4 times per year     Marital Status: Divorced  Human resources officer Violence: Not on file    Review of Systems  All other systems reviewed and are negative.   PHYSICAL EXAMINATION:    BP 122/76 (BP Location: Left Arm, Patient Position: Sitting, Cuff Size: Large)   Pulse 94   Ht 5' 3.5" (1.613 m)  Wt 244 lb (110.7 kg)   LMP 06/11/2010   SpO2 97%   BMI 42.54 kg/m     General appearance: alert, cooperative and appears stated age   Pelvic: External genitalia:  no lesions              Urethra:  normal appearing urethra with no masses, tenderness or lesions              Bartholins and Skenes: normal                 Vagina: normal appearing vagina with normal color and discharge, no lesions              Cervix: mottled, increased vascularity.  Appears that cervical lesion/dysplasia extends far laterally to the right and left and onto the left vaginal apex.                 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  PLAN  We discussed LEEP and cold knife conization of cervix +/- treatment of vaginal dysplasia in the OR setting.  Will refer to [facility] for further evaluation and treatment.    30 min  total time was spent for this patient encounter, including preparation, face-to-face counseling with the patient, coordination of care, and documentation of the encounter.

## 2022-06-12 ENCOUNTER — Telehealth: Payer: Self-pay | Admitting: Obstetrics and Gynecology

## 2022-06-12 ENCOUNTER — Encounter: Payer: Self-pay | Admitting: Obstetrics and Gynecology

## 2022-06-12 ENCOUNTER — Ambulatory Visit: Payer: BC Managed Care – PPO | Admitting: Obstetrics and Gynecology

## 2022-06-12 VITALS — BP 122/76 | HR 94 | Ht 63.5 in | Wt 244.0 lb

## 2022-06-12 DIAGNOSIS — D069 Carcinoma in situ of cervix, unspecified: Secondary | ICD-10-CM

## 2022-06-12 NOTE — Telephone Encounter (Signed)
GYN ONC referral placed in Epic.

## 2022-06-12 NOTE — Telephone Encounter (Signed)
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    Large treatment area needed.

## 2022-06-13 ENCOUNTER — Telehealth: Payer: Self-pay | Admitting: *Deleted

## 2022-06-13 NOTE — Telephone Encounter (Signed)
LMOM for the patient to call the office back. Patient needs to be scheduled for a new patient appt on either 3/28, 4/1 or 4/5

## 2022-06-14 NOTE — Telephone Encounter (Signed)
LMOM for the patient to call the office back. Patient needs to be scheduled for a new patient appt on either 3/28, 4/1 or 4/5

## 2022-06-17 NOTE — Telephone Encounter (Signed)
Routing to Bianca to f/u on referral.    

## 2022-06-18 NOTE — Telephone Encounter (Signed)
Spoke with the patient and patient wants to  postpone referral at this time. Patient stated that she is signing up for Medicare and once that goes through she will call the office. Explained to the patient to call the office once she is ready.   Referral closed and message sent to Dr Quincy Simmonds

## 2022-06-20 NOTE — Telephone Encounter (Signed)
Phone call to patient.  Left message on her voice mail to return my call.   Patient received call from GYN [facility] to schedule an appointment for CIN III.  She declined as she is applying for Medicare.  The referral was closed and she was invited to call back in the future.   Patient shared with me that she is applying for Medicare, and waiting for part A?  I would favor her scheduling an appointment with GYN ONC and pushing it forward slightly but not delaying care too long due to her diagnosis, which will require treatment.

## 2022-06-27 NOTE — Telephone Encounter (Signed)
LDVM per DPR of Dr. Amelia Jo recommendations and expressed wishes for her to call us back with her plans.

## 2022-06-30 NOTE — Telephone Encounter (Signed)
I recommend sending the patient a letter recommending she follow up with the GYN Oncology team for evaluation and treatment of severe cervical dysplasia.   She is at risk for developing cervical cancer, and I do not recommend that she delays her care.

## 2022-07-01 NOTE — Telephone Encounter (Signed)
Letter pended.  Copy to Dr. Silva to review.  

## 2022-07-02 NOTE — Telephone Encounter (Signed)
Letter reviewed and signed by Dr. Edward Jolly.  Mailed to address on file.   Routing to provider for final review. Patient is agreeable to disposition. Will close encounter.

## 2022-07-13 IMAGING — CT CT ABD-PELV W/O CM
2 of 4 series · 16 of 46 positions shown, 18 images · non-contrast
Comparison: CT abdomen pelvis, 09/11/2020

CLINICAL DATA: Abdominal distension, history of gastric bypass

EXAM:
CT ABDOMEN AND PELVIS WITHOUT CONTRAST
TECHNIQUE: Multidetector CT imaging of the abdomen and pelvis was performed
following the standard protocol without IV contrast. Oral enteric
contrast was administered.

[Series 2: axial st · axial · 0.98mm/px · z∈[-934,-529]mm · 13 of 91 slices shown, 15 images]
[im 5/91  soft-tissue]
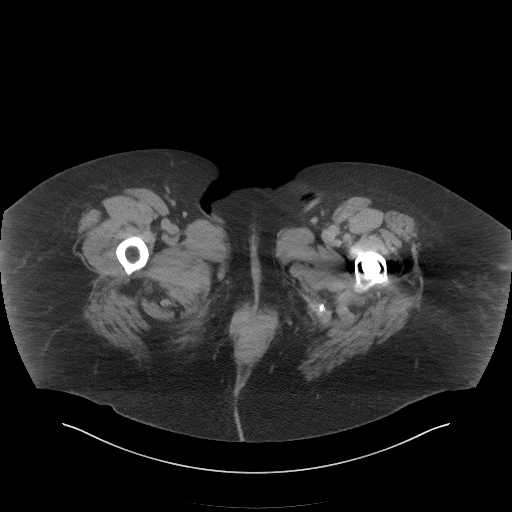
[im 5/91  bone]
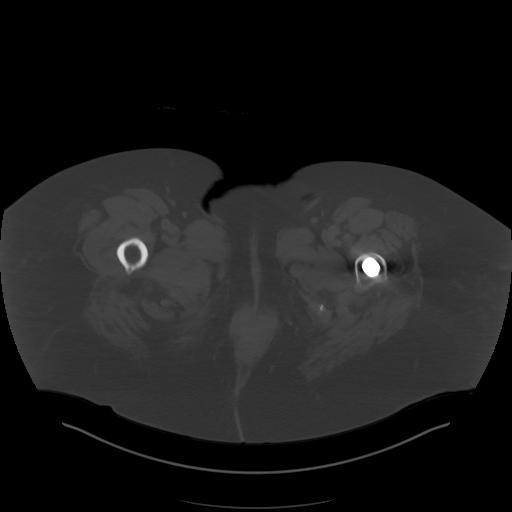
[im 15/91  soft-tissue]
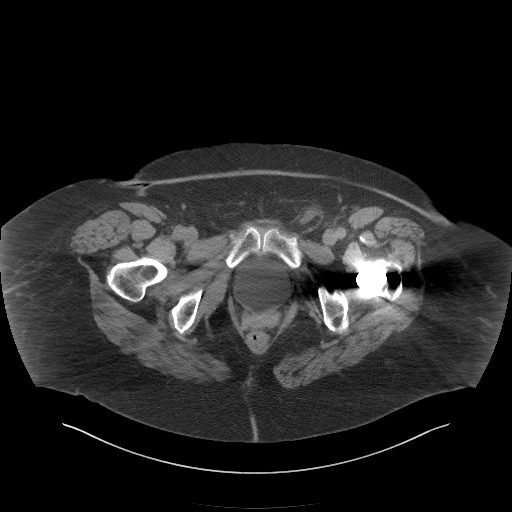
[im 19/91  soft-tissue]
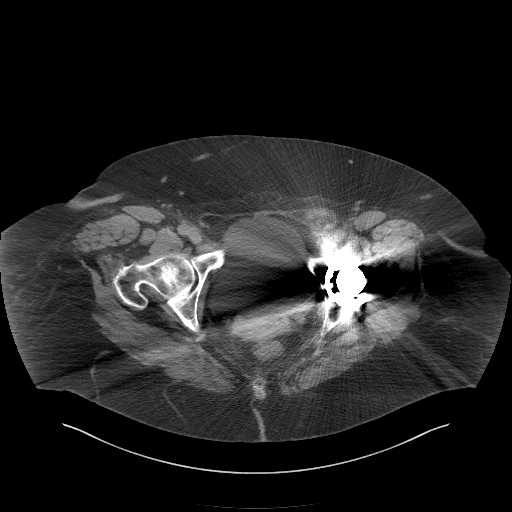
[im 24/91  soft-tissue]
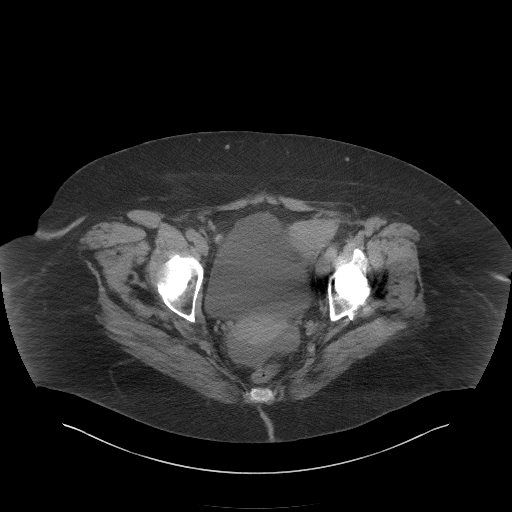
[im 34/91  soft-tissue]
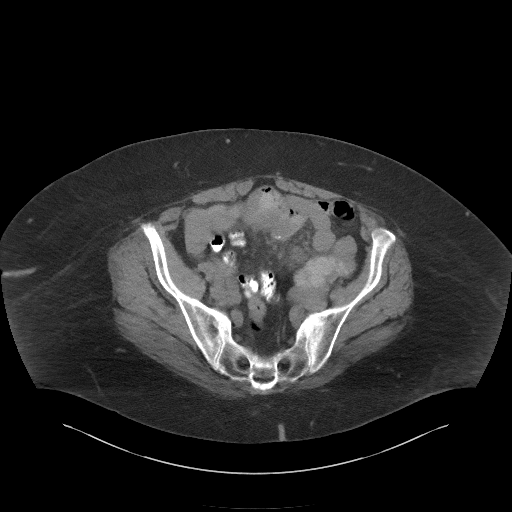
[im 38/91  soft-tissue]
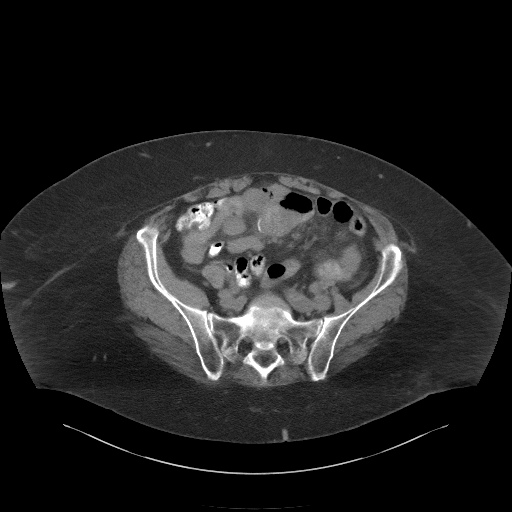
[im 48/91  soft-tissue]
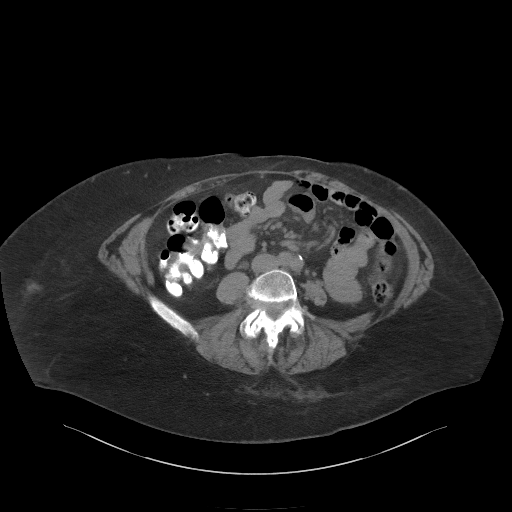
[im 53/91  soft-tissue]
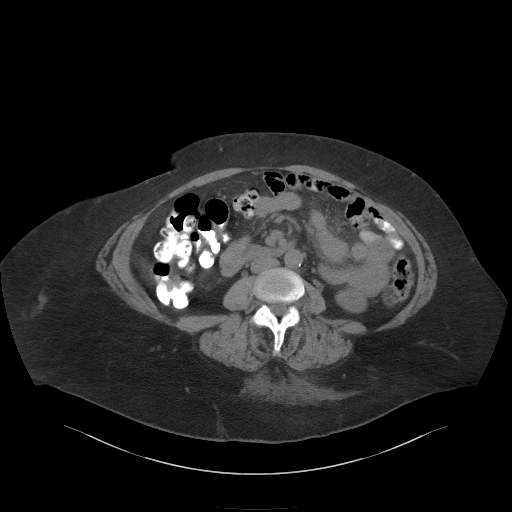
[im 57/91  soft-tissue]
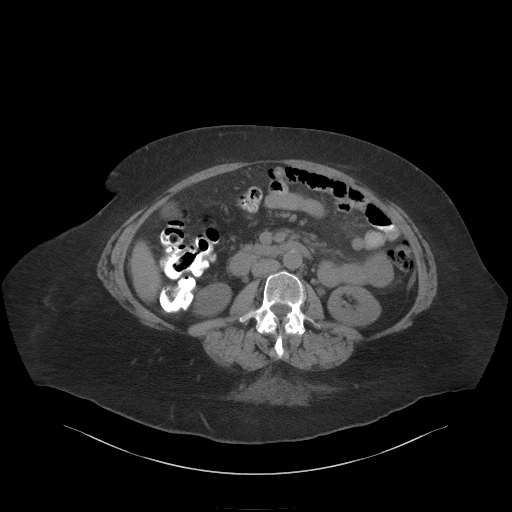
[im 57/91  bone]
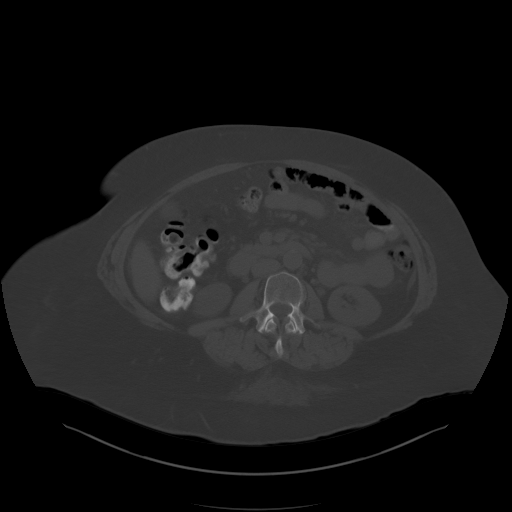
[im 67/91  soft-tissue]
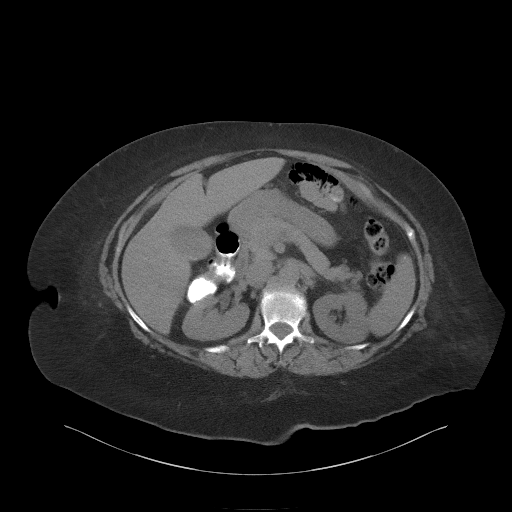
[im 72/91  soft-tissue]
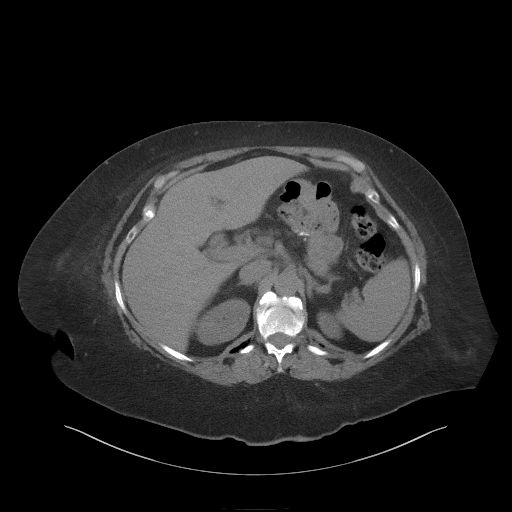
[im 76/91  soft-tissue]
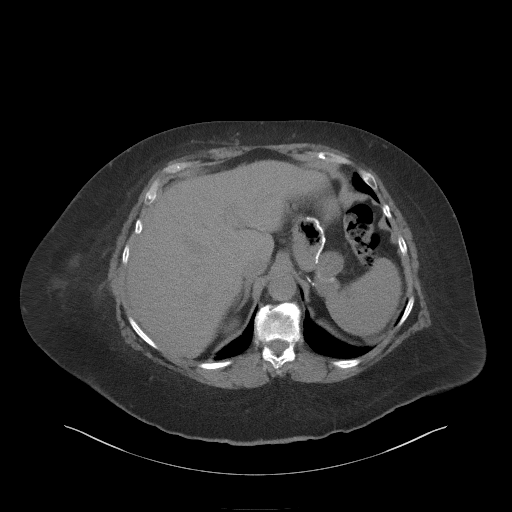
[im 86/91  soft-tissue]
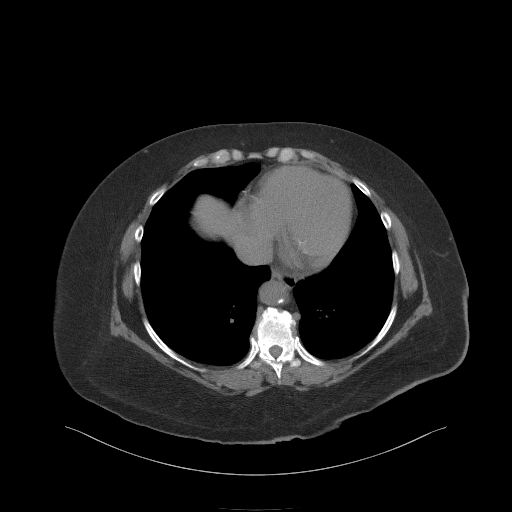

[Series 4: coronal st · coronal · 0.90mm/px · 3 of 108 slices shown]
[im 36/108  soft-tissue]
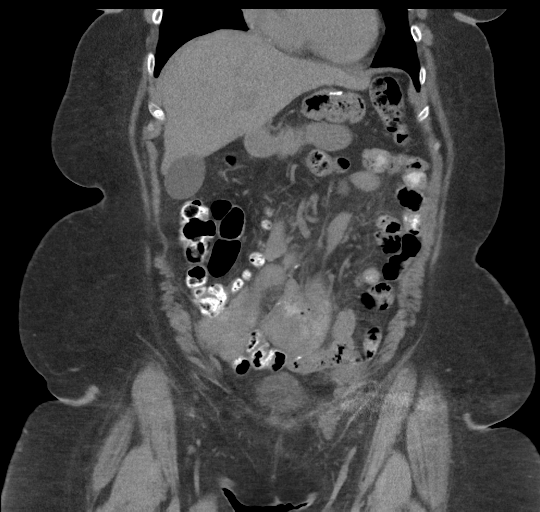
[im 48/108  soft-tissue]
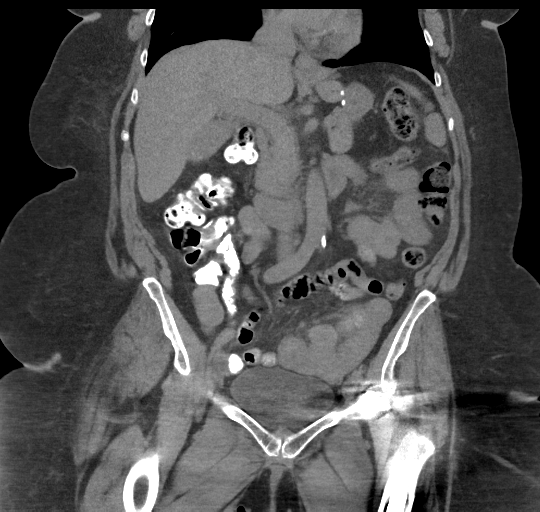
[im 60/108  soft-tissue]
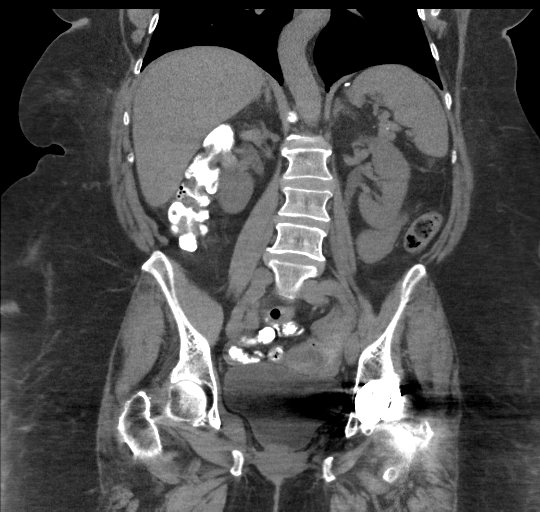

[16 of 46 positions shown; findings below may reference images not displayed]

FINDINGS: Lower chest: No acute abnormality.

Hepatobiliary: No solid liver abnormality is seen. Tiny gallstones
and/or sludge in the dependent gallbladder (series 2, image 25). No
gallbladder wall thickening, or biliary dilatation.

Pancreas: Unremarkable. No pancreatic ductal dilatation or
surrounding inflammatory changes.

Spleen: Normal in size without significant abnormality.

Adrenals/Urinary Tract: Adrenal glands are unremarkable. Kidneys are
normal, without renal calculi, solid lesion, or hydronephrosis.
Bladder is unremarkable.

Stomach/Bowel: Redemonstrated postoperative findings of Roux-en-Y
gastric bypass. A previously identified small bowel small bowel
intussusception in the vicinity of the Roux limb anastomosis is
resolved. Appendix appears normal. No evidence of bowel wall
thickening, distention, or inflammatory changes.

Vascular/Lymphatic: Aortic atherosclerosis. No enlarged abdominal or
pelvic lymph nodes.

Reproductive: No mass or other significant abnormality.

Other: No abdominal wall hernia or abnormality. Small volume free
fluid in the low pelvis.

Musculoskeletal: No acute or significant osseous findings.
IMPRESSION: 1. Redemonstrated postoperative findings of Roux-en-Y gastric
bypass. A previously identified small bowel intussusception in the
vicinity of the Roux limb anastomosis is resolved. No evidence of
bowel obstruction.
2. Small volume free fluid in the low pelvis, nonspecific.
3. Tiny gallstones and/or sludge in the dependent gallbladder. No
evidence of acute cholecystitis.

Aortic Atherosclerosis (DWMZX-TA0.0).

## 2022-07-15 ENCOUNTER — Telehealth: Payer: Self-pay | Admitting: Surgery

## 2022-07-15 NOTE — Telephone Encounter (Signed)
Patient called stating she has completed her medicare application process and medicare will be active as of May 1st so she is ready to scheduled her new patient appointment at this time. Scheduled for May 6th at 10:45am with Dr Alvester Morin. Reviewed location and appointment information with patient. She had no other questions or concerns at this time.

## 2022-07-21 DIAGNOSIS — G4733 Obstructive sleep apnea (adult) (pediatric): Secondary | ICD-10-CM | POA: Diagnosis not present

## 2022-07-25 ENCOUNTER — Encounter: Payer: Self-pay | Admitting: Psychiatry

## 2022-07-29 ENCOUNTER — Inpatient Hospital Stay (HOSPITAL_BASED_OUTPATIENT_CLINIC_OR_DEPARTMENT_OTHER): Payer: Medicare HMO | Admitting: Gynecologic Oncology

## 2022-07-29 ENCOUNTER — Encounter: Payer: Self-pay | Admitting: Psychiatry

## 2022-07-29 ENCOUNTER — Inpatient Hospital Stay: Payer: Medicare HMO | Attending: Psychiatry | Admitting: Psychiatry

## 2022-07-29 VITALS — BP 125/80 | HR 85 | Temp 98.8°F | Resp 16 | Ht 63.6 in | Wt 243.3 lb

## 2022-07-29 DIAGNOSIS — Z7982 Long term (current) use of aspirin: Secondary | ICD-10-CM | POA: Diagnosis not present

## 2022-07-29 DIAGNOSIS — Z79899 Other long term (current) drug therapy: Secondary | ICD-10-CM | POA: Diagnosis not present

## 2022-07-29 DIAGNOSIS — J4520 Mild intermittent asthma, uncomplicated: Secondary | ICD-10-CM | POA: Insufficient documentation

## 2022-07-29 DIAGNOSIS — D069 Carcinoma in situ of cervix, unspecified: Secondary | ICD-10-CM

## 2022-07-29 DIAGNOSIS — F329 Major depressive disorder, single episode, unspecified: Secondary | ICD-10-CM | POA: Insufficient documentation

## 2022-07-29 DIAGNOSIS — Z9889 Other specified postprocedural states: Secondary | ICD-10-CM | POA: Insufficient documentation

## 2022-07-29 DIAGNOSIS — I872 Venous insufficiency (chronic) (peripheral): Secondary | ICD-10-CM | POA: Insufficient documentation

## 2022-07-29 DIAGNOSIS — M1990 Unspecified osteoarthritis, unspecified site: Secondary | ICD-10-CM | POA: Insufficient documentation

## 2022-07-29 MED ORDER — TRAMADOL HCL 50 MG PO TABS: 50.0000 mg | ORAL_TABLET | Freq: Four times a day (QID) | ORAL | 0 refills | Status: DC | PRN

## 2022-07-29 MED ORDER — SENNOSIDES-DOCUSATE SODIUM 8.6-50 MG PO TABS: 2.0000 | ORAL_TABLET | Freq: Every day | ORAL | 0 refills | Status: DC

## 2022-07-29 NOTE — H&P (View-Only) (Signed)
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  The nature of cervical dysplasia was reviewed with the patient. Recommended treatment in a postmenopausal woman is excisional procedure. Based on cervix with tethering to vaginal mucosa, pt may not be able to undergo any additional excisional procedures following this one. Also, risk of cutting through dysplasia to avoid injury posteriorly. Biopsy obtained today at cervico-vaginal junction to help guide if this is high grade dysplasia extending to this area.   Otherwise pt counseling on proceeding with CKC to treat dysplasia as well as for diagnostic purposes to rule out invasive cancer.  Patient was consented for: cold knife conization, endocervical curettage on 08/06/22.  The risks of surgery were discussed in detail and she understands these to including but not limited to bleeding requiring a blood transfusion, infection, injury to adjacent organs (including but not limited to the bowels, bladder, ureters, nerves, blood vessels), unforseen complication, and possible need for re-exploration.  If the patient experiences any of these events, she understands that her hospitalization or recovery may be prolonged and that she may need to take additional medications for a prolonged period. The patient will receive DVT and antibiotic prophylaxis as indicated. She voiced a clear understanding. She had the opportunity to ask questions and informed consent was obtained today. She wishes to proceed.  She does not require preoperative clearance. Her METs are >4.  All preoperative instructions were reviewed. Postoperative expectations were also reviewed.    A copy of this note was sent to the patient's referring provider.  [name], MD Gynecologic Oncology   Medical Decision Making I personally spent   TOTAL 50 minutes face-to-face and non-face-to-face in the care of this patient, which includes all pre, intra, and post visit time on the date of service.  ------------  CC: CIN3  HISTORY OF PRESENT ILLNESS:  Jacqueline Hawkins is a 67 y.o. woman who is seen in consultation at the request of Janean Sark, MD for evaluation of CIN3.  Patient underwent a Pap smear on 01/16/2022 which showed normal cells but high risk positive HPV.  Repeat Pap on 04/30/2022 showed H SIL and positive high risk HPV.  Colposcopy on 04/30/2022 showed CIN-3 on endocervical curettage, as well as CIN-3 at the 3 and 9:00 biopsies, suspicious at the 6:00 biopsy.  At the time of that exam, she was described to have increased vascularity circumferentially of the cervix.  Otherwise, her pap smear history is as follows: - 12/29/2007: NILM - 01/02/2009: NILM - 07/18/2009: Cervical polyp: Benign endocervical polyp with chronic cervicitis - 07/04/2011: NILM - 06/19/2015: NILM, HR HPV positive - 10/21/2016: NILM, HR HPV negative - 01/16/2022: NILM, HR HPV positive - 04/30/2022: H SIL, HR HPV positive - 04/30/2022: Colposcopy with ECC CIN-3, 6:00 biopsy suspicious for squamous intraepithelial lesion, 9:00 biopsy CIN-3, 3:00 biopsy CIN-3  Today she denies any bleeding or spotting. She notes that she has not been sexually active in the past 14 years. She denies abdominal bloating, early satiety, significant weight loss, change in bowel or bladder habits.      PAST MEDICAL HISTORY: Past Medical History:  Diagnosis Date   Asthma, mild intermittent    followed by pcp   CIN III (cervical intraepithelial neoplasia III)    History of diabetes mellitus    AIC's normal since gastric bypass   Hyperlipidemia  Iron deficiency anemia    MDD (major depressive disorder)    OA (osteoarthritis)    knees   OSA on CPAP 1995   (08-01-2022  per pt using cpap nightly)   followed by dr Wynona Neat (pulmonary);  first dx 1995 used cpap , stopped  after gastric bypass;  retested 09-04-2019  in epic , severe osa   Venous insufficiency    Wears glasses     PAST SURGICAL HISTORY: Past Surgical History:  Procedure Laterality Date   COLONOSCOPY WITH ESOPHAGOGASTRODUODENOSCOPY (EGD)  07/21/2015   dr Adela Lank   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 Daphine Deutscher:    via laparoscopy   TOTAL HIP ARTHROPLASTY Left 1999    OB/GYN HISTORY: OB History  Gravida Para Term Preterm AB Living  2 2 2     2   SAB IAB Ectopic Multiple Live Births          2    # Outcome Date GA Lbr Len/2nd Weight Sex Delivery Anes PTL Lv  2 Term      Vag-Spont   LIV  1 Term      Vag-Spont   LIV      Age at menarche: 8 Age at menopause: 54 Hx of HRT: none Hx of STI: HPV Last pap: see above History of abnormal pap smears: see above  SCREENING STUDIES:  Last mammogram: 10/2021 Last colonoscopy: 2017 (f/u 10 year)  MEDICATIONS:  Current Outpatient Medications:    aspirin 81 MG tablet, Take 1 tablet (81 mg total) by mouth daily. (Patient taking differently: Take 81 mg by mouth at bedtime.), Disp: 90 tablet, Rfl: 3   atorvastatin (LIPITOR) 10 MG tablet, Take 1 tablet (10 mg total) by mouth daily., Disp: 90 tablet, Rfl: 3   buPROPion (WELLBUTRIN) 100 MG tablet, TAKE 3 TABLETS BY MOUTH DAILY (Patient taking differently: Take 300 mg by mouth daily.), Disp: 270 tablet, Rfl: 2   Calcium-Vitamin D-Vitamin K (CALCIUM SOFT CHEWS) 500-500-40 MG-UNT-MCG CHEW, Chew by mouth 2 (two) times daily. 650mg  calcium. 12.5 mcg D vitamin k vitamin. Eat 2 per day  (morning and lunch), Disp: , Rfl:    cetirizine (ZYRTEC) 10 MG tablet, Take 10 mg by mouth at bedtime., Disp: , Rfl:    Cyanocobalamin 1000 MCG SUBL, Place 1 tablet (1,000 mcg total) under the tongue daily. (Patient taking differently: Place 1 tablet under the tongue 3 (three) times a week. M/W/F), Disp: 90 tablet, Rfl: 3   escitalopram (LEXAPRO) 20 MG tablet, Take 1 tablet (20 mg total) by mouth daily.  (Patient taking differently: Take 20 mg by mouth at bedtime.), Disp: 90 tablet, Rfl: 3   FeFum-FePoly-FA-B Cmp-C-Biot (FOLIVANE-PLUS) CAPS, TAKE 1 CAPSULE BY MOUTH EVERY DAY (Patient taking differently: Take 1 capsule by mouth daily after breakfast.), Disp: 90 capsule, Rfl: 3   levalbuterol (XOPENEX HFA) 45 MCG/ACT inhaler, Inhale 2 puffs into the lungs every 6 (six) hours as needed for wheezing., Disp: 1 each, Rfl: 3   Multiple Vitamins-Minerals (MULTIVITAMIN & MINERAL PO), Take 1 capsule by mouth 3 (three) times daily., Disp: , Rfl:    Omega-3 Fatty Acids (FISH OIL) 1000 MG CAPS, Take 1 capsule by mouth 2 (two) times daily., Disp: , Rfl:    Probiotic Product (PROBIOTIC PO), Take 1 capsule by mouth daily. 4 billion live per capsule 1 billion CFU, Disp: , Rfl:    senna-docusate (SENOKOT-S) 8.6-50 MG tablet, Take 2 tablets by mouth at bedtime. For AFTER surgery, do  not take if having diarrhea (Patient not taking: Reported on 08/01/2022), Disp: 30 tablet, Rfl: 0   traMADol (ULTRAM) 50 MG tablet, Take 1 tablet (50 mg total) by mouth every 6 (six) hours as needed for severe pain. For AFTER surgery only, do not take and drive (Patient not taking: Reported on 08/01/2022), Disp: 5 tablet, Rfl: 0   vitamin C (ASCORBIC ACID) 500 MG tablet, Take 500 mg by mouth daily with lunch., Disp: , Rfl:   ALLERGIES: No Known Allergies  FAMILY HISTORY: Family History  Problem Relation Age of Onset   Thyroid disease Mother    Diabetes Father    Heart disease Father    Breast cancer Maternal Aunt    Stroke Cousin    Colon cancer Neg Hx    Ovarian cancer Neg Hx    Endometrial cancer Neg Hx    Pancreatic cancer Neg Hx    Prostate cancer Neg Hx     SOCIAL HISTORY: Social History   Socioeconomic History   Marital status: Divorced    Spouse name: Not on file   Number of children: 2   Years of education: Not on file   Highest education level: Associate degree: occupational, Scientist, product/process development, or vocational program   Occupational History   Occupation: Counsellor    Comment: [employer]  Tobacco Use   Smoking status: Former    Years: 2    Types: Cigarettes    Quit date: 1977    Years since quitting: 47.3   Smokeless tobacco: Never  Vaping Use   Vaping Use: Never used  Substance and Sexual Activity   Alcohol use: Yes    Alcohol/week: 7.0 standard drinks of alcohol    Types: 7 Cans of beer per week    Comment: 08-01-2022  per pt one beer per evening   Drug use: Never   Sexual activity: Not Currently  Other Topics Concern   Not on file  Social History Narrative   Not on file   Social Determinants of Health   Financial Resource Strain: Low Risk  (01/15/2022)   Overall Financial Resource Strain (CARDIA)    Difficulty of Paying Living Expenses: Not hard at all  Food Insecurity: No Food Insecurity (01/15/2022)   Hunger Vital Sign    Worried About Running Out of Food in the Last Year: Never true    Ran Out of Food in the Last Year: Never true  Transportation Needs: No Transportation Needs (01/15/2022)   PRAPARE - Administrator, Civil Service (Medical): No    Lack of Transportation (Non-Medical): No  Physical Activity: Sufficiently Active (01/15/2022)   Exercise Vital Sign    Days of Exercise per Week: 5 days    Minutes of Exercise per Session: 30 min  Stress: No Stress Concern Present (01/15/2022)   [employer] of Occupational Health - Occupational Stress Questionnaire    Feeling of Stress : Not at all  Social Connections: Moderately Integrated (01/15/2022)   Social Connection and Isolation Panel [NHANES]    Frequency of Communication with Friends and Family: More than three times a week    Frequency of Social Gatherings with Friends and Family: Not on file    Attends Religious Services: More than 4 times per year    Active Member of [employer] or Organizations: Yes    Attends Engineer, structural: More than 4 times per year    Marital  Status: Divorced  Catering manager Violence: Not on file  REVIEW OF SYSTEMS: New patient intake form was reviewed.  Complete 10-system review is negative except for the following: joint pain, incontinence  PHYSICAL EXAM: BP 125/80 (BP Location: Right Arm, Patient Position: Sitting)   Pulse 85   Temp 98.8 F (37.1 C)   Resp 16   Ht 5' 3.6" (1.615 m)   Wt 243 lb 4.8 oz (110.4 kg)   LMP 06/11/2010   SpO2 95%   BMI 42.29 kg/m  Constitutional: No acute distress. Neuro/Psych: Alert, oriented.  Head and Neck: Normocephalic, atraumatic. Neck symmetric without masses. Sclera anicteric.  Respiratory: Normal work of breathing. Clear to auscultation bilaterally. Cardiovascular: Regular rate and rhythm, no murmurs, rubs, or gallops. Abdomen: Normoactive bowel sounds. Soft, non-distended, non-tender to palpation. No masses  appreciated. Extremities: Grossly normal range of motion. Warm, well perfused. No edema bilaterally. Skin: No rashes or lesions. Lymphatic: No cervical, supraclavicular, or inguinal adenopathy. Genitourinary: External genitalia without lesions. Urethral meatus without lesions or prolapse. On speculum exam, vagina without lesions. Cervix with tethering to the vaginal mucosa posteriorly at 5 o'clock. Hyperglandular appearance of ectocervix. Bimanual exam confirms tethering of cervix to posterior vaginal mucosa. Some firmness of ectocervix. Rectovaginal exam confirms the above findings and reveals normal sphincter tone and no masses or nodularity. Exam chaperoned by Warner Mccreedy, NP  COLPOSCOPY PROCEDURE NOTE  Procedure Details: After appropriate verbal informed consent was obtained, a timeout was performed. A sterile speculum was placed in the vagina. Acetic acid was applied to the cervix with the findings as noted below. The cervix was then cleaned with betadine x3. A single-tooth tenaculum was placed on the anterior lip of the cervix. Using tischler biopsy forceps, a biopsy  was obtained of the cervico-vaginal junction at 5 o'clock. Silver nitrite was applied with hemostasis achieved. The tenaculum was removed, and tenaculum sites were noted to be hemostatic. The speculum was removed from the vagina. The patient tolerated the procedure well.   Adequate Exam: yes  Biopsy Specimen: 5 o'clock at cervico-vaginal junction  Condition: Stable. Patient tolerated procedure well.  Complications: None  Findings: Glandular red ectocervix with microvascular changes. No significant acetowhite changes. Tethering of the cervix to the vaginal mucosa at 5 o'clock with cervical changes extending to and abutting the vaginal mucosa at this location. Biopsy obtained here.    Colposcopic Impression: CIN3   LABORATORY AND RADIOLOGIC DATA: Outside medical records were reviewed to synthesize the above history, along with the history and physical obtained during the visit.  Outside laboratory, pathology reports were reviewed, with pertinent results below.   WBC  Date Value Ref Range Status  12/07/2021 3.7 (L) 4.0 - 10.5 K/uL Final   Hemoglobin  Date Value Ref Range Status  12/07/2021 13.8 12.0 - 15.0 g/dL Final   HCT  Date Value Ref Range Status  12/07/2021 40.6 36.0 - 46.0 % Final   Platelets  Date Value Ref Range Status  12/07/2021 207.0 150.0 - 400.0 K/uL Final   Creat  Date Value Ref Range Status  10/27/2019 0.68 0.50 - 0.99 mg/dL Final    Comment:    For patients >65 years of age, the reference limit for Creatinine is approximately 13% higher for people identified as African-American. .    Creatinine, Ser  Date Value Ref Range Status  12/07/2021 0.69 0.40 - 1.20 mg/dL Final   AST  Date Value Ref Range Status  12/07/2021 24 0 - 37 U/L Final   ALT  Date Value Ref Range Status  12/07/2021 24 0 - 35  U/L Final   Diagnosis  Date Value Ref Range Status  04/30/2022 (A)  Final   - High grade squamous intraepithelial lesion (HSIL)  01/16/2022   Final   -  Negative for intraepithelial lesion or malignancy (NILM)  10/21/2016   Final   NEGATIVE FOR INTRAEPITHELIAL LESIONS OR MALIGNANCY.   HPV  Date Value Ref Range Status  10/21/2016 NOT DETECTED  Final    Comment:    Normal Reference Range - NOT Detected    See pap smear results above.

## 2022-07-29 NOTE — Progress Notes (Signed)
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  The nature of cervical dysplasia was reviewed with the patient. Recommended treatment in a postmenopausal woman is excisional procedure. Based on cervix with tethering to vaginal mucosa, pt may not be able to undergo any additional excisional procedures following this one. Also, risk of cutting through dysplasia to avoid injury posteriorly. Biopsy obtained today at cervico-vaginal junction to help guide if this is high grade dysplasia extending to this area.   Otherwise pt counseling on proceeding with CKC to treat dysplasia as well as for diagnostic purposes to rule out invasive cancer.  Patient was consented for: cold knife conization, endocervical curettage on 08/06/22.  The risks of surgery were discussed in detail and she understands these to including but not limited to bleeding requiring a blood transfusion, infection, injury to adjacent organs (including but not limited to the bowels, bladder, ureters, nerves, blood vessels), unforseen complication, and possible need for re-exploration.  If the patient experiences any of these events, she understands that her hospitalization or recovery may be prolonged and that she may need to take additional medications for a prolonged period. The patient will receive DVT and antibiotic prophylaxis as indicated. She voiced a clear understanding. She had the opportunity to ask questions and informed consent was obtained today. She wishes to proceed.  She does not require preoperative clearance. Her METs are >4.  All preoperative instructions were reviewed. Postoperative expectations were also reviewed.    A copy of this note was sent to the patient's referring provider.  [name], MD Gynecologic Oncology   Medical Decision Making I personally spent   TOTAL 50 minutes face-to-face and non-face-to-face in the care of this patient, which includes all pre, intra, and post visit time on the date of service.  ------------  CC: CIN3  HISTORY OF PRESENT ILLNESS:  Jacqueline Hawkins is a 67 y.o. woman who is seen in consultation at the request of Janean Sark, MD for evaluation of CIN3.  Patient underwent a Pap smear on 01/16/2022 which showed normal cells but high risk positive HPV.  Repeat Pap on 04/30/2022 showed H SIL and positive high risk HPV.  Colposcopy on 04/30/2022 showed CIN-3 on endocervical curettage, as well as CIN-3 at the 3 and 9:00 biopsies, suspicious at the 6:00 biopsy.  At the time of that exam, she was described to have increased vascularity circumferentially of the cervix.  Otherwise, her pap smear history is as follows: - 12/29/2007: NILM - 01/02/2009: NILM - 07/18/2009: Cervical polyp: Benign endocervical polyp with chronic cervicitis - 07/04/2011: NILM - 06/19/2015: NILM, HR HPV positive - 10/21/2016: NILM, HR HPV negative - 01/16/2022: NILM, HR HPV positive - 04/30/2022: H SIL, HR HPV positive - 04/30/2022: Colposcopy with ECC CIN-3, 6:00 biopsy suspicious for squamous intraepithelial lesion, 9:00 biopsy CIN-3, 3:00 biopsy CIN-3  Today she denies any bleeding or spotting. She notes that she has not been sexually active in the past 14 years. She denies abdominal bloating, early satiety, significant weight loss, change in bowel or bladder habits.      PAST MEDICAL HISTORY: Past Medical History:  Diagnosis Date   Asthma, mild intermittent    followed by pcp   CIN III (cervical intraepithelial neoplasia III)    History of diabetes mellitus    AIC's normal since gastric bypass   Hyperlipidemia  Iron deficiency anemia    MDD (major depressive disorder)    OA (osteoarthritis)    knees   OSA on CPAP 1995   (08-01-2022  per pt using cpap nightly)   followed by dr Wynona Neat (pulmonary);  first dx 1995 used cpap , stopped  after gastric bypass;  retested 09-04-2019  in epic , severe osa   Venous insufficiency    Wears glasses     PAST SURGICAL HISTORY: Past Surgical History:  Procedure Laterality Date   COLONOSCOPY WITH ESOPHAGOGASTRODUODENOSCOPY (EGD)  07/21/2015   dr Adela Lank   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 Daphine Deutscher:    via laparoscopy   TOTAL HIP ARTHROPLASTY Left 1999    OB/GYN HISTORY: OB History  Gravida Para Term Preterm AB Living  2 2 2     2   SAB IAB Ectopic Multiple Live Births          2    # Outcome Date GA Lbr Len/2nd Weight Sex Delivery Anes PTL Lv  2 Term      Vag-Spont   LIV  1 Term      Vag-Spont   LIV      Age at menarche: 8 Age at menopause: 54 Hx of HRT: none Hx of STI: HPV Last pap: see above History of abnormal pap smears: see above  SCREENING STUDIES:  Last mammogram: 10/2021 Last colonoscopy: 2017 (f/u 10 year)  MEDICATIONS:  Current Outpatient Medications:    aspirin 81 MG tablet, Take 1 tablet (81 mg total) by mouth daily. (Patient taking differently: Take 81 mg by mouth at bedtime.), Disp: 90 tablet, Rfl: 3   atorvastatin (LIPITOR) 10 MG tablet, Take 1 tablet (10 mg total) by mouth daily., Disp: 90 tablet, Rfl: 3   buPROPion (WELLBUTRIN) 100 MG tablet, TAKE 3 TABLETS BY MOUTH DAILY (Patient taking differently: Take 300 mg by mouth daily.), Disp: 270 tablet, Rfl: 2   Calcium-Vitamin D-Vitamin K (CALCIUM SOFT CHEWS) 500-500-40 MG-UNT-MCG CHEW, Chew by mouth 2 (two) times daily. 650mg  calcium. 12.5 mcg D vitamin k vitamin. Eat 2 per day  (morning and lunch), Disp: , Rfl:    cetirizine (ZYRTEC) 10 MG tablet, Take 10 mg by mouth at bedtime., Disp: , Rfl:    Cyanocobalamin 1000 MCG SUBL, Place 1 tablet (1,000 mcg total) under the tongue daily. (Patient taking differently: Place 1 tablet under the tongue 3 (three) times a week. M/W/F), Disp: 90 tablet, Rfl: 3   escitalopram (LEXAPRO) 20 MG tablet, Take 1 tablet (20 mg total) by mouth daily.  (Patient taking differently: Take 20 mg by mouth at bedtime.), Disp: 90 tablet, Rfl: 3   FeFum-FePoly-FA-B Cmp-C-Biot (FOLIVANE-PLUS) CAPS, TAKE 1 CAPSULE BY MOUTH EVERY DAY (Patient taking differently: Take 1 capsule by mouth daily after breakfast.), Disp: 90 capsule, Rfl: 3   levalbuterol (XOPENEX HFA) 45 MCG/ACT inhaler, Inhale 2 puffs into the lungs every 6 (six) hours as needed for wheezing., Disp: 1 each, Rfl: 3   Multiple Vitamins-Minerals (MULTIVITAMIN & MINERAL PO), Take 1 capsule by mouth 3 (three) times daily., Disp: , Rfl:    Omega-3 Fatty Acids (FISH OIL) 1000 MG CAPS, Take 1 capsule by mouth 2 (two) times daily., Disp: , Rfl:    Probiotic Product (PROBIOTIC PO), Take 1 capsule by mouth daily. 4 billion live per capsule 1 billion CFU, Disp: , Rfl:    senna-docusate (SENOKOT-S) 8.6-50 MG tablet, Take 2 tablets by mouth at bedtime. For AFTER surgery, do  not take if having diarrhea (Patient not taking: Reported on 08/01/2022), Disp: 30 tablet, Rfl: 0   traMADol (ULTRAM) 50 MG tablet, Take 1 tablet (50 mg total) by mouth every 6 (six) hours as needed for severe pain. For AFTER surgery only, do not take and drive (Patient not taking: Reported on 08/01/2022), Disp: 5 tablet, Rfl: 0   vitamin C (ASCORBIC ACID) 500 MG tablet, Take 500 mg by mouth daily with lunch., Disp: , Rfl:   ALLERGIES: No Known Allergies  FAMILY HISTORY: Family History  Problem Relation Age of Onset   Thyroid disease Mother    Diabetes Father    Heart disease Father    Breast cancer Maternal Aunt    Stroke Cousin    Colon cancer Neg Hx    Ovarian cancer Neg Hx    Endometrial cancer Neg Hx    Pancreatic cancer Neg Hx    Prostate cancer Neg Hx     SOCIAL HISTORY: Social History   Socioeconomic History   Marital status: Divorced    Spouse name: Not on file   Number of children: 2   Years of education: Not on file   Highest education level: Associate degree: occupational, Scientist, product/process development, or vocational program   Occupational History   Occupation: Counsellor    Comment: [employer]  Tobacco Use   Smoking status: Former    Years: 2    Types: Cigarettes    Quit date: 1977    Years since quitting: 47.3   Smokeless tobacco: Never  Vaping Use   Vaping Use: Never used  Substance and Sexual Activity   Alcohol use: Yes    Alcohol/week: 7.0 standard drinks of alcohol    Types: 7 Cans of beer per week    Comment: 08-01-2022  per pt one beer per evening   Drug use: Never   Sexual activity: Not Currently  Other Topics Concern   Not on file  Social History Narrative   Not on file   Social Determinants of Health   Financial Resource Strain: Low Risk  (01/15/2022)   Overall Financial Resource Strain (CARDIA)    Difficulty of Paying Living Expenses: Not hard at all  Food Insecurity: No Food Insecurity (01/15/2022)   Hunger Vital Sign    Worried About Running Out of Food in the Last Year: Never true    Ran Out of Food in the Last Year: Never true  Transportation Needs: No Transportation Needs (01/15/2022)   PRAPARE - Administrator, Civil Service (Medical): No    Lack of Transportation (Non-Medical): No  Physical Activity: Sufficiently Active (01/15/2022)   Exercise Vital Sign    Days of Exercise per Week: 5 days    Minutes of Exercise per Session: 30 min  Stress: No Stress Concern Present (01/15/2022)   [employer] of Occupational Health - Occupational Stress Questionnaire    Feeling of Stress : Not at all  Social Connections: Moderately Integrated (01/15/2022)   Social Connection and Isolation Panel [NHANES]    Frequency of Communication with Friends and Family: More than three times a week    Frequency of Social Gatherings with Friends and Family: Not on file    Attends Religious Services: More than 4 times per year    Active Member of [employer] or Organizations: Yes    Attends Engineer, structural: More than 4 times per year    Marital  Status: Divorced  Catering manager Violence: Not on file  REVIEW OF SYSTEMS: New patient intake form was reviewed.  Complete 10-system review is negative except for the following: joint pain, incontinence  PHYSICAL EXAM: BP 125/80 (BP Location: Right Arm, Patient Position: Sitting)   Pulse 85   Temp 98.8 F (37.1 C)   Resp 16   Ht 5' 3.6" (1.615 m)   Wt 243 lb 4.8 oz (110.4 kg)   LMP 06/11/2010   SpO2 95%   BMI 42.29 kg/m  Constitutional: No acute distress. Neuro/Psych: Alert, oriented.  Head and Neck: Normocephalic, atraumatic. Neck symmetric without masses. Sclera anicteric.  Respiratory: Normal work of breathing. Clear to auscultation bilaterally. Cardiovascular: Regular rate and rhythm, no murmurs, rubs, or gallops. Abdomen: Normoactive bowel sounds. Soft, non-distended, non-tender to palpation. No masses  appreciated. Extremities: Grossly normal range of motion. Warm, well perfused. No edema bilaterally. Skin: No rashes or lesions. Lymphatic: No cervical, supraclavicular, or inguinal adenopathy. Genitourinary: External genitalia without lesions. Urethral meatus without lesions or prolapse. On speculum exam, vagina without lesions. Cervix with tethering to the vaginal mucosa posteriorly at 5 o'clock. Hyperglandular appearance of ectocervix. Bimanual exam confirms tethering of cervix to posterior vaginal mucosa. Some firmness of ectocervix. Rectovaginal exam confirms the above findings and reveals normal sphincter tone and no masses or nodularity. Exam chaperoned by Warner Mccreedy, NP  COLPOSCOPY PROCEDURE NOTE  Procedure Details: After appropriate verbal informed consent was obtained, a timeout was performed. A sterile speculum was placed in the vagina. Acetic acid was applied to the cervix with the findings as noted below. The cervix was then cleaned with betadine x3. A single-tooth tenaculum was placed on the anterior lip of the cervix. Using tischler biopsy forceps, a biopsy  was obtained of the cervico-vaginal junction at 5 o'clock. Silver nitrite was applied with hemostasis achieved. The tenaculum was removed, and tenaculum sites were noted to be hemostatic. The speculum was removed from the vagina. The patient tolerated the procedure well.   Adequate Exam: yes  Biopsy Specimen: 5 o'clock at cervico-vaginal junction  Condition: Stable. Patient tolerated procedure well.  Complications: None  Findings: Glandular red ectocervix with microvascular changes. No significant acetowhite changes. Tethering of the cervix to the vaginal mucosa at 5 o'clock with cervical changes extending to and abutting the vaginal mucosa at this location. Biopsy obtained here.    Colposcopic Impression: CIN3   LABORATORY AND RADIOLOGIC DATA: Outside medical records were reviewed to synthesize the above history, along with the history and physical obtained during the visit.  Outside laboratory, pathology reports were reviewed, with pertinent results below.   WBC  Date Value Ref Range Status  12/07/2021 3.7 (L) 4.0 - 10.5 K/uL Final   Hemoglobin  Date Value Ref Range Status  12/07/2021 13.8 12.0 - 15.0 g/dL Final   HCT  Date Value Ref Range Status  12/07/2021 40.6 36.0 - 46.0 % Final   Platelets  Date Value Ref Range Status  12/07/2021 207.0 150.0 - 400.0 K/uL Final   Creat  Date Value Ref Range Status  10/27/2019 0.68 0.50 - 0.99 mg/dL Final    Comment:    For patients >65 years of age, the reference limit for Creatinine is approximately 13% higher for people identified as African-American. .    Creatinine, Ser  Date Value Ref Range Status  12/07/2021 0.69 0.40 - 1.20 mg/dL Final   AST  Date Value Ref Range Status  12/07/2021 24 0 - 37 U/L Final   ALT  Date Value Ref Range Status  12/07/2021 24 0 - 35  U/L Final   Diagnosis  Date Value Ref Range Status  04/30/2022 (A)  Final   - High grade squamous intraepithelial lesion (HSIL)  01/16/2022   Final   -  Negative for intraepithelial lesion or malignancy (NILM)  10/21/2016   Final   NEGATIVE FOR INTRAEPITHELIAL LESIONS OR MALIGNANCY.   HPV  Date Value Ref Range Status  10/21/2016 NOT DETECTED  Final    Comment:    Normal Reference Range - NOT Detected    See pap smear results above.

## 2022-07-29 NOTE — Patient Instructions (Signed)
You will be notified of the results of your biopsy from today. You may have a brownish discharge or light spotting from the medication used today.  Preparing for your Surgery  Plan for surgery on Aug 06, 2022 with Dr. Meredith Newton at [facility]. You will be scheduled for examination under anesthesia, cold knife conization of the cervix (cone shaped biopsy of the cervix), endocervical curettage (sampling from the inner canal of the cervix).   Pre-operative Testing -You will receive a phone call from presurgical testing at [facility] to discuss surgery instructions and arrange for lab work if needed.  -Bring your insurance card, copy of an advanced directive if applicable, medication list.  -DO NOT TAKE YOUR ASPIRIN THE DAY OF SURGERY.  -Do not take supplements such as fish oil (omega 3), red yeast rice, turmeric before your surgery. You want to avoid medications with aspirin in them including headache powders such as BC or Goody's), Excedrin migraine. STOP FISH OIL NOW.  Day Before Surgery at Home -You will be advised you can have clear liquids up until 3 hours before your surgery.    Your role in recovery Your role is to become active as soon as directed by your doctor, while still giving yourself time to heal.  Rest when you feel tired. You will be asked to do the following in order to speed your recovery:  - Cough and breathe deeply. This helps to clear and expand your lungs and can prevent pneumonia after surgery.  - STAY ACTIVE WHEN YOU GET HOME. Do mild physical activity. Walking or moving your legs help your circulation and body functions return to normal. Do not try to get up or walk alone the first time after surgery.   -If you develop swelling on one leg or the other, pain in the back of your leg, redness/warmth in one of your legs, please call the office or go to the Emergency Room to have a doppler to rule out a blood clot. For shortness  of breath, chest pain-seek care in the Emergency Room as soon as possible. - Actively manage your pain. Managing your pain lets you move in comfort. We will ask you to rate your pain on a scale of zero to 10. It is your responsibility to tell your doctor or nurse where and how much you hurt so your pain can be treated.  Special Considerations -Your final pathology results from surgery should be available around one week after surgery and the results will be relayed to you when available.  -FMLA forms can be faxed to [phone] and please allow 5-7 business days for completion.  Pain Management After Surgery -You will be prescribed your pain medication and bowel regimen medications before surgery so that you can have these available when you are discharged from the hospital. The pain medication is for use ONLY AFTER surgery and a new prescription will not be given.   -Make sure that you have Tylenol and Ibuprofen at home IF YOU ARE ABLE TO TAKE THESE MEDICATIONS to use on a regular basis after surgery for pain control. We recommend alternating the medications every hour to six hours since they work differently and are processed in the body differently for pain relief.  -Review the attached handout on narcotic use and their risks and side effects.   Bowel Regimen -You will be prescribed Sennakot-S to take nightly to prevent constipation especially if you are taking the narcotic pain medication intermittently.    It is important to prevent constipation and drink adequate amounts of liquids. You can stop taking this medication when you are not taking pain medication and you are back on your normal bowel routine.  Risks of Surgery Risks of surgery are low but include bleeding, infection, damage to surrounding structures, re-operation, blood clots, and very rarely death.  AFTER SURGERY INSTRUCTIONS  Return to work:  variable based on occupation  You may notice a piece of gauze like-material come  out of the vagina over the next several days. This is NORMAL and is used during surgery to help stop bleeding at the cervix.   Activity: 1. Be up and out of the bed during the day.  Take a nap if needed.  You may walk up steps but be careful and use the hand rail.  Stair climbing will tire you more than you think, you may need to stop part way and rest.   2. No lifting or straining for 2 weeks minimum over 10 pounds. No pushing, pulling, straining for 2 weeks.  3. No driving for minimum 24 hours after surgery.  Do not drive if you are taking narcotic pain medicine and make sure that your reaction time has returned.   4. You can shower as soon as the next day after surgery. Shower daily. No tub baths or submerging your body in water until cleared by your surgeon. If you have the soap that was given to you by pre-surgical testing that was used before surgery, you do not need to use it afterwards because this can irritate your incisions.   5. No sexual activity and nothing in the vagina for 4-6 weeks.  6. You may experience vaginal spotting and discharge after surgery.  The spotting is normal but if you experience heavy bleeding, call our office.  7. Take Tylenol or ibuprofen first for pain if you are able to take these medications and only use narcotic pain medication for severe pain not relieved by the Tylenol or Ibuprofen.  Monitor your Tylenol intake to a max of 4,000 mg in a 24 hour period. You can alternate these medications after surgery.  Diet: 1. Low sodium Heart Healthy Diet is recommended but you are cleared to resume your normal (before surgery) diet after your procedure.  2. It is safe to use a laxative, such as Miralax or Colace, if you have difficulty moving your bowels. You have been prescribed Sennakot at bedtime every evening to keep bowel movements regular and to prevent constipation.    Wound Care: 1. Keep clean and dry.  Shower daily.  Reasons to call the Doctor: Fever -  Oral temperature greater than 100.4 degrees Fahrenheit Foul-smelling vaginal discharge Difficulty urinating Nausea and vomiting Increased pain at the site of the incision that is unrelieved with pain medicine. Difficulty breathing with or without chest pain New calf pain especially if only on one side Sudden, continuing increased vaginal bleeding with or without clots.   Contacts: For questions or concerns you should contact:  Dr. Meredith Newton at [phone]  Ananda Caya, NP at [phone]  After Hours: call [phone] and have the GYN Oncologist paged/contacted (after 5 pm or on the weekends).  Messages sent via mychart are for non-urgent matters and are not responded to after hours so for urgent needs, please call the after hours number.       please call the after hours number.

## 2022-07-29 NOTE — Patient Instructions (Addendum)
You will be notified of the results of your biopsy from today. You may have a brownish discharge or light spotting from the medication used today.  Preparing for your Surgery  Plan for surgery on Aug 06, 2022 with Dr. [name] at [facility]. You will be scheduled for examination under anesthesia, cold knife conization of the cervix (cone shaped biopsy of the cervix), endocervical curettage (sampling from the inner canal of the cervix).   Pre-operative Testing -You will receive a phone call from presurgical testing at [facility] to discuss surgery instructions and arrange for lab work if needed.  -Bring your insurance card, copy of an advanced directive if applicable, medication list.  -DO NOT TAKE YOUR ASPIRIN THE DAY OF SURGERY.  -Do not take supplements such as fish oil (omega 3), red yeast rice, turmeric before your surgery. You want to avoid medications with aspirin in them including headache powders such as BC or Goody's), Excedrin migraine. STOP FISH OIL NOW.  Day Before Surgery at Home -You will be advised you can have clear liquids up until 3 hours before your surgery.    Your role in recovery Your role is to become active as soon as directed by your doctor, while still giving yourself time to heal.  Rest when you feel tired. You will be asked to do the following in order to speed your recovery:  - Cough and breathe deeply. This helps to clear and expand your lungs and can prevent pneumonia after surgery.  - STAY ACTIVE WHEN YOU GET HOME. Do mild physical activity. Walking or moving your legs help your circulation and body functions return to normal. Do not try to get up or walk alone the first time after surgery.   -If you develop swelling on one leg or the other, pain in the back of your leg, redness/warmth in one of your legs, please call the office or go to the Emergency Room to have a doppler to rule out a blood clot. For shortness  of breath, chest pain-seek care in the Emergency Room as soon as possible. - Actively manage your pain. Managing your pain lets you move in comfort. We will ask you to rate your pain on a scale of zero to 10. It is your responsibility to tell your doctor or nurse where and how much you hurt so your pain can be treated.  Special Considerations -Your final pathology results from surgery should be available around one week after surgery and the results will be relayed to you when available.  -FMLA forms can be faxed to [phone] and please allow 5-7 business days for completion.  Pain Management After Surgery -You will be prescribed your pain medication and bowel regimen medications before surgery so that you can have these available when you are discharged from the hospital. The pain medication is for use ONLY AFTER surgery and a new prescription will not be given.   -Make sure that you have Tylenol and Ibuprofen at home IF YOU ARE ABLE TO TAKE THESE MEDICATIONS to use on a regular basis after surgery for pain control. We recommend alternating the medications every hour to six hours since they work differently and are processed in the body differently for pain relief.  -Review the attached handout on narcotic use and their risks and side effects.   Bowel Regimen -You will be prescribed Sennakot-S to take nightly to prevent constipation especially if you are taking the narcotic pain medication intermittently.  It is important to prevent constipation and drink adequate amounts of liquids. You can stop taking this medication when you are not taking pain medication and you are back on your normal bowel routine.  Risks of Surgery Risks of surgery are low but include bleeding, infection, damage to surrounding structures, re-operation, blood clots, and very rarely death.  AFTER SURGERY INSTRUCTIONS  Return to work:  variable based on occupation  You may notice a piece of gauze like-material come  out of the vagina over the next several days. This is NORMAL and is used during surgery to help stop bleeding at the cervix.   Activity: 1. Be up and out of the bed during the day.  Take a nap if needed.  You may walk up steps but be careful and use the hand rail.  Stair climbing will tire you more than you think, you may need to stop part way and rest.   2. No lifting or straining for 2 weeks minimum over 10 pounds. No pushing, pulling, straining for 2 weeks.  3. No driving for minimum 24 hours after surgery.  Do not drive if you are taking narcotic pain medicine and make sure that your reaction time has returned.   4. You can shower as soon as the next day after surgery. Shower daily. No tub baths or submerging your body in water until cleared by your surgeon. If you have the soap that was given to you by pre-surgical testing that was used before surgery, you do not need to use it afterwards because this can irritate your incisions.   5. No sexual activity and nothing in the vagina for 4-6 weeks.  6. You may experience vaginal spotting and discharge after surgery.  The spotting is normal but if you experience heavy bleeding, call our office.  7. Take Tylenol or ibuprofen first for pain if you are able to take these medications and only use narcotic pain medication for severe pain not relieved by the Tylenol or Ibuprofen.  Monitor your Tylenol intake to a max of 4,000 mg in a 24 hour period. You can alternate these medications after surgery.  Diet: 1. Low sodium Heart Healthy Diet is recommended but you are cleared to resume your normal (before surgery) diet after your procedure.  2. It is safe to use a laxative, such as Miralax or Colace, if you have difficulty moving your bowels. You have been prescribed Sennakot at bedtime every evening to keep bowel movements regular and to prevent constipation.    Wound Care: 1. Keep clean and dry.  Shower daily.  Reasons to call the Doctor: Fever -  Oral temperature greater than 100.4 degrees Fahrenheit Foul-smelling vaginal discharge Difficulty urinating Nausea and vomiting Increased pain at the site of the incision that is unrelieved with pain medicine. Difficulty breathing with or without chest pain New calf pain especially if only on one side Sudden, continuing increased vaginal bleeding with or without clots.   Contacts: For questions or concerns you should contact:  Dr. [name] at [phone]  Warner Mccreedy, NP at [phone]  After Hours: call [phone] and have the GYN Oncologist paged/contacted (after 5 pm or on the weekends).  Messages sent via mychart are for non-urgent matters and are not responded to after hours so for urgent needs, please call the after hours number.

## 2022-07-29 NOTE — Progress Notes (Signed)
Patient here for new patient consultation with Dr. Alvester Morin and for a pre-operative appointment prior to her scheduled surgery on Aug 06, 2022. She is scheduled for examination under anesthesia, cold knife conization of the cervix, endocervical curettage. The surgery was discussed in detail.  See after visit summary for additional details.   Discussed post-op pain management in detail including the aspects of the enhanced recovery pathway.  Advised her that a new prescription would be sent in for tramadol and it is only to be used for after her upcoming surgery.  We discussed the use of tylenol post-op and to monitor for a maximum of 4,000 mg in a 24 hour period.  Also prescribed sennakot to be used after surgery and to hold if having loose stools.  Discussed bowel regimen in detail.     Discussed the use of SCDs and measures to take at home to prevent DVT including frequent mobility.  Reportable signs and symptoms of DVT discussed. Post-operative instructions discussed and expectations for after surgery.     5 minutes spent with the patient.  Verbalizing understanding of material discussed. No needs or concerns voiced at the end of the visit.   Advised patient to call for any needs.  Advised that her post-operative medications had been prescribed and could be picked up at any time.    This appointment is included in the global surgical bundle as pre-operative teaching and has no charge.

## 2022-07-30 ENCOUNTER — Telehealth: Payer: Self-pay

## 2022-07-30 NOTE — Telephone Encounter (Signed)
See below message from [facility] NP,   LVM with [facility], Dr.Olin's office, to call back for recommendations on prophylactic antibiotics before procedure.

## 2022-07-30 NOTE — Telephone Encounter (Signed)
Called Jacqueline Hawkins and advised her that Dr. Alvester Morin spoke to Dr. Charlann Boxer today and confirmed that she does not need prophylactic antibiotics before her procedure on 08/06/22.  She verbalized understanding and agreement.

## 2022-07-30 NOTE — Telephone Encounter (Signed)
-----   Message from Doylene Bode, NP sent at 07/29/2022 12:13 PM EDT ----- Patient is scheduled for a EUA, cone biopsy of the cervix. She states she was told by her orthopedist, Dr. Charlann Boxer, she has to take prophylactic antibiotics before any procedure including dental. Please reach out to his office to see what the recommendations are and then let the patient know.   Thanks

## 2022-08-01 ENCOUNTER — Encounter (HOSPITAL_BASED_OUTPATIENT_CLINIC_OR_DEPARTMENT_OTHER): Payer: Self-pay | Admitting: Psychiatry

## 2022-08-01 LAB — SURGICAL PATHOLOGY

## 2022-08-01 NOTE — Progress Notes (Signed)
Spoke w/ via phone for pre-op interview--- pt Lab needs dos----  no             Lab results------ no COVID test -----patient states asymptomatic no test needed Arrive at ------- 0745 on 08-06-2022 NPO after MN NO Solid Food.  Clear liquids from MN until--- 0645 Med rec completed Medications to take morning of surgery ----- wellbutrin, lipitor Diabetic medication ----- n/a Patient instructed no nail polish to be worn day of surgery Patient instructed to bring photo id and insurance card day of surgery Patient aware to have Driver (ride ) / caregiver    for 24 hours after surgery -- pt stated either her son, josh/ or her daughter, cindi Patient Special Instructions ----- asked to bring her rescue inhaler dos Pre-Op special Instructions ----- n/a Patient verbalized understanding of instructions that were given at this phone interview. Patient denies shortness of breath, chest pain, fever, cough at this phone interview.

## 2022-08-05 ENCOUNTER — Encounter: Payer: Self-pay | Admitting: Psychiatry

## 2022-08-05 ENCOUNTER — Telehealth: Payer: Self-pay | Admitting: Surgery

## 2022-08-05 NOTE — Anesthesia Preprocedure Evaluation (Signed)
Anesthesia Evaluation  Patient identified by MRN, date of birth, ID band Patient awake    Reviewed: Allergy & Precautions, H&P , NPO status , Patient's Chart, lab work & pertinent test results  Airway Mallampati: II  TM Distance: >3 FB Neck ROM: Full    Dental no notable dental hx. (+) Teeth Intact, Dental Advisory Given, Caps   Pulmonary neg pulmonary ROS, asthma , sleep apnea and Continuous Positive Airway Pressure Ventilation , former smoker   Pulmonary exam normal breath sounds clear to auscultation       Cardiovascular Exercise Tolerance: Good negative cardio ROS Normal cardiovascular exam Rhythm:Regular Rate:Normal     Neuro/Psych  PSYCHIATRIC DISORDERS  Depression    negative neurological ROS  negative psych ROS   GI/Hepatic negative GI ROS, Neg liver ROS,,,  Endo/Other  negative endocrine ROSdiabetes, Type 2  Morbid obesity  Renal/GU negative Renal ROS  negative genitourinary   Musculoskeletal negative musculoskeletal ROS (+) Arthritis , Osteoarthritis,    Abdominal   Peds negative pediatric ROS (+)  Hematology negative hematology ROS (+) Blood dyscrasia, anemia   Anesthesia Other Findings   Reproductive/Obstetrics negative OB ROS                             Anesthesia Physical Anesthesia Plan  ASA: 3  Anesthesia Plan: General   Post-op Pain Management: Minimal or no pain anticipated, Tylenol PO (pre-op)* and Celebrex PO (pre-op)*   Induction: Intravenous  PONV Risk Score and Plan: 3 and Ondansetron, Dexamethasone and Treatment may vary due to age or medical condition  Airway Management Planned: LMA  Additional Equipment: None  Intra-op Plan:   Post-operative Plan: Extubation in OR  Informed Consent: I have reviewed the patients History and Physical, chart, labs and discussed the procedure including the risks, benefits and alternatives for the proposed anesthesia with  the patient or authorized representative who has indicated his/her understanding and acceptance.       Plan Discussed with: Anesthesiologist and CRNA  Anesthesia Plan Comments:        Anesthesia Quick Evaluation

## 2022-08-05 NOTE — Discharge Instructions (Signed)
AFTER SURGERY INSTRUCTIONS   Return to work:  variable based on occupation   You may notice a piece of gauze like-material come out of the vagina over the next several days. This is NORMAL and is used during surgery to help stop bleeding at the cervix.    Activity: 1. Be up and out of the bed during the day.  Take a nap if needed.  You may walk up steps but be careful and use the hand rail.  Stair climbing will tire you more than you think, you may need to stop part way and rest.    2. No lifting or straining for 2 weeks minimum over 10 pounds. No pushing, pulling, straining for 2 weeks.   3. No driving for minimum 24 hours after surgery.  Do not drive if you are taking narcotic pain medicine and make sure that your reaction time has returned.    4. You can shower as soon as the next day after surgery. Shower daily. No tub baths or submerging your body in water until cleared by your surgeon. If you have the soap that was given to you by pre-surgical testing that was used before surgery, you do not need to use it afterwards because this can irritate your incisions.    5. No sexual activity and nothing in the vagina for 4-6 weeks.   6. You may experience vaginal spotting and discharge after surgery.  The spotting is normal but if you experience heavy bleeding, call our office.   7. Take Tylenol or ibuprofen first for pain if you are able to take these medications and only use narcotic pain medication for severe pain not relieved by the Tylenol or Ibuprofen.  Monitor your Tylenol intake to a max of 4,000 mg in a 24 hour period. You can alternate these medications after surgery.   Diet: 1. Low sodium Heart Healthy Diet is recommended but you are cleared to resume your normal (before surgery) diet after your procedure.   2. It is safe to use a laxative, such as Miralax or Colace, if you have difficulty moving your bowels. You have been prescribed Sennakot at bedtime every evening to keep bowel  movements regular and to prevent constipation.     Wound Care: 1. Keep clean and dry.  Shower daily.   Reasons to call the Doctor: Fever - Oral temperature greater than 100.4 degrees Fahrenheit Foul-smelling vaginal discharge Difficulty urinating Nausea and vomiting Increased pain at the site of the incision that is unrelieved with pain medicine. Difficulty breathing with or without chest pain New calf pain especially if only on one side Sudden, continuing increased vaginal bleeding with or without clots.   Contacts: For questions or concerns you should contact:   Dr. [name] at [phone]   Warner Mccreedy, NP at [phone]   After Hours: call [phone] and have the GYN Oncologist paged/contacted (after 5 pm or on the weekends).   Messages sent via mychart are for non-urgent matters and are not responded to after hours so for urgent needs, please call the after hours number.  No Tylenol until after 2:00 p.m.   Post Anesthesia Home Care Instructions  Activity: Get plenty of rest for the remainder of the day. A responsible individual must stay with you for 24 hours following the procedure.  For the next 24 hours, DO NOT: -Drive a car -Advertising copywriter -Drink alcoholic beverages -Take any medication unless instructed by your physician -Make any legal decisions or sign important papers.  Meals: Start with liquid foods such as gelatin or soup. Progress to regular foods as tolerated. Avoid greasy, spicy, heavy foods. If nausea and/or vomiting occur, drink only clear liquids until the nausea and/or vomiting subsides. Call your physician if vomiting continues.  Special Instructions/Symptoms: Your throat may feel dry or sore from the anesthesia or the breathing tube placed in your throat during surgery. If this causes discomfort, gargle with warm salt water. The discomfort should disappear within 24 hours.

## 2022-08-05 NOTE — Telephone Encounter (Signed)
Telephone call to check on pre-operative status.  Patient compliant with pre-operative instructions.  Reinforced nothing to eat after midnight. Clear liquids until 6:30am. Patient to arrive at 7:30am. Verified that post-op medications have been sent to patient's preferred pharmacy.  No questions or concerns voiced.  Instructed to call for any needs.

## 2022-08-06 ENCOUNTER — Other Ambulatory Visit: Payer: Self-pay

## 2022-08-06 ENCOUNTER — Encounter (HOSPITAL_BASED_OUTPATIENT_CLINIC_OR_DEPARTMENT_OTHER)
Admission: RE | Disposition: A | Discharge status: Home / Self Care | Payer: Self-pay | Source: Home / Self Care | Attending: Psychiatry

## 2022-08-06 ENCOUNTER — Encounter (HOSPITAL_BASED_OUTPATIENT_CLINIC_OR_DEPARTMENT_OTHER): Payer: Self-pay | Admitting: Psychiatry

## 2022-08-06 ENCOUNTER — Ambulatory Visit (HOSPITAL_BASED_OUTPATIENT_CLINIC_OR_DEPARTMENT_OTHER): Payer: Medicare HMO | Admitting: Certified Registered"

## 2022-08-06 ENCOUNTER — Ambulatory Visit (HOSPITAL_BASED_OUTPATIENT_CLINIC_OR_DEPARTMENT_OTHER)
Admission: RE | Admit: 2022-08-06 | Discharge: 2022-08-06 | Disposition: A | Discharge status: Home / Self Care | Payer: Medicare HMO | Attending: Psychiatry | Admitting: Psychiatry

## 2022-08-06 DIAGNOSIS — Z87891 Personal history of nicotine dependence: Secondary | ICD-10-CM | POA: Insufficient documentation

## 2022-08-06 DIAGNOSIS — J45909 Unspecified asthma, uncomplicated: Secondary | ICD-10-CM

## 2022-08-06 DIAGNOSIS — D069 Carcinoma in situ of cervix, unspecified: Secondary | ICD-10-CM

## 2022-08-06 DIAGNOSIS — Z01818 Encounter for other preprocedural examination: Secondary | ICD-10-CM

## 2022-08-06 DIAGNOSIS — N879 Dysplasia of cervix uteri, unspecified: Secondary | ICD-10-CM

## 2022-08-06 DIAGNOSIS — G4733 Obstructive sleep apnea (adult) (pediatric): Secondary | ICD-10-CM

## 2022-08-06 DIAGNOSIS — Z9989 Dependence on other enabling machines and devices: Secondary | ICD-10-CM

## 2022-08-06 DIAGNOSIS — N72 Inflammatory disease of cervix uteri: Secondary | ICD-10-CM

## 2022-08-06 HISTORY — DX: Major depressive disorder, single episode, unspecified: F32.9

## 2022-08-06 HISTORY — DX: Presence of spectacles and contact lenses: Z97.3

## 2022-08-06 HISTORY — PX: CERVICAL CONIZATION W/BX: SHX1330

## 2022-08-06 HISTORY — DX: Iron deficiency anemia, unspecified: D50.9

## 2022-08-06 HISTORY — DX: Mild intermittent asthma, uncomplicated: J45.20

## 2022-08-06 HISTORY — PX: DILATION AND CURETTAGE OF UTERUS: SHX78

## 2022-08-06 HISTORY — DX: Personal history of other endocrine, nutritional and metabolic disease: Z86.39

## 2022-08-06 HISTORY — DX: Carcinoma in situ of cervix, unspecified: D06.9

## 2022-08-06 SURGERY — EXAM UNDER ANESTHESIA
Anesthesia: General

## 2022-08-06 MED ORDER — LIDOCAINE 2% (20 MG/ML) 5 ML SYRINGE
INTRAMUSCULAR | Status: DC | PRN
  Administered 2022-08-06: 80 mg via INTRAVENOUS

## 2022-08-06 MED ORDER — DEXAMETHASONE SODIUM PHOSPHATE 10 MG/ML IJ SOLN
INTRAMUSCULAR | Status: DC | PRN
  Administered 2022-08-06: 10 mg via INTRAVENOUS

## 2022-08-06 MED ORDER — MIDAZOLAM HCL 2 MG/2ML IJ SOLN
INTRAMUSCULAR | Status: DC | PRN
  Administered 2022-08-06: 2 mg via INTRAVENOUS

## 2022-08-06 MED ORDER — MIDAZOLAM HCL 2 MG/2ML IJ SOLN
INTRAMUSCULAR | Status: AC
  Filled 2022-08-06: qty 2

## 2022-08-06 MED ORDER — BUPIVACAINE HCL (PF) 0.25 % IJ SOLN
INTRAMUSCULAR | Status: DC | PRN
  Administered 2022-08-06: 10 mL

## 2022-08-06 MED ORDER — PROPOFOL 10 MG/ML IV BOLUS
INTRAVENOUS | Status: DC | PRN
  Administered 2022-08-06: 80 mg via INTRAVENOUS
  Administered 2022-08-06: 120 mg via INTRAVENOUS

## 2022-08-06 MED ORDER — ACETAMINOPHEN 500 MG PO TABS
1000.0000 mg | ORAL_TABLET | ORAL | Status: AC
  Administered 2022-08-06: 1000 mg via ORAL

## 2022-08-06 MED ORDER — ACETAMINOPHEN 160 MG/5ML PO SOLN: 325.0000 mg | ORAL | Status: DC | PRN

## 2022-08-06 MED ORDER — FERRIC SUBSULFATE (BULK) SOLN
Status: DC | PRN
  Administered 2022-08-06: 1 via TOPICAL

## 2022-08-06 MED ORDER — ACETAMINOPHEN 500 MG PO TABS
ORAL_TABLET | ORAL | Status: AC
  Filled 2022-08-06: qty 2

## 2022-08-06 MED ORDER — PROPOFOL 10 MG/ML IV BOLUS
INTRAVENOUS | Status: AC
  Filled 2022-08-06: qty 20

## 2022-08-06 MED ORDER — AZITHROMYCIN 500 MG PO TABS
1000.0000 mg | ORAL_TABLET | Freq: Once | ORAL | Status: AC
  Administered 2022-08-06: 1000 mg via ORAL
  Filled 2022-08-06: qty 2

## 2022-08-06 MED ORDER — OXYCODONE HCL 5 MG PO TABS: 5.0000 mg | ORAL_TABLET | Freq: Once | ORAL | Status: DC | PRN

## 2022-08-06 MED ORDER — SUCCINYLCHOLINE CHLORIDE 200 MG/10ML IV SOSY
PREFILLED_SYRINGE | INTRAVENOUS | Status: DC | PRN
  Administered 2022-08-06: 200 mg via INTRAVENOUS

## 2022-08-06 MED ORDER — ONDANSETRON HCL 4 MG/2ML IJ SOLN
INTRAMUSCULAR | Status: AC
  Filled 2022-08-06: qty 2

## 2022-08-06 MED ORDER — DEXAMETHASONE SODIUM PHOSPHATE 10 MG/ML IJ SOLN: 4.0000 mg | INTRAMUSCULAR | Status: DC

## 2022-08-06 MED ORDER — DEXAMETHASONE SODIUM PHOSPHATE 10 MG/ML IJ SOLN
INTRAMUSCULAR | Status: AC
  Filled 2022-08-06: qty 1

## 2022-08-06 MED ORDER — PHENYLEPHRINE 80 MCG/ML (10ML) SYRINGE FOR IV PUSH (FOR BLOOD PRESSURE SUPPORT)
PREFILLED_SYRINGE | INTRAVENOUS | Status: DC | PRN
  Administered 2022-08-06: 160 ug via INTRAVENOUS
  Administered 2022-08-06: 80 ug via INTRAVENOUS

## 2022-08-06 MED ORDER — ACETAMINOPHEN 325 MG PO TABS: 325.0000 mg | ORAL_TABLET | ORAL | Status: DC | PRN

## 2022-08-06 MED ORDER — FENTANYL CITRATE (PF) 250 MCG/5ML IJ SOLN
INTRAMUSCULAR | Status: DC | PRN
  Administered 2022-08-06: 50 ug via INTRAVENOUS
  Administered 2022-08-06: 25 ug via INTRAVENOUS

## 2022-08-06 MED ORDER — ACETIC ACID 5 % SOLN
Status: DC | PRN
  Administered 2022-08-06: 1 via TOPICAL

## 2022-08-06 MED ORDER — OXYCODONE HCL 5 MG/5ML PO SOLN: 5.0000 mg | Freq: Once | ORAL | Status: DC | PRN

## 2022-08-06 MED ORDER — LACTATED RINGERS IV SOLN: INTRAVENOUS | Status: DC | PRN

## 2022-08-06 MED ORDER — IODINE STRONG (LUGOLS) 5 % PO SOLN
ORAL | Status: DC | PRN
  Administered 2022-08-06: .2 mL via ORAL

## 2022-08-06 MED ORDER — LIDOCAINE HCL (PF) 2 % IJ SOLN
INTRAMUSCULAR | Status: AC
  Filled 2022-08-06: qty 5

## 2022-08-06 MED ORDER — MEPERIDINE HCL 25 MG/ML IJ SOLN: 6.2500 mg | INTRAMUSCULAR | Status: DC | PRN

## 2022-08-06 MED ORDER — HEMOSTATIC AGENTS (NO CHARGE) OPTIME
TOPICAL | Status: DC | PRN
  Administered 2022-08-06: 1 via TOPICAL

## 2022-08-06 MED ORDER — ONDANSETRON HCL 4 MG/2ML IJ SOLN: 4.0000 mg | Freq: Once | INTRAMUSCULAR | Status: DC | PRN

## 2022-08-06 MED ORDER — FENTANYL CITRATE (PF) 100 MCG/2ML IJ SOLN: 25.0000 ug | INTRAMUSCULAR | Status: DC | PRN

## 2022-08-06 MED ORDER — LACTATED RINGERS IV SOLN: INTRAVENOUS | Status: DC

## 2022-08-06 MED ORDER — ONDANSETRON HCL 4 MG/2ML IJ SOLN
INTRAMUSCULAR | Status: DC | PRN
  Administered 2022-08-06: 4 mg via INTRAVENOUS

## 2022-08-06 MED ORDER — FENTANYL CITRATE (PF) 100 MCG/2ML IJ SOLN
INTRAMUSCULAR | Status: AC
  Filled 2022-08-06: qty 2

## 2022-08-06 MED ORDER — ALBUTEROL SULFATE HFA 108 (90 BASE) MCG/ACT IN AERS
INHALATION_SPRAY | RESPIRATORY_TRACT | Status: DC | PRN
  Administered 2022-08-06 (×2): 2 via RESPIRATORY_TRACT

## 2022-08-06 SURGICAL SUPPLY — 38 items
APL SWBSTK 6 STRL LF DISP (MISCELLANEOUS) ×1
APPLICATOR COTTON TIP 6 STRL (MISCELLANEOUS) ×1 IMPLANT
APPLICATOR COTTON TIP 6IN STRL (MISCELLANEOUS) ×1
BLADE EXTENDED COATED 6.5IN (ELECTRODE) ×1 IMPLANT
BLADE SURG 11 STRL SS (BLADE) ×1 IMPLANT
BLADE SURG 15 STRL LF DISP TIS (BLADE) IMPLANT
BLADE SURG 15 STRL SS (BLADE)
CATH ROBINSON RED A/P 16FR (CATHETERS) IMPLANT
CLEANER CAUTERY TIP PAD (MISCELLANEOUS) IMPLANT
DRSG TELFA 3X8 NADH STRL (GAUZE/BANDAGES/DRESSINGS) IMPLANT
ELECT BALL LEEP 5MM RED (ELECTRODE) IMPLANT
GAUZE 4X4 16PLY ~~LOC~~+RFID DBL (SPONGE) ×1 IMPLANT
GLOVE BIO SURGEON STRL SZ 6 (GLOVE) ×2 IMPLANT
GOWN STRL REUS W/TWL LRG LVL3 (GOWN DISPOSABLE) ×1 IMPLANT
HEMOSTAT SURGICEL 4X8 (HEMOSTASIS) ×1 IMPLANT
KIT TURNOVER CYSTO (KITS) ×1 IMPLANT
NDL HYPO 25X1 1.5 SAFETY (NEEDLE) IMPLANT
NEEDLE HYPO 25X1 1.5 SAFETY (NEEDLE) IMPLANT
NS IRRIG 500ML POUR BTL (IV SOLUTION) ×1 IMPLANT
PACK PERINEAL COLD (PAD) ×1 IMPLANT
PACK VAGINAL WOMENS (CUSTOM PROCEDURE TRAY) ×1 IMPLANT
PAD PREP 24X48 CUFFED NSTRL (MISCELLANEOUS) ×1 IMPLANT
PENCIL SMOKE EVAC W/HOLSTER (ELECTROSURGICAL) IMPLANT
SLEEVE SCD COMPRESS KNEE MED (STOCKING) ×1 IMPLANT
SPONGE SURGIFOAM ABS GEL 12-7 (HEMOSTASIS) IMPLANT
SUT SILK 2 0 SH (SUTURE) IMPLANT
SUT VIC AB 0 CT1 36 (SUTURE) ×2 IMPLANT
SUT VIC AB 2-0 CT1 27 (SUTURE) ×1
SUT VIC AB 2-0 CT1 TAPERPNT 27 (SUTURE) IMPLANT
SUT VIC AB 2-0 SH 27 (SUTURE)
SUT VIC AB 2-0 SH 27XBRD (SUTURE) IMPLANT
SUT VIC AB 2-0 UR6 27 (SUTURE) IMPLANT
SWAB OB GYN 8IN STERILE 2PK (MISCELLANEOUS) ×1 IMPLANT
SYR BULB IRRIG 60ML STRL (SYRINGE) ×1 IMPLANT
TOWEL OR 17X24 6PK STRL BLUE (TOWEL DISPOSABLE) ×2 IMPLANT
TUBE CONNECTING 12X1/4 (SUCTIONS) IMPLANT
WATER STERILE IRR 1000ML POUR (IV SOLUTION) ×1 IMPLANT
YANKAUER SUCT BULB TIP NO VENT (SUCTIONS) IMPLANT

## 2022-08-06 NOTE — Progress Notes (Signed)
Patient has dry cough and throat irritation from intubation.  Instructed to use warm salt water gargles, cough drops of chloraseptic spray.

## 2022-08-06 NOTE — Op Note (Signed)
GYNECOLOGIC ONCOLOGY OPERATIVE NOTE  Date of Service: 08/06/2022  Preoperative Diagnosis: Cervical dysplasia  Postoperative Diagnosis: Same  Procedures: Cold knife conization, endocervical curettage  Surgeon: [name], MD  Assistants: None  Anesthesia: Choice  Estimated Blood Loss: 50 mL    Fluids: See anesthesia record  Urine Output: Voided prior to OR  Findings: On bimanual exam, slight firmness of anterior lip of cervix. No parametrial nodularity. On speculum exam, recent biopsy site healing at the posterior cervico-vaginal junction. Erythematous inflammed appearing cervix with hyperglandular appearance without distinct mass. No acetowhite changes with acetic applied. Mild decreased uptake with Lugol's across face of cervix broadly.  Specimens:  ID Type Source Tests Collected by Time Destination  1 : cervical conization Stitch at 1200 Tissue PATH Soft tissue SURGICAL PATHOLOGY [name], MD 08/06/2022 [phone]   2 : ECC Tissue PATH Soft tissue SURGICAL PATHOLOGY [name], MD 08/06/2022 0940     Complications:  None  Indications for Procedure: Jacqueline Hawkins is a 67 y.o. woman with cervical biopsies and ECC demonstrating CIN3.  Prior to the procedure, all risks, benefits, and alternatives were discussed and informed surgical consent was signed.  Procedure: The patient was taken to the operating room where she was prepped and draped in the normal sterile fashion in the dorsal lithotomy position.  Anesthesia was obtained without difficulty.  A speculum was placed in the vagina. A cervical block was performed with injecting 5cc of 1% lidocaine each at 3 and 9 o'clock. Two sutures of 0 Vicryl were used to ligate the cervical branches of the cervical artery at the 3 and 9 o'clock positions in a figure-of-eight suture and then tagged.   Acetic acid was applied to the cervix. No changes were noted. Then Lugol solution was applied to the cervix to visualize the  transformation zone. Using an 11-blade scalpel, cold-knife cone specimen was then obtained in standard fashion.  A stitch was placed at the 12 o'clock position. Next, endocervical curetting was performed of the canal. This was handed off also for pathologic review.  The base of the cervical cone specimen was then cauterized using Bovie cautery. Monsel's were then applied. One figure of eight stitch was placed in the posterior lip of the cervix with 2-0 vicryl. The cone site was noted to be hemostatic. Surgicell was placed in the cone bed and the lateral sutures were gently tied over the cervix to secure this pack at the cervical bed.    All the instruments were removed from the patient's vagina. Sponge, lap and needle counts were correct x2.   No antibiotics were indicated.  The patient tolerated the procedure well and was transferred to the recovery room in stable condition.   [name], MD Gynecologic Oncology

## 2022-08-06 NOTE — Anesthesia Procedure Notes (Signed)
Procedure Name: Intubation Date/Time: 08/06/2022 9:21 AM  Performed by: Dairl Ponder, CRNAPre-anesthesia Checklist: Patient identified, Emergency Drugs available, Suction available and Patient being monitored Patient Re-evaluated:Patient Re-evaluated prior to induction Oxygen Delivery Method: Circle System Utilized Preoxygenation: Pre-oxygenation with 100% oxygen Induction Type: IV induction Ventilation: Mask ventilation without difficulty Laryngoscope Size: Mac and 4 Grade View: Grade I Tube type: Oral Tube size: 7.0 mm Number of attempts: 1 Airway Equipment and Method: Stylet and Oral airway Placement Confirmation: ETT inserted through vocal cords under direct vision, positive ETCO2 and breath sounds checked- equal and bilateral Secured at: 21 cm Tube secured with: Tape Dental Injury: Teeth and Oropharynx as per pre-operative assessment

## 2022-08-06 NOTE — Interval H&P Note (Signed)
History and Physical Interval Note:  08/06/2022 9:02 AM  Jacqueline Hawkins  has presented today for surgery, with the diagnosis of CERVICAL DYSPLASIA.  The various methods of treatment have been discussed with the patient and family. After consideration of risks, benefits and other options for treatment, the patient has consented to  Procedure(s): EXAM UNDER ANESTHESIA (N/A) CONIZATION CERVIX WITH BIOPSY (N/A) ENDOCERVICAL CURETTAGE (N/A) as a surgical intervention.  The patient's history has been reviewed, patient examined, no change in status, stable for surgery.  I have reviewed the patient's chart and labs.  Questions were answered to the patient's satisfaction.     Shjon Lizarraga

## 2022-08-06 NOTE — Transfer of Care (Signed)
Immediate Anesthesia Transfer of Care Note  Patient: Jacqueline Hawkins  Procedure(s) Performed: Francia Greaves UNDER ANESTHESIA CONIZATION CERVIX WITH BIOPSY ENDOCERVICAL CURETTAGE  Patient Location: PACU  Anesthesia Type:General  Level of Consciousness: awake, alert , oriented, and patient cooperative  Airway & Oxygen Therapy: Patient Spontanous Breathing and Patient connected to face mask oxygen  Post-op Assessment: Report given to RN, Post -op Vital signs reviewed and stable, and Patient moving all extremities X 4  Post vital signs: Reviewed and stable  Last Vitals:  Vitals Value Taken Time  BP 125/77 08/06/22 1021  Temp    Pulse 84 08/06/22 1024  Resp 17 08/06/22 1024  SpO2 94 % 08/06/22 1024  Vitals shown include unvalidated device data.  Last Pain:  Vitals:   08/06/22 0754  TempSrc: Oral  PainSc: 0-No pain      Patients Stated Pain Goal: 5 (08/06/22 0754)  Complications: No notable events documented.

## 2022-08-06 NOTE — Anesthesia Postprocedure Evaluation (Signed)
Anesthesia Post Note  Patient: Jacqueline Hawkins  Procedure(s) Performed: EXAM UNDER ANESTHESIA CONIZATION CERVIX WITH BIOPSY ENDOCERVICAL CURETTAGE     Patient location during evaluation: PACU Anesthesia Type: General Level of consciousness: awake and alert Pain management: pain level controlled Vital Signs Assessment: post-procedure vital signs reviewed and stable Respiratory status: spontaneous breathing, nonlabored ventilation, respiratory function stable and patient connected to nasal cannula oxygen Cardiovascular status: blood pressure returned to baseline and stable Postop Assessment: no apparent nausea or vomiting Anesthetic complications: no   No notable events documented.  Last Vitals:  Vitals:   08/06/22 0754 08/06/22 1021  BP: (!) 146/80 125/77  Pulse: (!) 104 87  Resp: 17 17  Temp: 37.2 C 36.6 C  SpO2: 97% 99%    Last Pain:  Vitals:   08/06/22 1021  TempSrc:   PainSc: 0-No pain                 Reynoldo Mainer

## 2022-08-07 ENCOUNTER — Telehealth: Payer: Self-pay | Admitting: *Deleted

## 2022-08-07 ENCOUNTER — Encounter (HOSPITAL_BASED_OUTPATIENT_CLINIC_OR_DEPARTMENT_OTHER): Payer: Self-pay | Admitting: Psychiatry

## 2022-08-07 NOTE — Telephone Encounter (Signed)
Spoke with Jacqueline Hawkins this morning. She states she is eating, drinking and urinating well. She has not had a BM yet but is passing gas. She is taking senokot as prescribed and encouraged her to drink plenty of water. She denies fever or chills. I She rates her pain 0/10. Her pain is controlled with tylenol.    Instructed to call office with any fever, chills, purulent drainage, uncontrolled pain or any other questions or concerns. Patient verbalizes understanding.   Pt aware of post op appointments as well as the office number [phone] and after hours number [phone] to call if she has any questions or concerns

## 2022-08-09 ENCOUNTER — Other Ambulatory Visit: Payer: Self-pay | Admitting: Family Medicine

## 2022-08-09 DIAGNOSIS — F325 Major depressive disorder, single episode, in full remission: Secondary | ICD-10-CM

## 2022-08-21 ENCOUNTER — Inpatient Hospital Stay (HOSPITAL_BASED_OUTPATIENT_CLINIC_OR_DEPARTMENT_OTHER): Payer: Medicare HMO | Admitting: Psychiatry

## 2022-08-21 VITALS — BP 125/80 | HR 79 | Temp 98.6°F | Resp 20 | Ht 62.99 in | Wt 243.4 lb

## 2022-08-21 DIAGNOSIS — N72 Inflammatory disease of cervix uteri: Secondary | ICD-10-CM

## 2022-08-21 DIAGNOSIS — Z7189 Other specified counseling: Secondary | ICD-10-CM

## 2022-08-21 DIAGNOSIS — D069 Carcinoma in situ of cervix, unspecified: Secondary | ICD-10-CM | POA: Diagnosis not present

## 2022-08-21 DIAGNOSIS — Z9889 Other specified postprocedural states: Secondary | ICD-10-CM

## 2022-08-21 LAB — SURGICAL PATHOLOGY

## 2022-08-21 MED ORDER — DOXYCYCLINE HYCLATE 100 MG PO TABS
100.0000 mg | ORAL_TABLET | Freq: Two times a day (BID) | ORAL | 0 refills | Status: AC
Start: 2022-08-21 — End: 2022-08-28

## 2022-08-21 NOTE — Patient Instructions (Signed)
It was a pleasure to see you in clinic today. - I will send an antibiotic. You will take this twice a day for 7 days. - Return visit planned for 4 weeks  Thank you very much for allowing me to provide care for you today.  I appreciate your confidence in choosing our Gynecologic Oncology team at [facility].  If you have any questions about your visit today please call our office or send [place] a MyChart message and we will get back to you as soon as possible.

## 2022-08-21 NOTE — Progress Notes (Signed)
Gynecologic Oncology Return Clinic Visit  Date of Service: 08/21/2022 Referring Provider: Janean Sark, MD   Assessment & Plan: Jacqueline Hawkins is a 67 y.o. 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  Postop: - Pt recovering well from surgery. - Yellow discharge on exam concerning for infection. Rx for doxycycline for 7d sent to pharmacy.  - Intraoperative findings and pathology results reviewed. - Ongoing postoperative expectations and precautions reviewed.  - Plan follow-up exam in 4 weeks to ensure clearance of discharge, appropriate healing.  CIN3: - Reviewed pathology results with patient. - Cone with positive endocervical margin that is superseded by negative ECC.  Appears overall to have negative margins.  However, pathology report does not clearly state the deep margin. - Requested additional information from pathology with addendum clearly stating deep margin negative. - Given negative margins, options for ongoing treatment include ongoing close surveillance with follow-up in 6 months and Pap smear at that time.  Also could consider definitive treatment with hysterectomy.  Suspect that additional excisional procedures would be challenging on this patient given extent of remaining cervix.  However, of importance is that the cone did confirm that not all of the changes on the ectocervix were dysplastic as her ectocervical margins were negative, although some of the changes that were visually apparent to the vaginal margin were cut through.  Suspect that these changes were instead predominantly inflammatory in nature based on prior biopsy results of acute inflammation and current appearance of yellow discharge on exam. - Patient wishes to proceed with close surveillance initially at 6 months.  She is aware that if continued abnormal changes, would potentially require additional excisional procedure prior to definitive hysterectomy.  She expresses that she has certain  life stressors going on at the moment, due to which, she would prefer to avoid major surgery at this time. - Offered for pt to follow-up in 2mo with her Gynecologist for exam/pap or to return here. She would like to return to our clinic initially.    RTC 4 weeks.  [name], MD Gynecologic Oncology   Medical Decision Making I personally spent  TOTAL 30 minutes face-to-face and non-face-to-face in the care of this patient, which includes all pre, intra, and post visit time on the date of service.  ----------------------- Reason for Visit: Postop/Treatment counseling  Treatment History: - 12/29/2007: NILM - 01/02/2009: NILM - 07/18/2009: Cervical polyp: Benign endocervical polyp with chronic cervicitis - 07/04/2011: NILM - 06/19/2015: NILM, HR HPV positive - 10/21/2016: NILM, HR HPV negative - 01/16/2022: NILM, HR HPV positive - 04/30/2022: H SIL, HR HPV positive - 04/30/2022: Colposcopy with ECC CIN-3, 6:00 biopsy suspicious for squamous intraepithelial lesion, 9:00 biopsy CIN-3, 3:00 biopsy CIN-3  Interval History: Pt reports that she is recovering well from surgery. She is not having pain. She is eating and drinking well. She is voiding without issue and having regular bowel movements. She notes some small vaginal discharge but not quite blood.    Past Medical/Surgical History: Past Medical History:  Diagnosis Date   Asthma, mild intermittent    followed by pcp   CIN III (cervical intraepithelial neoplasia III)    History of diabetes mellitus    AIC's normal since gastric bypass   Hyperlipidemia    Iron deficiency anemia    MDD (major depressive disorder)    OA (osteoarthritis)    knees   OSA on CPAP 1995   (08-01-2022  per pt using cpap nightly)  followed by dr Wynona Neat (pulmonary);  first dx 1995 used cpap , stopped after gastric bypass;  retested 09-04-2019  in epic , severe osa   Venous insufficiency    Wears glasses     Past Surgical History:  Procedure  Laterality Date   CERVICAL CONIZATION W/BX N/A 08/06/2022   Procedure: CONIZATION CERVIX WITH BIOPSY;  Surgeon: [name], MD;  [facility];  Service: Gynecology;  Laterality: N/A;   COLONOSCOPY WITH ESOPHAGOGASTRODUODENOSCOPY (EGD)  07/21/2015   dr Adela Lank   DILATION AND CURETTAGE OF UTERUS N/A 08/06/2022   Procedure: ENDOCERVICAL CURETTAGE;  Surgeon: [name], MD;  Location: [facility];  Service: Gynecology;  Laterality: N/A;   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 Daphine Deutscher:    via laparoscopy   TOTAL HIP ARTHROPLASTY Left 1999    Family History  Problem Relation Age of Onset   Thyroid disease Mother    Diabetes Father    Heart disease Father    Breast cancer Maternal Aunt    Stroke Cousin    Colon cancer Neg Hx    Ovarian cancer Neg Hx    Endometrial cancer Neg Hx    Pancreatic cancer Neg Hx    Prostate cancer Neg Hx     Social History   Socioeconomic History   Marital status: Divorced    Spouse name: Not on file   Number of children: 2   Years of education: Not on file   Highest education level: Associate degree: occupational, Scientist, product/process development, or vocational program  Occupational History   Occupation: Counsellor    Comment: [employer]  Tobacco Use   Smoking status: Former    Years: 2    Types: Cigarettes    Quit date: 1977    Years since quitting: 47.4   Smokeless tobacco: Never  Vaping Use   Vaping Use: Never used  Substance and Sexual Activity   Alcohol use: Yes    Alcohol/week: 7.0 standard drinks of alcohol    Types: 7 Cans of beer per week    Comment: 08-01-2022  per pt one beer per evening   Drug use: Never   Sexual activity: Not Currently  Other Topics Concern   Not on file  Social History Narrative   Not on file   Social Determinants of Health   Financial Resource Strain: Low Risk  (01/15/2022)   Overall Financial Resource Strain (CARDIA)    Difficulty of  Paying Living Expenses: Not hard at all  Food Insecurity: No Food Insecurity (01/15/2022)   Hunger Vital Sign    Worried About Running Out of Food in the Last Year: Never true    Ran Out of Food in the Last Year: Never true  Transportation Needs: No Transportation Needs (01/15/2022)   PRAPARE - Administrator, Civil Service (Medical): No    Lack of Transportation (Non-Medical): No  Physical Activity: Sufficiently Active (01/15/2022)   Exercise Vital Sign    Days of Exercise per Week: 5 days    Minutes of Exercise per Session: 30 min  Stress: No Stress Concern Present (01/15/2022)   [employer] of Occupational Health - Occupational Stress Questionnaire    Feeling of Stress : Not at all  Social Connections: Moderately Integrated (01/15/2022)   Social Connection and Isolation Panel [NHANES]    Frequency of Communication with Friends and Family: More than three times a week    Frequency of Social Gatherings  with Friends and Family: Not on file    Attends Religious Services: More than 4 times per year    Active Member of Clubs or Organizations: Yes    Attends Banker Meetings: More than 4 times per year    Marital Status: Divorced    Current Medications:  Current Outpatient Medications:    aspirin 81 MG tablet, Take 1 tablet (81 mg total) by mouth daily. (Patient taking differently: Take 81 mg by mouth at bedtime.), Disp: 90 tablet, Rfl: 3   atorvastatin (LIPITOR) 10 MG tablet, Take 1 tablet (10 mg total) by mouth daily., Disp: 90 tablet, Rfl: 3   buPROPion (WELLBUTRIN) 100 MG tablet, TAKE 3 TABLETS BY MOUTH DAILY, Disp: 270 tablet, Rfl: 1   Calcium-Vitamin D-Vitamin K (CALCIUM SOFT CHEWS) 500-500-40 MG-UNT-MCG CHEW, Chew by mouth 2 (two) times daily. 650mg  calcium. 12.5 mcg D vitamin k vitamin. Eat 2 per day  (morning and lunch), Disp: , Rfl:    cetirizine (ZYRTEC) 10 MG tablet, Take 10 mg by mouth at bedtime., Disp: , Rfl:    Cyanocobalamin 1000 MCG  SUBL, Place 1 tablet (1,000 mcg total) under the tongue daily. (Patient taking differently: Place 1 tablet under the tongue 3 (three) times a week. M/W/F), Disp: 90 tablet, Rfl: 3   escitalopram (LEXAPRO) 20 MG tablet, Take 1 tablet (20 mg total) by mouth daily. (Patient taking differently: Take 20 mg by mouth at bedtime.), Disp: 90 tablet, Rfl: 3   FeFum-FePoly-FA-B Cmp-C-Biot (FOLIVANE-PLUS) CAPS, TAKE 1 CAPSULE BY MOUTH EVERY DAY (Patient taking differently: Take 1 capsule by mouth daily after breakfast.), Disp: 90 capsule, Rfl: 3   levalbuterol (XOPENEX HFA) 45 MCG/ACT inhaler, Inhale 2 puffs into the lungs every 6 (six) hours as needed for wheezing., Disp: 1 each, Rfl: 3   Multiple Vitamins-Minerals (MULTIVITAMIN & MINERAL PO), Take 1 capsule by mouth 3 (three) times daily., Disp: , Rfl:    Omega-3 Fatty Acids (FISH OIL) 1000 MG CAPS, Take 1 capsule by mouth 2 (two) times daily., Disp: , Rfl:    Probiotic Product (PROBIOTIC PO), Take 1 capsule by mouth daily. 4 billion live per capsule 1 billion CFU, Disp: , Rfl:    senna-docusate (SENOKOT-S) 8.6-50 MG tablet, Take 2 tablets by mouth at bedtime. For AFTER surgery, do not take if having diarrhea (Patient not taking: Reported on 08/01/2022), Disp: 30 tablet, Rfl: 0   traMADol (ULTRAM) 50 MG tablet, Take 1 tablet (50 mg total) by mouth every 6 (six) hours as needed for severe pain. For AFTER surgery only, do not take and drive (Patient not taking: Reported on 08/01/2022), Disp: 5 tablet, Rfl: 0   vitamin C (ASCORBIC ACID) 500 MG tablet, Take 500 mg by mouth daily with lunch., Disp: , Rfl:   Review of Symptoms: Complete 10-system review is negative except as above in Interval History.  Physical Exam: BP 125/80 (BP Location: Left Arm, Patient Position: Sitting)   Pulse 79   Temp 98.6 F (37 C) (Oral)   Resp 20   Ht 5' 2.99" (1.6 m)   Wt 243 lb 6.4 oz (110.4 kg)   LMP 06/11/2010   SpO2 98%   BMI 43.13 kg/m  General: Alert, oriented, no acute  distress. HEENT: Normocephalic, atraumatic. Neck symmetric without masses. Sclera anicteric.  Chest: Normal work of breathing. Clear to auscultation bilaterally.   Cardiovascular: Regular rate and rhythm, no murmurs. Abdomen: Soft, nontender.    Extremities: Grossly normal range of motion.  Warm, well perfused.  No edema bilaterally. Skin: No rashes or lesions noted. GU: Normal appearing external genitalia without erythema, excoriation, or lesions.  Speculum exam reveals yellow discharge from cervix. No active bleeding. Ligation suture at 3 and 9 o'clock visualized. Exam chaperoned by Terald Sleeper, LPN    Laboratory & Radiologic Studies: FINAL MICROSCOPIC DIAGNOSIS:   A. 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       HSIL extends to the endocervical margins.       Ectocervical margin is negative for dysplasia.   B. ENDO CERVIX, CURETTAGE:       Benign endocervical glands and cervical squamous epithelium.       Negative for glandular hyperplasia, squamous dysplasia, or  malignancy.   ADDENDUM:   Foci of HSIL colonization in the endocervical glands are seen in the  superficial portion of the specimen. The deep soft tissue edge of the  specimen is free of dysplasia.

## 2022-08-28 ENCOUNTER — Encounter: Payer: Self-pay | Admitting: Psychiatry

## 2022-09-16 ENCOUNTER — Other Ambulatory Visit: Payer: Self-pay

## 2022-09-16 ENCOUNTER — Inpatient Hospital Stay: Payer: Medicare HMO | Attending: Psychiatry | Admitting: Psychiatry

## 2022-09-16 VITALS — BP 113/85 | HR 99 | Temp 98.3°F | Resp 20 | Ht 62.99 in | Wt 245.6 lb

## 2022-09-16 DIAGNOSIS — Z9889 Other specified postprocedural states: Secondary | ICD-10-CM

## 2022-09-16 DIAGNOSIS — D069 Carcinoma in situ of cervix, unspecified: Secondary | ICD-10-CM

## 2022-09-16 NOTE — Progress Notes (Unsigned)
Gynecologic Oncology Return Clinic Visit  Date of Service: 09/16/2022 Referring Provider: Janean Sark, MD   Assessment & Plan: Jacqueline Hawkins is a 67 y.o. 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  Postop: -Completed doxycycline prescription as prescribed.  Discharge resolved. - Well-healing on exam.  CIN3: - Reviewed in brief again option for proceeding with hysterectomy at this time for definitive management versus follow-up in 6 months given that she had negative margins.  Confirmed with pathology that deep margin was negative. - Patient wishes to proceed with close follow-up at this time. - Plan for return in 6 months for repeat Pap smear.  Patient would like to complete this initial follow-up with our clinic.  RTC 25mo   [name], MD Gynecologic Oncology   Medical Decision Making I personally spent  TOTAL 15 minutes face-to-face and non-face-to-face in the care of this patient, which includes all pre, intra, and post visit time on the date of service.  ----------------------- Reason for Visit: Postop/Treatment counseling  Treatment History: - 12/29/2007: NILM - 01/02/2009: NILM - 07/18/2009: Cervical polyp: Benign endocervical polyp with chronic cervicitis - 07/04/2011: NILM - 06/19/2015: NILM, HR HPV positive - 10/21/2016: NILM, HR HPV negative - 01/16/2022: NILM, HR HPV positive - 04/30/2022: H SIL, HR HPV positive - 04/30/2022: Colposcopy with ECC CIN-3, 6:00 biopsy suspicious for squamous intraepithelial lesion, 9:00 biopsy CIN-3, 3:00 biopsy CIN-3  Interval History: Patient reports that she is overall doing well.  Her discharge has resolved.  She is complete her antibiotics.  She denies any vaginal bleeding.   Past Medical/Surgical History: Past Medical History:  Diagnosis Date   Asthma, mild intermittent    followed by pcp   CIN III (cervical intraepithelial neoplasia III)    History of diabetes mellitus    AIC's normal since gastric bypass    Hyperlipidemia    Iron deficiency anemia    MDD (major depressive disorder)    OA (osteoarthritis)    knees   OSA on CPAP 1995   (08-01-2022  per pt using cpap nightly)   followed by dr Wynona Neat (pulmonary);  first dx 1995 used cpap , stopped after gastric bypass;  retested 09-04-2019  in epic , severe osa   Venous insufficiency    Wears glasses     Past Surgical History:  Procedure Laterality Date   CERVICAL CONIZATION W/BX N/A 08/06/2022   Procedure: CONIZATION CERVIX WITH BIOPSY;  Surgeon: [name], MD;  Location: [facility] [place];  Service: Gynecology;  Laterality: N/A;   COLONOSCOPY WITH ESOPHAGOGASTRODUODENOSCOPY (EGD)  07/21/2015   dr Adela Lank   DILATION AND CURETTAGE OF UTERUS N/A 08/06/2022   Procedure: ENDOCERVICAL CURETTAGE;  Surgeon: [name], MD;  Location: [facility] [place];  Service: Gynecology;  Laterality: N/A;   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 Daphine Deutscher:    via laparoscopy   TOTAL HIP ARTHROPLASTY Left 1999    Family History  Problem Relation Age of Onset   Thyroid disease Mother    Diabetes Father    Heart disease Father    Breast cancer Maternal Aunt    Stroke Cousin    Colon cancer Neg Hx    Ovarian cancer Neg Hx    Endometrial cancer Neg Hx    Pancreatic cancer Neg Hx    Prostate cancer Neg Hx     Social History   Socioeconomic History   Marital status: Divorced    Spouse name: Not  on file   Number of children: 2   Years of education: Not on file   Highest education level: Associate degree: occupational, Scientist, product/process development, or vocational program  Occupational History   Occupation: customer Sales promotion account executive    Comment: [employer]  Tobacco Use   Smoking status: Former    Years: 2    Types: Cigarettes    Quit date: 1977    Years since quitting: 47.5   Smokeless tobacco: Never  Vaping Use   Vaping Use: Never used  Substance and Sexual Activity   Alcohol use: Yes    Alcohol/week: 7.0  standard drinks of alcohol    Types: 7 Cans of beer per week    Comment: 08-01-2022  per pt one beer per evening   Drug use: Never   Sexual activity: Not Currently  Other Topics Concern   Not on file  Social History Narrative   Not on file   Social Determinants of Health   Financial Resource Strain: Low Risk  (01/15/2022)   Overall Financial Resource Strain (CARDIA)    Difficulty of Paying Living Expenses: Not hard at all  Food Insecurity: No Food Insecurity (01/15/2022)   Hunger Vital Sign    Worried About Running Out of Food in the Last Year: Never true    Ran Out of Food in the Last Year: Never true  Transportation Needs: No Transportation Needs (01/15/2022)   PRAPARE - Administrator, Civil Service (Medical): No    Lack of Transportation (Non-Medical): No  Physical Activity: Sufficiently Active (01/15/2022)   Exercise Vital Sign    Days of Exercise per Week: 5 days    Minutes of Exercise per Session: 30 min  Stress: No Stress Concern Present (01/15/2022)   [employer] of Occupational Health - Occupational Stress Questionnaire    Feeling of Stress : Not at all  Social Connections: Moderately Integrated (01/15/2022)   Social Connection and Isolation Panel [NHANES]    Frequency of Communication with Friends and Family: More than three times a week    Frequency of Social Gatherings with Friends and Family: Not on file    Attends Religious Services: More than 4 times per year    Active Member of [employer] or Organizations: Yes    Attends Engineer, structural: More than 4 times per year    Marital Status: Divorced    Current Medications:  Current Outpatient Medications:    aspirin 81 MG tablet, Take 1 tablet (81 mg total) by mouth daily. (Patient taking differently: Take 81 mg by mouth at bedtime.), Disp: 90 tablet, Rfl: 3   atorvastatin (LIPITOR) 10 MG tablet, Take 1 tablet (10 mg total) by mouth daily., Disp: 90 tablet, Rfl: 3   buPROPion  (WELLBUTRIN) 100 MG tablet, TAKE 3 TABLETS BY MOUTH DAILY, Disp: 270 tablet, Rfl: 1   Calcium-Vitamin D-Vitamin K (CALCIUM SOFT CHEWS) 500-500-40 MG-UNT-MCG CHEW, Chew by mouth 2 (two) times daily. 650mg  calcium. 12.5 mcg D vitamin k vitamin. Eat 2 per day  (morning and lunch), Disp: , Rfl:    cetirizine (ZYRTEC) 10 MG tablet, Take 10 mg by mouth at bedtime., Disp: , Rfl:    Cyanocobalamin 1000 MCG SUBL, Place 1 tablet (1,000 mcg total) under the tongue daily. (Patient taking differently: Place 1 tablet under the tongue 3 (three) times a week. M/W/F), Disp: 90 tablet, Rfl: 3   escitalopram (LEXAPRO) 20 MG tablet, Take 1 tablet (20 mg total) by mouth daily. (Patient taking differently: Take  20 mg by mouth at bedtime.), Disp: 90 tablet, Rfl: 3   FeFum-FePoly-FA-B Cmp-C-Biot (FOLIVANE-PLUS) CAPS, TAKE 1 CAPSULE BY MOUTH EVERY DAY (Patient taking differently: Take 1 capsule by mouth daily after breakfast.), Disp: 90 capsule, Rfl: 3   levalbuterol (XOPENEX HFA) 45 MCG/ACT inhaler, Inhale 2 puffs into the lungs every 6 (six) hours as needed for wheezing., Disp: 1 each, Rfl: 3   Multiple Vitamins-Minerals (MULTIVITAMIN & MINERAL PO), Take 1 capsule by mouth 3 (three) times daily., Disp: , Rfl:    Omega-3 Fatty Acids (FISH OIL) 1000 MG CAPS, Take 1 capsule by mouth 2 (two) times daily., Disp: , Rfl:    Probiotic Product (PROBIOTIC PO), Take 1 capsule by mouth daily. 4 billion live per capsule 1 billion CFU, Disp: , Rfl:    senna-docusate (SENOKOT-S) 8.6-50 MG tablet, Take 2 tablets by mouth at bedtime. For AFTER surgery, do not take if having diarrhea (Patient not taking: Reported on 08/01/2022), Disp: 30 tablet, Rfl: 0   traMADol (ULTRAM) 50 MG tablet, Take 1 tablet (50 mg total) by mouth every 6 (six) hours as needed for severe pain. For AFTER surgery only, do not take and drive (Patient not taking: Reported on 08/01/2022), Disp: 5 tablet, Rfl: 0   vitamin C (ASCORBIC ACID) 500 MG tablet, Take 500 mg by  mouth daily with lunch., Disp: , Rfl:   Review of Symptoms: Complete 10-system review is negative except as above in Interval History.  Physical Exam: BP 113/85 (BP Location: Left Arm, Patient Position: Sitting)   Pulse 99   Temp 98.3 F (36.8 C)   Resp 20   Ht 5' 2.99" (1.6 m)   Wt 245 lb 9.6 oz (111.4 kg)   LMP 06/11/2010   SpO2 100%   BMI 43.52 kg/m  General: Alert, oriented, no acute distress. HEENT: Normocephalic, atraumatic. Neck symmetric without masses. Sclera anicteric.  Chest: Normal work of breathing.  Extremities: Grossly normal range of motion.  Warm, well perfused.  No edema bilaterally. Skin: No rashes or lesions noted. GU: Normal appearing external genitalia without erythema, excoriation, or lesions.  Speculum exam reveals well healing cervix from cone, no abnormal discharge. No active bleeding. Exam chaperoned by Leta Baptist, RN   Laboratory & Radiologic Studies: None

## 2022-09-16 NOTE — Patient Instructions (Signed)
It was a pleasure to see you in clinic today. - Exam shows good healing - Return visit planned for 6 months. We will do a colposcopy and pap smear at that time.  Thank you very much for allowing me to provide care for you today.  I appreciate your confidence in choosing our Gynecologic Oncology team at [facility].  If you have any questions about your visit today please call our office or send [place] a MyChart message and we will get back to you as soon as possible.

## 2022-09-17 ENCOUNTER — Encounter: Payer: Self-pay | Admitting: Psychiatry

## 2022-11-07 ENCOUNTER — Ambulatory Visit (INDEPENDENT_AMBULATORY_CARE_PROVIDER_SITE_OTHER): Payer: Medicare HMO | Admitting: Radiology

## 2022-11-07 VITALS — BP 120/76

## 2022-11-07 DIAGNOSIS — R350 Frequency of micturition: Secondary | ICD-10-CM

## 2022-11-07 MED ORDER — SULFAMETHOXAZOLE-TRIMETHOPRIM 800-160 MG PO TABS
1.0000 | ORAL_TABLET | Freq: Two times a day (BID) | ORAL | 0 refills | Status: DC
Start: 2022-11-07 — End: 2023-01-07

## 2022-11-07 NOTE — Progress Notes (Signed)
SUBJECTIVE: Jacqueline Hawkins is a 67 y.o. female who complains of urinary frequency, urgency, voiding small amounts, odor in urine, no dysuria. Symptoms began 6 days ago. No vaginal bleeding or discharge.   No Known Allergies  Current Outpatient Medications on File Prior to Visit  Medication Sig Dispense Refill   aspirin 81 MG tablet Take 1 tablet (81 mg total) by mouth daily. (Patient taking differently: Take 81 mg by mouth at bedtime.) 90 tablet 3   atorvastatin (LIPITOR) 10 MG tablet Take 1 tablet (10 mg total) by mouth daily. 90 tablet 3   buPROPion (WELLBUTRIN) 100 MG tablet TAKE 3 TABLETS BY MOUTH DAILY 270 tablet 1   Calcium-Vitamin D-Vitamin K (CALCIUM SOFT CHEWS) 500-500-40 MG-UNT-MCG CHEW Chew by mouth 2 (two) times daily. 650mg  calcium. 12.5 mcg D vitamin k vitamin. Eat 2 per day  (morning and lunch)     cetirizine (ZYRTEC) 10 MG tablet Take 10 mg by mouth at bedtime.     Cyanocobalamin 1000 MCG SUBL Place 1 tablet (1,000 mcg total) under the tongue daily. (Patient taking differently: Place 1 tablet under the tongue 3 (three) times a week. M/W/F) 90 tablet 3   escitalopram (LEXAPRO) 20 MG tablet Take 1 tablet (20 mg total) by mouth daily. (Patient taking differently: Take 20 mg by mouth at bedtime.) 90 tablet 3   FeFum-FePoly-FA-B Cmp-C-Biot (FOLIVANE-PLUS) CAPS TAKE 1 CAPSULE BY MOUTH EVERY DAY (Patient taking differently: Take 1 capsule by mouth daily after breakfast.) 90 capsule 3   levalbuterol (XOPENEX HFA) 45 MCG/ACT inhaler Inhale 2 puffs into the lungs every 6 (six) hours as needed for wheezing. 1 each 3   Multiple Vitamins-Minerals (MULTIVITAMIN & MINERAL PO) Take 1 capsule by mouth 3 (three) times daily.     Omega-3 Fatty Acids (FISH OIL) 1000 MG CAPS Take 1 capsule by mouth 2 (two) times daily.     Probiotic Product (PROBIOTIC PO) Take 1 capsule by mouth daily. 4 billion live per capsule 1 billion CFU     vitamin C (ASCORBIC ACID) 500 MG tablet Take 500 mg by  mouth daily with lunch.     No current facility-administered medications on file prior to visit.    Past Medical History:  Diagnosis Date   Asthma, mild intermittent    followed by pcp   CIN III (cervical intraepithelial neoplasia III)    History of diabetes mellitus    AIC's normal since gastric bypass   Hyperlipidemia    Iron deficiency anemia    MDD (major depressive disorder)    OA (osteoarthritis)    knees   OSA on CPAP 1995   (08-01-2022  per pt using cpap nightly)   followed by dr Wynona Neat (pulmonary);  first dx 1995 used cpap , stopped after gastric bypass;  retested 09-04-2019  in epic , severe osa   Venous insufficiency    Wears glasses      OBJECTIVE: Appears well, in no apparent distress.  Vital signs are normal. The abdomen is soft without tenderness, guarding, mass, rebound or organomegaly. No CVA tenderness or inguinal adenopathy noted. Urine dipstick shows positive for nitrates and positive for leukocytes.  Micro exam: 20-40 WBC's per HPF and many+ bacteria.    Blood pressure 120/76, last menstrual period 06/11/2010.    Chaperone offered and declined for exam.  ASSESSMENT/PLAN: 1. Urinary frequency - Urinalysis,Complete w/RFL Culture - sulfamethoxazole-trimethoprim (BACTRIM DS) 800-160 MG tablet; Take 1 tablet by mouth 2 (two) times daily.  Dispense:  10 tablet; Refill: 0    Will send urine culture and sensitivity.  Treatment per orders - also push fluids, avoid bladder irritants. Instructed she may use Pyridium OTC prn. Call or return to clinic prn if these symptoms worsen or fail to improve as anticipated. Pyelo precautions reviewed with patient.   Arlie Solomons B WHNP-BC 3:34 PM 11/07/2022

## 2022-11-08 ENCOUNTER — Other Ambulatory Visit: Payer: Self-pay | Admitting: Radiology

## 2022-11-08 DIAGNOSIS — R350 Frequency of micturition: Secondary | ICD-10-CM | POA: Diagnosis not present

## 2022-11-08 NOTE — Addendum Note (Signed)
Addended by: Rushie Goltz on: 11/08/2022 08:44 AM   Modules accepted: Orders

## 2022-11-11 LAB — URINE CULTURE: MICRO NUMBER:: 15341422

## 2022-11-11 LAB — CULTURE INDICATED

## 2022-11-11 LAB — URINALYSIS, COMPLETE W/RFL CULTURE
Bilirubin Urine: NEGATIVE
Glucose, UA: NEGATIVE
Hgb urine dipstick: NEGATIVE
Hyaline Cast: NONE SEEN /LPF
Ketones, ur: NEGATIVE
Nitrites, Initial: POSITIVE — AB
Protein, ur: NEGATIVE
RBC / HPF: NONE SEEN /HPF (ref 0–2)
Specific Gravity, Urine: 1.01 (ref 1.001–1.035)
pH: 6.5 (ref 5.0–8.0)

## 2022-11-14 DIAGNOSIS — H35363 Drusen (degenerative) of macula, bilateral: Secondary | ICD-10-CM | POA: Diagnosis not present

## 2022-11-14 DIAGNOSIS — H524 Presbyopia: Secondary | ICD-10-CM | POA: Diagnosis not present

## 2022-11-21 DIAGNOSIS — Z1231 Encounter for screening mammogram for malignant neoplasm of breast: Secondary | ICD-10-CM | POA: Diagnosis not present

## 2022-11-21 LAB — HM MAMMOGRAPHY

## 2022-11-22 ENCOUNTER — Encounter: Payer: Self-pay | Admitting: Family Medicine

## 2022-12-23 DIAGNOSIS — Z96642 Presence of left artificial hip joint: Secondary | ICD-10-CM | POA: Diagnosis not present

## 2022-12-23 DIAGNOSIS — M8588 Other specified disorders of bone density and structure, other site: Secondary | ICD-10-CM | POA: Diagnosis not present

## 2022-12-23 LAB — HM DEXA SCAN

## 2022-12-24 ENCOUNTER — Encounter: Payer: Self-pay | Admitting: Family Medicine

## 2022-12-30 ENCOUNTER — Other Ambulatory Visit: Payer: Self-pay | Admitting: Family Medicine

## 2022-12-30 DIAGNOSIS — F3341 Major depressive disorder, recurrent, in partial remission: Secondary | ICD-10-CM

## 2022-12-30 DIAGNOSIS — D509 Iron deficiency anemia, unspecified: Secondary | ICD-10-CM

## 2023-01-07 ENCOUNTER — Ambulatory Visit: Payer: Medicare HMO | Admitting: Family Medicine

## 2023-01-07 ENCOUNTER — Encounter: Payer: Self-pay | Admitting: Family Medicine

## 2023-01-07 VITALS — BP 110/76 | HR 100 | Temp 98.8°F | Resp 16 | Ht 62.99 in | Wt 245.5 lb

## 2023-01-07 DIAGNOSIS — M816 Localized osteoporosis [Lequesne]: Secondary | ICD-10-CM | POA: Diagnosis not present

## 2023-01-07 DIAGNOSIS — F3341 Major depressive disorder, recurrent, in partial remission: Secondary | ICD-10-CM

## 2023-01-07 DIAGNOSIS — Z0000 Encounter for general adult medical examination without abnormal findings: Secondary | ICD-10-CM

## 2023-01-07 DIAGNOSIS — Z23 Encounter for immunization: Secondary | ICD-10-CM | POA: Diagnosis not present

## 2023-01-07 DIAGNOSIS — E785 Hyperlipidemia, unspecified: Secondary | ICD-10-CM | POA: Diagnosis not present

## 2023-01-07 DIAGNOSIS — E1169 Type 2 diabetes mellitus with other specified complication: Secondary | ICD-10-CM | POA: Diagnosis not present

## 2023-01-07 DIAGNOSIS — J4520 Mild intermittent asthma, uncomplicated: Secondary | ICD-10-CM

## 2023-01-07 MED ORDER — ALENDRONATE SODIUM 70 MG PO TABS
70.0000 mg | ORAL_TABLET | ORAL | 3 refills | Status: DC
Start: 2023-01-07 — End: 2023-12-02

## 2023-01-07 MED ORDER — BUPROPION HCL 100 MG PO TABS
300.0000 mg | ORAL_TABLET | Freq: Every day | ORAL | 3 refills | Status: DC
Start: 2023-01-07 — End: 2023-03-07

## 2023-01-07 NOTE — Assessment & Plan Note (Signed)
Problem has been well-controlled since bariatric procedure.  Last hemoglobin A1c 6.1 in 11/2021. Continue non pharmacologic treatment. Further recommendation will be given according to hemoglobin A1c. Annual eye exam, periodic dental and foot care to continue.

## 2023-01-07 NOTE — Assessment & Plan Note (Signed)
Problem is stable. Continue Wellbutrin 100 mg 3 tablets daily and Lexapro 20 mg daily. Follow-up in a year, before if needed.

## 2023-01-07 NOTE — Assessment & Plan Note (Addendum)
We discussed recent DEXA results as well as pharmacologic options and some side effects. She agrees with starting Fosamax 70 mg weekly, planning on 3 to 5 years treatment She follows with dentist regularly, states that she does not need any dental procedure at this time. Continue adequate calcium and vitamin D supplementation. Fall precautions and regular physical activity. DEXA in 2 years.

## 2023-01-07 NOTE — Assessment & Plan Note (Signed)
Last LDL 87 in 11/2021. Continue atorvastatin 10 mg daily and low-fat diet. Further recommendation will be given according to lipid panel results.

## 2023-01-07 NOTE — Patient Instructions (Addendum)
A few things to remember from today's visit:  Routine general medical examination at a health care facility  Controlled type 2 diabetes mellitus with other specified complication, without long-term current use of insulin (HCC) - Plan: Comprehensive metabolic panel, Hemoglobin A1c, Microalbumin / creatinine urine ratio  Hyperlipidemia associated with type 2 diabetes mellitus (HCC) - Plan: Comprehensive metabolic panel, Lipid panel  Major depressive disorder with single episode, in remission (HCC) - Plan: buPROPion (WELLBUTRIN) 100 MG tablet  Localized osteoporosis without current pathological fracture - Plan: VITAMIN D 25 Hydroxy (Vit-D Deficiency, Fractures)  As far as problems are stable annual follow up is appropriate. No changes today.  If you need refills for medications you take chronically, please call your pharmacy. Do not use My Chart to request refills or for acute issues that need immediate attention. If you send a my chart message, it may take a few days to be addressed, specially if I am not in the office.  Please be sure medication list is accurate. If a new problem present, please set up appointment sooner than planned today.

## 2023-01-07 NOTE — Progress Notes (Unsigned)
HPI: Jacqueline Hawkins is a 67 y.o. female with a PMHx significant for DM II, HLD, OSA, OA, depression, and s/p bariatric surgery, among some, who is here today for her routine physical.  Last CPE: 12/07/2021 She reports no new problems since her last visit.  She has recently retired and is now working part time at [employer].   Exercise: She states she is trying to walk 20-30 minutes per day in 10-15 minute increments.  Diet: She is cooking at home, eating vegetables daily, and eating primarily chicken and fish or meatless meals.  Sleep: She sleeps 6-8 hours per night.  Alcohol Use: She drinks 1-2 beers per day.  Smoking: She hasn't smoked since she was a teenager. Vision: UTD on routine vision care. Her last appointment was in 10/2022 Dental: UTD on routine dental care. She goes every 6 months.  She follows regularly with gynecology, next appt in 02/2023.  Immunization History  Administered Date(s) Administered   Fluad Quad(high Dose 65+) 12/04/2020, 01/16/2022   Fluad Trivalent(High Dose 65+) 01/07/2023   Influenza Whole 12/29/2007, 12/20/2008   Influenza,inj,Quad PF,6+ Mos 12/29/2012, 02/10/2014, 12/08/2018, 12/09/2018, 01/29/2020   PFIZER Comirnaty(Gray Top)Covid-19 Tri-Sucrose Vaccine 10/31/2020   PFIZER(Purple Top)SARS-COV-2 Vaccination 06/09/2019, 07/10/2019, 02/09/2020   PNEUMOCOCCAL CONJUGATE-20 01/07/2023   Pneumococcal Polysaccharide-23 03/26/2003, 01/02/2009, 12/04/2020   Td 03/26/2003   Tdap 02/10/2014   Zoster Recombinant(Shingrix) 12/07/2021, 01/16/2022   Health Maintenance  Topic Date Due   Medicare Annual Wellness (AWV)  Never done   HEMOGLOBIN A1C  06/07/2022   Diabetic kidney evaluation - eGFR measurement  12/08/2022   Diabetic kidney evaluation - Urine ACR  12/08/2022   COVID-19 Vaccine (5 - 2023-24 season) 01/23/2023 (Originally 11/24/2022)   OPHTHALMOLOGY EXAM  11/07/2023   FOOT EXAM  01/07/2024   DTaP/Tdap/Td (3 - Td or Tdap) 02/11/2024    MAMMOGRAM  11/20/2024   Colonoscopy  07/20/2025   Pneumonia Vaccine 31+ Years old  Completed   INFLUENZA VACCINE  Completed   DEXA SCAN  Completed   Hepatitis C Screening  Completed   Zoster Vaccines- Shingrix  Completed   HPV VACCINES  Aged Out   Chronic medical problems:   Hyperlipidemia: Currently on atorvastatin 10 mg daily.  Side effects from medication: none Lab Results  Component Value Date   CHOL 157 12/07/2021   HDL 55.90 12/07/2021   LDLCALC 87 12/07/2021   LDLDIRECT 189.9 12/22/2007   TRIG 69.0 12/07/2021   CHOLHDL 3 12/07/2021   Diabetes Mellitus II:  She is on non pharmacologic treatment since bariatric procedure a few years ago, Roux-en-Y gastric bypass. She says she does not check her blood sugar at home.  Eye exam: UTD on routine vision care. Her last appointment was in 10/2022. Foot exam: Over a year ago. Negative for symptoms of hypoglycemia, polyuria, polydipsia, numbness extremities, foot ulcers/trauma  Lab Results  Component Value Date   HGBA1C 6.1 12/07/2021   Lab Results  Component Value Date   MICROALBUR <0.7 12/07/2021   Lab Results  Component Value Date   NA 139 12/07/2021   CL 103 12/07/2021   K 4.1 12/07/2021   CO2 30 12/07/2021   BUN 13 12/07/2021   CREATININE 0.69 12/07/2021   GFR 90.67 12/07/2021   CALCIUM 9.1 12/07/2021   ALBUMIN 3.7 12/07/2021   GLUCOSE 111 (H) 12/07/2021   Osteoporosis, DEXA on 12/23/22, right hip -2.7. She takes calcium and vit D supplements daily.   Anxiety/depression:  She is currently taking bupropion 300 mg  daily and Lexapro 20 mg daily.  Reports symptoms as well controlled.     01/07/2023    8:59 AM 01/16/2022    7:36 AM 12/07/2021    7:31 AM 12/04/2020    9:51 PM 09/21/2019    7:24 AM  Depression screen PHQ 2/9  Decreased Interest 0 0 0 0 1  Down, Depressed, Hopeless 0 0 0 0 0  PHQ - 2 Score 0 0 0 0 1  Altered sleeping 0 0 0 0 0  Tired, decreased energy 0 0 1 0 0  Change in appetite 0 0 1 0 0   Feeling bad or failure about yourself  0 0 1 0 0  Trouble concentrating 0 0 0 0 0  Moving slowly or fidgety/restless 0 0 0 0 0  Suicidal thoughts 0 0 0 0 0  PHQ-9 Score 0 0 3 0 1  Difficult doing work/chores Not difficult at all  Not difficult at all Not difficult at all    Asthma: She says she has not had to use her Xopenex inhaler fin over a year.***  No new concerns today.  Review of Systems  Constitutional:  Negative for appetite change, chills and fever.  HENT:  Negative for mouth sores and sore throat.   Eyes:  Negative for redness and visual disturbance.  Respiratory:  Negative for cough, shortness of breath and wheezing.   Cardiovascular:  Negative for chest pain and leg swelling.  Gastrointestinal:  Negative for abdominal pain, nausea and vomiting.       No changes in bowel habits.  Endocrine: Negative for cold intolerance, heat intolerance, polydipsia, polyphagia and polyuria.  Genitourinary:  Negative for decreased urine volume, dysuria and hematuria.  Musculoskeletal:  Positive for arthralgias. Negative for gait problem and myalgias.  Skin:  Negative for color change and rash.  Allergic/Immunologic: Positive for environmental allergies.  Neurological:  Negative for syncope, weakness and headaches.  Psychiatric/Behavioral:  Negative for confusion and hallucinations.   All other systems reviewed and are negative.  Current Outpatient Medications on File Prior to Visit  Medication Sig Dispense Refill   aspirin 81 MG tablet Take 1 tablet (81 mg total) by mouth daily. (Patient taking differently: Take 81 mg by mouth at bedtime.) 90 tablet 3   atorvastatin (LIPITOR) 10 MG tablet Take 1 tablet (10 mg total) by mouth daily. 90 tablet 3   Calcium-Vitamin D-Vitamin K (CALCIUM SOFT CHEWS) 500-500-40 MG-UNT-MCG CHEW Chew by mouth 2 (two) times daily. 650mg  calcium. 12.5 mcg D vitamin k vitamin. Eat 2 per day  (morning and lunch)     cetirizine (ZYRTEC) 10 MG tablet Take 10 mg  by mouth at bedtime.     Cyanocobalamin 1000 MCG SUBL Place 1 tablet (1,000 mcg total) under the tongue daily. (Patient taking differently: Place 1 tablet under the tongue 3 (three) times a week. M/W/F) 90 tablet 3   escitalopram (LEXAPRO) 20 MG tablet TAKE 1 TABLET BY MOUTH EVERY DAY 90 tablet 3   FeFum-FePoly-FA-B Cmp-C-Biot (FOLIVANE-PLUS) CAPS TAKE 1 CAPSULE BY MOUTH EVERY DAY 90 capsule 3   levalbuterol (XOPENEX HFA) 45 MCG/ACT inhaler Inhale 2 puffs into the lungs every 6 (six) hours as needed for wheezing. 1 each 3   Multiple Vitamins-Minerals (MULTIVITAMIN & MINERAL PO) Take 1 capsule by mouth 3 (three) times daily.     Omega-3 Fatty Acids (FISH OIL) 1000 MG CAPS Take 1 capsule by mouth 2 (two) times daily.     Probiotic Product (PROBIOTIC PO) Take  1 capsule by mouth daily. 4 billion live per capsule 1 billion CFU     vitamin C (ASCORBIC ACID) 500 MG tablet Take 500 mg by mouth daily with lunch.     No current facility-administered medications on file prior to visit.    Past Medical History:  Diagnosis Date   Asthma, mild intermittent    followed by pcp   CIN III (cervical intraepithelial neoplasia III)    History of diabetes mellitus    AIC's normal since gastric bypass   Hyperlipidemia    Iron deficiency anemia    MDD (major depressive disorder)    OA (osteoarthritis)    knees   OSA on CPAP 1995   (08-01-2022  per pt using cpap nightly)   followed by dr Wynona Neat (pulmonary);  first dx 1995 used cpap , stopped after gastric bypass;  retested 09-04-2019  in epic , severe osa   Venous insufficiency    Wears glasses     Past Surgical History:  Procedure Laterality Date   CERVICAL CONIZATION W/BX N/A 08/06/2022   Procedure: CONIZATION CERVIX WITH BIOPSY;  Surgeon: [name], MD;  Location: [facility];  Service: Gynecology;  Laterality: N/A;   COLONOSCOPY WITH ESOPHAGOGASTRODUODENOSCOPY (EGD)  07/21/2015   dr Adela Lank   DILATION AND CURETTAGE OF UTERUS  N/A 08/06/2022   Procedure: ENDOCERVICAL CURETTAGE;  Surgeon: [name], MD;  Location: [facility];  Service: Gynecology;  Laterality: N/A;   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 Daphine Deutscher:    via laparoscopy   TOTAL HIP ARTHROPLASTY Left 1999    No Known Allergies  Family History  Problem Relation Age of Onset   Thyroid disease Mother    Diabetes Father    Heart disease Father    Breast cancer Maternal Aunt    Stroke Cousin    Colon cancer Neg Hx    Ovarian cancer Neg Hx    Endometrial cancer Neg Hx    Pancreatic cancer Neg Hx    Prostate cancer Neg Hx     Social History   Socioeconomic History   Marital status: Divorced    Spouse name: Not on file   Number of children: 2   Years of education: Not on file   Highest education level: Associate degree: occupational, Scientist, product/process development, or vocational program  Occupational History   Occupation: Counsellor    Comment: [employer]  Tobacco Use   Smoking status: Former    Current packs/day: 0.00    Types: Cigarettes    Start date: 1975    Quit date: 1977    Years since quitting: 47.8   Smokeless tobacco: Never  Vaping Use   Vaping status: Never Used  Substance and Sexual Activity   Alcohol use: Yes    Alcohol/week: 7.0 standard drinks of alcohol    Types: 7 Cans of beer per week    Comment: 08-01-2022  per pt one beer per evening   Drug use: Never   Sexual activity: Not Currently  Other Topics Concern   Not on file  Social History Narrative   Not on file   Social Determinants of Health   Financial Resource Strain: Low Risk  (01/06/2023)   Overall Financial Resource Strain (CARDIA)    Difficulty of Paying Living Expenses: Not hard at all  Food Insecurity: No Food Insecurity (01/06/2023)   Hunger Vital Sign    Worried About Running Out of Food in the Last  Year: Never true    Ran Out of Food in the Last Year: Never true  Transportation Needs: No Transportation  Needs (01/06/2023)   PRAPARE - Administrator, Civil Service (Medical): No    Lack of Transportation (Non-Medical): No  Physical Activity: Insufficiently Active (01/06/2023)   Exercise Vital Sign    Days of Exercise per Week: 5 days    Minutes of Exercise per Session: 20 min  Stress: No Stress Concern Present (01/06/2023)   [employer] of Occupational Health - Occupational Stress Questionnaire    Feeling of Stress : Not at all  Social Connections: Moderately Integrated (01/06/2023)   Social Connection and Isolation Panel [NHANES]    Frequency of Communication with Friends and Family: More than three times a week    Frequency of Social Gatherings with Friends and Family: Once a week    Attends Religious Services: More than 4 times per year    Active Member of [employer] or Organizations: Yes    Attends Banker Meetings: More than 4 times per year    Marital Status: Divorced   Vitals:   01/07/23 0853  BP: 110/76  Pulse: 100  Resp: 16  Temp: 98.8 F (37.1 C)  SpO2: 94%   Body mass index is 43.5 kg/m.  Wt Readings from Last 3 Encounters:  01/07/23 245 lb 8 oz (111.4 kg)  09/16/22 245 lb 9.6 oz (111.4 kg)  08/21/22 243 lb 6.4 oz (110.4 kg)   Physical Exam Vitals and nursing note reviewed.  Constitutional:      General: She is not in acute distress.    Appearance: She is well-developed and well-groomed.  HENT:     Head: Normocephalic and atraumatic.     Right Ear: Tympanic membrane, ear canal and external ear normal.     Left Ear: Tympanic membrane, ear canal and external ear normal.     Mouth/Throat:     Mouth: Mucous membranes are moist.     Pharynx: Oropharynx is clear. Uvula midline.  Eyes:     Conjunctiva/sclera: Conjunctivae normal.     Pupils: Pupils are equal, round, and reactive to light.  Neck:     Thyroid: No thyroid mass.  Cardiovascular:     Rate and Rhythm: Normal rate and regular rhythm.     Pulses:          Dorsalis pedis  pulses are 2+ on the right side and 2+ on the left side.     Heart sounds: No murmur heard.    Comments: Trace pitting edema LE, bilateral. Pulmonary:     Effort: Pulmonary effort is normal. No respiratory distress.     Breath sounds: Normal breath sounds.  Abdominal:     Palpations: Abdomen is soft. There is no hepatomegaly or mass.     Tenderness: There is no abdominal tenderness.  Genitourinary:    Comments: Deferred to gyn. Musculoskeletal:     Right lower leg: No edema.     Left lower leg: No edema.     Right foot: No bunion.     Left foot: No bunion.     Comments: No signs of synovitis appreciated.  Feet:     Right foot:     Skin integrity: No ulcer, blister or skin breakdown.     Left foot:     Skin integrity: No ulcer, blister or skin breakdown.  Lymphadenopathy:     Cervical: No cervical adenopathy.     Upper Body:  Right upper body: No supraclavicular adenopathy.     Left upper body: No supraclavicular adenopathy.  Skin:    General: Skin is warm.     Findings: No erythema or rash.  Neurological:     General: No focal deficit present.     Mental Status: She is alert and oriented to person, place, and time.     Cranial Nerves: No cranial nerve deficit.     Coordination: Coordination normal.     Gait: Gait normal.     Deep Tendon Reflexes:     Reflex Scores:      Bicep reflexes are 2+ on the right side and 2+ on the left side.      Patellar reflexes are 2+ on the right side and 2+ on the left side. Psychiatric:        Mood and Affect: Mood and affect normal.    Diabetic Foot Exam - Simple   Simple Foot Form Diabetic Foot exam was performed with the following findings: Yes 01/07/2023  9:26 AM  Visual Inspection See comments: Yes Sensation Testing Intact to touch and monofilament testing bilaterally: Yes Pulse Check Posterior Tibialis and Dorsalis pulse intact bilaterally: Yes Comments Calluses around toes, some hammer toes bilateral.    ASSESSMENT AND  PLAN:  Jacqueline Hawkins was here today for her annual physical examination.  Orders Placed This Encounter  Procedures   Flu Vaccine Trivalent High Dose (Fluad)   Pneumococcal conjugate vaccine 20-valent (Prevnar 20)   Comprehensive metabolic panel   Hemoglobin A1c   Lipid panel   Microalbumin / creatinine urine ratio   VITAMIN D 25 Hydroxy (Vit-D Deficiency, Fractures)   *** Routine general medical examination at a health care facility Assessment & Plan: We discussed the importance of regular physical activity and healthy diet for prevention of chronic illness and/or complications. Preventive guidelines reviewed. Continue her female preventive care with gynecologist. Vaccination: Prevnar 20 and influenza vaccine given today. Ca++ and vit D supplementation to continue. Next CPE in a year.   Controlled type 2 diabetes mellitus with other specified complication, without long-term current use of insulin (HCC) Assessment & Plan: Problem has been well-controlled since bariatric procedure.  Last hemoglobin A1c 6.1 in 11/2021. Continue non pharmacologic treatment. Further recommendation will be given according to hemoglobin A1c. Annual eye exam, periodic dental and foot care to continue.  Orders: -     Comprehensive metabolic panel; Future -     Hemoglobin A1c; Future -     Microalbumin / creatinine urine ratio; Future  Hyperlipidemia associated with type 2 diabetes mellitus (HCC) Assessment & Plan: Last LDL 87 in 11/2021. Continue atorvastatin 10 mg daily and low-fat diet. Further recommendation will be given according to lipid panel results.  Orders: -     Comprehensive metabolic panel; Future -     Lipid panel; Future  Localized osteoporosis without current pathological fracture Assessment & Plan: We discussed recent DEXA results as well as pharmacologic options and some side effects. She agrees with starting Fosamax 70 mg weekly, planning on 3 to 5 years treatment She  follows with dentist regularly, states that she does not need any dental procedure at this time. Continue adequate calcium and vitamin D supplementation. Fall precautions and regular physical activity. DEXA in 2 years.  Orders: -     VITAMIN D 25 Hydroxy (Vit-D Deficiency, Fractures); Future -     Alendronate Sodium; Take 1 tablet (70 mg total) by mouth every 7 (seven) days. Take with a  full glass of water on an empty stomach.  Dispense: 13 tablet; Refill: 3  Need for influenza vaccination -     Flu Vaccine Trivalent High Dose (Fluad)  Need for pneumococcal vaccination -     Pneumococcal conjugate vaccine 20-valent  Depression, major, recurrent, in partial remission (HCC) Assessment & Plan: Problem is stable. Continue Wellbutrin 100 mg 3 tablets daily and Lexapro 20 mg daily. Follow-up in a year, before if needed.  Orders: -     buPROPion HCl; Take 3 tablets (300 mg total) by mouth daily.  Dispense: 270 tablet; Refill: 3  Mild intermittent asthma without complication Assessment & Plan: Problem is well-controlled, she has not needed Xopenex inhaler for acute exacerbation in over a year.   Obesity, morbid, BMI 40.0-49.9 (HCC) Assessment & Plan: Otherwise stable. S/P bariatric surgery. Consistency with healthy diet and physical activity encouraged.    Return in 1 year (on 01/07/2024) for chronic problems, CPE, Labs.  I, Rolla Etienne Wierda, acting as a scribe for Alenah Sarria [place], MD., have documented all relevant documentation on the behalf of Jacqueline Nobel [place], MD, as directed by  Latoyia Tecson [place], MD while in the presence of Cid Agena [place], MD.   I, Lindaann Gradilla [place], MD, have reviewed all documentation for this visit. The documentation on 01/07/23 for the exam, diagnosis, procedures, and orders are all accurate and complete.  Jeremie Abdelaziz G. [place], MD  [facility]. [place] office.

## 2023-01-07 NOTE — Assessment & Plan Note (Signed)
We discussed the importance of regular physical activity and healthy diet for prevention of chronic illness and/or complications. Preventive guidelines reviewed. Continue her female preventive care with gynecologist. Vaccination: Prevnar 20 and influenza vaccine given today. Ca++ and vit D supplementation to continue. Next CPE in a year.

## 2023-01-07 NOTE — Assessment & Plan Note (Signed)
Otherwise stable. S/P bariatric surgery. Consistency with healthy diet and physical activity encouraged.

## 2023-01-07 NOTE — Assessment & Plan Note (Signed)
Problem is well-controlled, she has not needed Xopenex inhaler for acute exacerbation in over a year.

## 2023-01-08 ENCOUNTER — Encounter: Payer: Self-pay | Admitting: Family Medicine

## 2023-01-14 ENCOUNTER — Other Ambulatory Visit (INDEPENDENT_AMBULATORY_CARE_PROVIDER_SITE_OTHER): Payer: Medicare HMO

## 2023-01-14 DIAGNOSIS — M816 Localized osteoporosis [Lequesne]: Secondary | ICD-10-CM

## 2023-01-14 DIAGNOSIS — E785 Hyperlipidemia, unspecified: Secondary | ICD-10-CM | POA: Diagnosis not present

## 2023-01-14 DIAGNOSIS — E1169 Type 2 diabetes mellitus with other specified complication: Secondary | ICD-10-CM

## 2023-01-14 LAB — COMPREHENSIVE METABOLIC PANEL
ALT: 18 U/L (ref 0–35)
AST: 22 U/L (ref 0–37)
Albumin: 3.8 g/dL (ref 3.5–5.2)
Alkaline Phosphatase: 67 U/L (ref 39–117)
BUN: 11 mg/dL (ref 6–23)
CO2: 29 meq/L (ref 19–32)
Calcium: 8.7 mg/dL (ref 8.4–10.5)
Chloride: 102 meq/L (ref 96–112)
Creatinine, Ser: 0.67 mg/dL (ref 0.40–1.20)
GFR: 90.61 mL/min (ref >60.00)
Glucose, Bld: 135 mg/dL — ABNORMAL HIGH (ref 70–99)
Potassium: 4.6 meq/L (ref 3.5–5.1)
Sodium: 139 meq/L (ref 135–145)
Total Bilirubin: 0.5 mg/dL (ref 0.2–1.2)
Total Protein: 6.9 g/dL (ref 6.0–8.3)

## 2023-01-14 LAB — VITAMIN D 25 HYDROXY (VIT D DEFICIENCY, FRACTURES): VITD: 44.04 ng/mL (ref 30.00–100.00)

## 2023-01-14 LAB — LIPID PANEL
Cholesterol: 171 mg/dL (ref 0–200)
HDL: 56.2 mg/dL (ref >39.00)
LDL Cholesterol: 100 mg/dL — ABNORMAL HIGH (ref 0–99)
NonHDL: 114.88
Total CHOL/HDL Ratio: 3
Triglycerides: 72 mg/dL (ref 0.0–149.0)
VLDL: 14.4 mg/dL (ref 0.0–40.0)

## 2023-01-14 LAB — MICROALBUMIN / CREATININE URINE RATIO
Creatinine,U: 64.7 mg/dL
Microalb Creat Ratio: 1.1 mg/g (ref 0.0–30.0)
Microalb, Ur: <0.7 mg/dL (ref 0.0–1.9)

## 2023-01-14 LAB — HEMOGLOBIN A1C: Hgb A1c MFr Bld: 6.2 % (ref 4.6–6.5)

## 2023-01-17 ENCOUNTER — Encounter: Payer: Self-pay | Admitting: Family Medicine

## 2023-01-17 MED ORDER — ATORVASTATIN CALCIUM 20 MG PO TABS: 20.0000 mg | ORAL_TABLET | Freq: Every day | ORAL | 3 refills | Status: DC

## 2023-01-20 ENCOUNTER — Encounter: Payer: Self-pay | Admitting: Family Medicine

## 2023-03-07 ENCOUNTER — Other Ambulatory Visit: Payer: Self-pay

## 2023-03-07 DIAGNOSIS — F3341 Major depressive disorder, recurrent, in partial remission: Secondary | ICD-10-CM

## 2023-03-07 MED ORDER — BUPROPION HCL 100 MG PO TABS: 300.0000 mg | ORAL_TABLET | Freq: Every day | ORAL | 3 refills | Status: DC

## 2023-03-10 ENCOUNTER — Other Ambulatory Visit (HOSPITAL_COMMUNITY)
Admission: RE | Admit: 2023-03-10 | Discharge: 2023-03-10 | Disposition: A | Discharge status: Home / Self Care | Payer: Medicare HMO | Source: Ambulatory Visit | Attending: Psychiatry | Admitting: Psychiatry

## 2023-03-10 ENCOUNTER — Encounter: Payer: Self-pay | Admitting: Psychiatry

## 2023-03-10 ENCOUNTER — Inpatient Hospital Stay: Payer: Medicare HMO | Attending: Psychiatry | Admitting: Psychiatry

## 2023-03-10 VITALS — BP 125/72 | HR 91 | Temp 98.1°F | Resp 19 | Wt 240.0 lb

## 2023-03-10 DIAGNOSIS — Z86002 Personal history of in-situ neoplasm of other and unspecified genital organs: Secondary | ICD-10-CM | POA: Diagnosis not present

## 2023-03-10 DIAGNOSIS — Z01411 Encounter for gynecological examination (general) (routine) with abnormal findings: Secondary | ICD-10-CM | POA: Diagnosis not present

## 2023-03-10 DIAGNOSIS — D069 Carcinoma in situ of cervix, unspecified: Secondary | ICD-10-CM

## 2023-03-10 DIAGNOSIS — Z1151 Encounter for screening for human papillomavirus (HPV): Secondary | ICD-10-CM | POA: Insufficient documentation

## 2023-03-10 NOTE — Progress Notes (Signed)
Gynecologic Oncology Return Clinic Visit  Date of Service: 03/10/2023 Referring Provider: Janean Sark, MD   Assessment & Plan: Jacqueline Hawkins is a 67 y.o. woman with CIN3, s/p CKC, ECC on 08/06/22, negative margins, who presents today for follow-up.  CIN3: - Exam clinically NED - Shortened posterior lip of cervix with tethering to posterior vagina - Pap obtained today. - Discussed continued pap surveillance versus definitive management with hysterectomy. Pt previously desired to avoid hysterectomy if continued reassuring surveillance. Will be challenging to perform additional excisional procedures given shortening of posterior cervix. - If abnormal pap, discussed that she may need to return sooner for colposcopy.   RTC 36mo with general Ob/Gyn unless indicated sooner by pap  [name], MD Gynecologic Oncology   Medical Decision Making I personally spent  TOTAL 20 minutes face-to-face and non-face-to-face in the care of this patient, which includes all pre, intra, and post visit time on the date of service.  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  Denies vaginal bleeding.  Also denies pelvic pain, change in bowel or bladder habits, bloating, unintentional weight loss, issues eating or drinking.     Past Medical/Surgical History: Past  Medical History:  Diagnosis Date   Asthma, mild intermittent    followed by pcp   CIN III (cervical intraepithelial neoplasia III)    History of diabetes mellitus    AIC's normal since gastric bypass   Hyperlipidemia    Iron deficiency anemia    MDD (major depressive disorder)    OA (osteoarthritis)    knees   OSA on CPAP 1995   (08-01-2022  per pt using cpap nightly)   followed by dr Wynona Neat (pulmonary);  first dx 1995 used cpap , stopped after gastric bypass;  retested 09-04-2019  in epic , severe osa   Venous insufficiency    Wears glasses     Past Surgical History:  Procedure Laterality Date   CERVICAL CONIZATION W/BX N/A 08/06/2022   Procedure: CONIZATION CERVIX WITH BIOPSY;  Surgeon: [name], MD;  Location: [facility] [place];  Service: Gynecology;  Laterality: N/A;   COLONOSCOPY WITH ESOPHAGOGASTRODUODENOSCOPY (EGD)  07/21/2015   dr Adela Lank   DILATION AND CURETTAGE OF UTERUS N/A 08/06/2022   Procedure: ENDOCERVICAL CURETTAGE;  Surgeon: [name], MD;  Location: [facility] ;  Service: Gynecology;  Laterality: N/A;   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 Daphine Deutscher:    via laparoscopy   TOTAL HIP ARTHROPLASTY Left 1999    Family History  Problem Relation Age of Onset   Thyroid disease Mother    Diabetes Father    Heart disease Father    Breast cancer Maternal Aunt    Stroke Cousin    Colon cancer Neg Hx    Ovarian cancer Neg Hx    Endometrial cancer Neg Hx    Pancreatic cancer Neg Hx    Prostate cancer Neg Hx  Social History   Socioeconomic History   Marital status: Divorced    Spouse name: Not on file   Number of children: 2   Years of education: Not on file   Highest education level: Associate degree: occupational, Scientist, product/process development, or vocational program  Occupational History   Occupation: Counsellor    Comment: [employer]  Tobacco Use   Smoking status: Former    Current packs/day:  0.00    Types: Cigarettes    Start date: 1975    Quit date: 1977    Years since quitting: 47.9   Smokeless tobacco: Never  Vaping Use   Vaping status: Never Used  Substance and Sexual Activity   Alcohol use: Yes    Alcohol/week: 7.0 standard drinks of alcohol    Types: 7 Cans of beer per week    Comment: 08-01-2022  per pt one beer per evening   Drug use: Never   Sexual activity: Not Currently  Other Topics Concern   Not on file  Social History Narrative   Not on file   Social Drivers of Health   Financial Resource Strain: Low Risk  (01/06/2023)   Overall Financial Resource Strain (CARDIA)    Difficulty of Paying Living Expenses: Not hard at all  Food Insecurity: No Food Insecurity (01/06/2023)   Hunger Vital Sign    Worried About Running Out of Food in the Last Year: Never true    Ran Out of Food in the Last Year: Never true  Transportation Needs: No Transportation Needs (01/06/2023)   PRAPARE - Administrator, Civil Service (Medical): No    Lack of Transportation (Non-Medical): No  Physical Activity: Insufficiently Active (01/06/2023)   Exercise Vital Sign    Days of Exercise per Week: 5 days    Minutes of Exercise per Session: 20 min  Stress: No Stress Concern Present (01/06/2023)   [employer] of Occupational Health - Occupational Stress Questionnaire    Feeling of Stress : Not at all  Social Connections: Moderately Integrated (01/06/2023)   Social Connection and Isolation Panel [NHANES]    Frequency of Communication with Friends and Family: More than three times a week    Frequency of Social Gatherings with Friends and Family: Once a week    Attends Religious Services: More than 4 times per year    Active Member of [employer] or Organizations: Yes    Attends Engineer, structural: More than 4 times per year    Marital Status: Divorced    Current Medications:  Current Outpatient Medications:    alendronate (FOSAMAX) 70 MG tablet, Take 1  tablet (70 mg total) by mouth every 7 (seven) days. Take with a full glass of water on an empty stomach., Disp: 13 tablet, Rfl: 3   aspirin 81 MG tablet, Take 1 tablet (81 mg total) by mouth daily. (Patient taking differently: Take 81 mg by mouth at bedtime.), Disp: 90 tablet, Rfl: 3   atorvastatin (LIPITOR) 20 MG tablet, Take 1 tablet (20 mg total) by mouth daily., Disp: 90 tablet, Rfl: 3   buPROPion (WELLBUTRIN) 100 MG tablet, Take 3 tablets (300 mg total) by mouth daily., Disp: 270 tablet, Rfl: 3   Calcium-Vitamin D-Vitamin K (CALCIUM SOFT CHEWS) 500-500-40 MG-UNT-MCG CHEW, Chew by mouth 2 (two) times daily. 650mg  calcium. 12.5 mcg D vitamin k vitamin. Eat 2 per day  (morning and lunch), Disp: , Rfl:    cetirizine (ZYRTEC) 10 MG tablet, Take 10 mg by mouth  at bedtime., Disp: , Rfl:    Cyanocobalamin 1000 MCG SUBL, Place 1 tablet (1,000 mcg total) under the tongue daily. (Patient taking differently: Place 1 tablet under the tongue 3 (three) times a week. M/W/F), Disp: 90 tablet, Rfl: 3   escitalopram (LEXAPRO) 20 MG tablet, TAKE 1 TABLET BY MOUTH EVERY DAY, Disp: 90 tablet, Rfl: 3   FeFum-FePoly-FA-B Cmp-C-Biot (FOLIVANE-PLUS) CAPS, TAKE 1 CAPSULE BY MOUTH EVERY DAY, Disp: 90 capsule, Rfl: 3   levalbuterol (XOPENEX HFA) 45 MCG/ACT inhaler, Inhale 2 puffs into the lungs every 6 (six) hours as needed for wheezing., Disp: 1 each, Rfl: 3   Multiple Vitamins-Minerals (MULTIVITAMIN & MINERAL PO), Take 1 capsule by mouth 3 (three) times daily., Disp: , Rfl:    Omega-3 Fatty Acids (FISH OIL) 1000 MG CAPS, Take 1 capsule by mouth 2 (two) times daily., Disp: , Rfl:    Probiotic Product (PROBIOTIC PO), Take 1 capsule by mouth daily. 4 billion live per capsule 1 billion CFU, Disp: , Rfl:    vitamin C (ASCORBIC ACID) 500 MG tablet, Take 500 mg by mouth daily with lunch., Disp: , Rfl:   Review of Symptoms: Complete 10-system review is negative except as above in Interval History.  Physical Exam: BP  125/72 (BP Location: Left Arm, Patient Position: Sitting)   Pulse 91   Temp 98.1 F (36.7 C) (Oral)   Resp 19   Wt 240 lb (108.9 kg)   LMP 06/11/2010   SpO2 99%   BMI 42.53 kg/m  General: Alert, oriented, no acute distress. HEENT: Normocephalic, atraumatic. Neck symmetric without masses. Sclera anicteric.  Chest: Normal work of breathing. Clear to auscultation bilaterally.   Cardiovascular: Regular rate and rhythm, no murmurs. Abdomen: Soft, nontender.  Normoactive bowel sounds.  No masses appreciated.   Extremities: Grossly normal range of motion.  Warm, well perfused.  No edema bilaterally. Skin: No rashes or lesions noted. Lymphatics: No cervical, supraclavicular, or inguinal adenopathy. GU: Normal appearing external genitalia without erythema, excoriation, or lesions.  Speculum exam reveals well healed cervix from cone but with granulation appearance of ectocervix, shortened length of posterior lip of cervix which appears tethered to the posterior vagina. Pap smear collected. On bimanual exam, soft cervix. Tethering of cervix posterior to posterior vaginal wall at 6 o'clock. No nodularity or pelvic mass.  Exam chaperoned by Kimberly Swaziland, CMA    Laboratory & Radiologic Studies: None

## 2023-03-10 NOTE — Patient Instructions (Signed)
It was a pleasure to see you in clinic today. - Pap smear collected today - Return visit planned for 6 months unless needed sooner based on pap  Thank you very much for allowing me to provide care for you today.  I appreciate your confidence in choosing our Gynecologic Oncology team at [facility].  If you have any questions about your visit today please call our office or send [place] a MyChart message and we will get back to you as soon as possible.

## 2023-03-18 LAB — CYTOLOGY - PAP
Comment: NEGATIVE
Diagnosis: NEGATIVE
Diagnosis: REACTIVE
High risk HPV: NEGATIVE

## 2023-03-24 ENCOUNTER — Encounter: Payer: Self-pay | Admitting: Psychiatry

## 2023-03-31 ENCOUNTER — Encounter: Payer: Self-pay | Admitting: Family Medicine

## 2023-04-02 ENCOUNTER — Telehealth: Payer: Self-pay

## 2023-04-02 NOTE — Telephone Encounter (Signed)
 Copied from CRM [id number]. Topic: Clinical - Medical Advice >> Apr 02, 2023  9:39 AM Efraim Kaufmann C wrote: Reason for CRM: patient was calling to check on status of response of Mychart message she sent. Please advise with patient when you get a chance. Thank you

## 2023-04-04 NOTE — Telephone Encounter (Signed)
 See my chart encounter.

## 2023-09-05 ENCOUNTER — Encounter: Payer: Self-pay | Admitting: Psychiatry

## 2023-09-08 ENCOUNTER — Inpatient Hospital Stay: Payer: Medicare HMO | Attending: Psychiatry | Admitting: Psychiatry

## 2023-09-08 ENCOUNTER — Ambulatory Visit: Admitting: Obstetrics and Gynecology

## 2023-09-08 ENCOUNTER — Encounter: Payer: Self-pay | Admitting: Psychiatry

## 2023-09-08 VITALS — BP 120/79 | HR 108 | Temp 98.6°F | Resp 17 | Wt 236.4 lb

## 2023-09-08 DIAGNOSIS — D069 Carcinoma in situ of cervix, unspecified: Secondary | ICD-10-CM | POA: Diagnosis not present

## 2023-09-08 NOTE — Progress Notes (Signed)
 Gynecologic Oncology Return Clinic Visit  Date of Service: 09/08/2023 Referring Provider: Emmette Harms, MD   Assessment & Plan: Jacqueline Hawkins is a 68 y.o. woman with CIN3, s/p CKC, ECC on 08/06/22, negative margins, who presents today for follow-up.  CIN3: - Exam clinically NED - Shortened posterior lip of cervix with tethering to posterior vagina, stable from prior - Pap last on 03/10/23 NILM, HPV HR neg - Pt wishes to avoid hysterectomy and continue with surveillance. Will be challenging to perform additional excisional procedures given shortening of posterior cervix. If recurrent dysplasia, would likely require a hysterectomy. - Given normal pap at last exam, okay to resume screen with general Ob/Gyn. Happy to see her back if anything changes.  RTC prn  Derrel Flies, MD Gynecologic Oncology   Medical Decision Making I personally spent  TOTAL 20 minutes face-to-face and non-face-to-face in the care of this patient, which includes all pre, intra, and post visit time on the date of service.  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 Patient reports that she is overall doing well.  Denies vaginal bleeding.  Also denies pelvic pain, change in bowel or bladder habits, bloating, unintentional weight loss, issues eating or drinking.      Past Medical/Surgical History: Past Medical History:  Diagnosis Date   Asthma, mild intermittent    followed by pcp   CIN III (cervical intraepithelial neoplasia III)    History of diabetes mellitus    AIC's normal since gastric bypass   Hyperlipidemia    Iron deficiency anemia    MDD (major depressive disorder)    OA (osteoarthritis)    knees   OSA on CPAP 1995   (08-01-2022  per pt using cpap nightly)   followed by dr Gaynell Keeler (pulmonary);  first dx 1995 used cpap , stopped after gastric bypass;  retested 09-04-2019  in epic , severe osa   Venous insufficiency    Wears glasses     Past Surgical History:  Procedure Laterality Date   CERVICAL CONIZATION W/BX N/A 08/06/2022   Procedure: CONIZATION CERVIX WITH BIOPSY;  Surgeon: Derrel Flies, MD;  Location: [facility] [place];  Service: Gynecology;  Laterality: N/A;   COLONOSCOPY WITH ESOPHAGOGASTRODUODENOSCOPY (EGD)  07/21/2015   dr General Kenner   DILATION AND CURETTAGE OF UTERUS N/A 08/06/2022   Procedure: ENDOCERVICAL CURETTAGE;  Surgeon: Derrel Flies, MD;  Location: [facility] [place];  Service: Gynecology;  Laterality: N/A;   ROUX-EN-Y GASTRIC BYPASS  08/15/2008   @[facility]   by dr Melven Stable. Gaylyn Keas:    via laparoscopy   TOTAL HIP ARTHROPLASTY Left 1999    Family History  Problem Relation Age of Onset   Thyroid  disease Mother    Diabetes Father    Heart disease Father    Breast cancer Maternal Aunt    Stroke Cousin    Colon cancer Neg Hx    Ovarian cancer Neg Hx    Endometrial cancer Neg Hx  Pancreatic cancer Neg Hx    Prostate cancer Neg Hx     Social History   Socioeconomic History   Marital status: Divorced    Spouse name: Not on file   Number of children: 2   Years of education: Not on file   Highest education level: Associate degree: occupational, Scientist, product/process development, or vocational program  Occupational History   Occupation: customer Sales promotion account executive    Comment: [employer]  Tobacco Use    Smoking status: Former    Current packs/day: 0.00    Types: Cigarettes    Start date: 1975    Quit date: 1977    Years since quitting: 48.4   Smokeless tobacco: Never  Vaping Use   Vaping status: Never Used  Substance and Sexual Activity   Alcohol use: Yes    Alcohol/week: 7.0 standard drinks of alcohol    Types: 7 Cans of beer per week    Comment: 08-01-2022  per pt one beer per evening   Drug use: Never   Sexual activity: Not Currently  Other Topics Concern   Not on file  Social History Narrative   Not on file   Social Drivers of Health   Financial Resource Strain: Low Risk  (01/06/2023)   Overall Financial Resource Strain (CARDIA)    Difficulty of Paying Living Expenses: Not hard at all  Food Insecurity: No Food Insecurity (01/06/2023)   Hunger Vital Sign    Worried About Running Out of Food in the Last Year: Never true    Ran Out of Food in the Last Year: Never true  Transportation Needs: No Transportation Needs (01/06/2023)   PRAPARE - Administrator, Civil Service (Medical): No    Lack of Transportation (Non-Medical): No  Physical Activity: Insufficiently Active (01/06/2023)   Exercise Vital Sign    Days of Exercise per Week: 5 days    Minutes of Exercise per Session: 20 min  Stress: No Stress Concern Present (01/06/2023)   [employer] of Occupational Health - Occupational Stress Questionnaire    Feeling of Stress : Not at all  Social Connections: Moderately Integrated (01/06/2023)   Social Connection and Isolation Panel    Frequency of Communication with Friends and Family: More than three times a week    Frequency of Social Gatherings with Friends and Family: Once a week    Attends Religious Services: More than 4 times per year    Active Member of [employer] or Organizations: Yes    Attends Engineer, structural: More than 4 times per year    Marital Status: Divorced    Current Medications:  Current Outpatient Medications:     alendronate  (FOSAMAX ) 70 MG tablet, Take 1 tablet (70 mg total) by mouth every 7 (seven) days. Take with a full glass of water on an empty stomach., Disp: 13 tablet, Rfl: 3   aspirin  81 MG tablet, Take 1 tablet (81 mg total) by mouth daily., Disp: 90 tablet, Rfl: 3   atorvastatin  (LIPITOR) 20 MG tablet, Take 1 tablet (20 mg total) by mouth daily., Disp: 90 tablet, Rfl: 3   buPROPion  (WELLBUTRIN ) 100 MG tablet, Take 3 tablets (300 mg total) by mouth daily., Disp: 270 tablet, Rfl: 3   Calcium -Vitamin D -Vitamin K (CALCIUM  SOFT CHEWS) 500-500-40 MG-UNT-MCG CHEW, Chew by mouth 2 (two) times daily. 650mg  calcium . 12.5 mcg D vitamin 40mcg k vitamin. Eat 2 per day  (morning and lunch), Disp: , Rfl:    cetirizine (ZYRTEC) 10 MG tablet, Take  10 mg by mouth at bedtime., Disp: , Rfl:    Cyanocobalamin  1000 MCG SUBL, Place 1 tablet (1,000 mcg total) under the tongue daily., Disp: 90 tablet, Rfl: 3   escitalopram  (LEXAPRO ) 20 MG tablet, TAKE 1 TABLET BY MOUTH EVERY DAY, Disp: 90 tablet, Rfl: 3   FeFum-FePoly-FA-B Cmp-C-Biot (FOLIVANE-PLUS) CAPS, TAKE 1 CAPSULE BY MOUTH EVERY DAY, Disp: 90 capsule, Rfl: 3   levalbuterol  (XOPENEX  HFA) 45 MCG/ACT inhaler, Inhale 2 puffs into the lungs every 6 (six) hours as needed for wheezing., Disp: 1 each, Rfl: 3   Multiple Vitamins-Minerals (MULTIVITAMIN & MINERAL PO), Take 1 capsule by mouth 3 (three) times daily., Disp: , Rfl:    Omega-3 Fatty Acids (FISH OIL) 1000 MG CAPS, Take 1 capsule by mouth 2 (two) times daily., Disp: , Rfl:    Probiotic Product (PROBIOTIC PO), Take 1 capsule by mouth daily. 4 billion live per capsule 1 billion CFU, Disp: , Rfl:    vitamin C (ASCORBIC ACID) 500 MG tablet, Take 500 mg by mouth daily with lunch., Disp: , Rfl:   Review of Symptoms: Complete 10-system review is negative except as above in Interval History.  Physical Exam: BP 120/79 (BP Location: Left Arm, Patient Position: Sitting)   Pulse (!) 108 Comment: CMA notified  Temp 98.6 F (37  C) (Oral)   Resp 17   Wt 236 lb 6.4 oz (107.2 kg)   LMP 06/11/2010   SpO2 97%   BMI 41.89 kg/m  General: Alert, oriented, no acute distress. HEENT: Normocephalic, atraumatic. Neck symmetric without masses. Sclera anicteric.  Chest: Normal work of breathing. Clear to auscultation bilaterally.   Cardiovascular: Regular rate and rhythm, no murmurs. Abdomen: Soft, nontender.  Normoactive bowel sounds.  No masses appreciated.   Extremities: Grossly normal range of motion.  Warm, well perfused.  No edema bilaterally. Skin: No rashes or lesions noted. Lymphatics: No cervical, supraclavicular, or inguinal adenopathy. GU: Normal appearing external genitalia without erythema, excoriation, or lesions.  Speculum exam reveals well healed cervix from cone but with granulation appearance of ectocervix, unchanged from last visit, shortened length of posterior lip of cervix which appears tethered to the posterior vagina, same as prior. On bimanual exam, soft cervix. Tethering of cervix posterior to posterior vaginal wall at 6 o'clock. No nodularity or pelvic mass.  Exam chaperoned by Kimberly [place], CMA   Laboratory & Radiologic Studies: Pap (03/10/23): NILM, HPV HR neg

## 2023-09-08 NOTE — Patient Instructions (Signed)
 It was a pleasure to see you in clinic today. - Stable exam today - Recommend that you follow-up with Dr. Newt Barefoot office in December and continue pap smear screening with them. Happy to see you back if anything changes.  Thank you very much for allowing me to provide care for you today.  I appreciate your confidence in choosing our Gynecologic Oncology team at [facility].  If you have any questions about your visit today please call our office or send us  a MyChart message and we will get back to you as soon as possible.

## 2023-09-30 ENCOUNTER — Telehealth: Payer: Self-pay

## 2023-09-30 NOTE — Telephone Encounter (Signed)
 Copied from CRM [id number]. Topic: Clinical - Medical Advice >> Sep 30, 2023  9:59 AM Rozanna MATSU wrote: Reason for CRM: PT STATED SHE IS OUT OF SUPPLIES AND THE OLD SUPPLY COMPANY [employer] IS OUT OF NETWORK AND SHE HAS NOT SEEN PROVIDER IN SOME YEARS. I DID SCHEDULE HER AN APPT FOR SEPT 9TH BUT SHE NEEDS SUPPLIES FOR HER CPAP MACHINE AND NOT SURE WHAT COMPANY SHE NEEDS TO GET THESE SUPPLIES FROM. PLEASE REACH OUT TO PATIENT TO LET HER KNOW WHAT SHE NEEDS TO DO.   Pt has not been seen since August 2021 by Madelin Stank, NP. Pt would have to be scheduled as a new pt which is scheduled for 12-02-23 with Dr Neda. Pt can check out [facility] or [email or web address] for supplies as our office can not RX anything until she is seen. I called and spoke to pt. Pt was informed of the message. Pt can no longer use [employer] due to not accepting her insurance. NFN.

## 2023-10-17 ENCOUNTER — Ambulatory Visit (INDEPENDENT_AMBULATORY_CARE_PROVIDER_SITE_OTHER): Admitting: Family Medicine

## 2023-10-17 ENCOUNTER — Encounter: Payer: Self-pay | Admitting: Family Medicine

## 2023-10-17 VITALS — BP 132/80 | HR 100 | Temp 98.3°F | Resp 16 | Ht 62.99 in | Wt 236.0 lb

## 2023-10-17 DIAGNOSIS — J4520 Mild intermittent asthma, uncomplicated: Secondary | ICD-10-CM

## 2023-10-17 DIAGNOSIS — E1169 Type 2 diabetes mellitus with other specified complication: Secondary | ICD-10-CM | POA: Diagnosis not present

## 2023-10-17 DIAGNOSIS — N390 Urinary tract infection, site not specified: Secondary | ICD-10-CM

## 2023-10-17 DIAGNOSIS — F3341 Major depressive disorder, recurrent, in partial remission: Secondary | ICD-10-CM

## 2023-10-17 DIAGNOSIS — R300 Dysuria: Secondary | ICD-10-CM | POA: Diagnosis not present

## 2023-10-17 LAB — POC URINALSYSI DIPSTICK (AUTOMATED)
Bilirubin, UA: NEGATIVE
Blood, UA: NEGATIVE
Glucose, UA: NEGATIVE
Ketones, UA: NEGATIVE
Nitrite, UA: POSITIVE
Protein, UA: NEGATIVE
Spec Grav, UA: 1.01 (ref 1.010–1.025)
Urobilinogen, UA: 0.2 U/dL
pH, UA: 6 (ref 5.0–8.0)

## 2023-10-17 LAB — POCT GLYCOSYLATED HEMOGLOBIN (HGB A1C): HbA1c, POC (prediabetic range): 5.7 % (ref 5.7–6.4)

## 2023-10-17 LAB — MICROALBUMIN / CREATININE URINE RATIO
Creatinine,U: 22.3 mg/dL
Microalb Creat Ratio: UNDETERMINED mg/g (ref 0.0–30.0)
Microalb, Ur: <0.7 mg/dL

## 2023-10-17 MED ORDER — ALBUTEROL SULFATE HFA 108 (90 BASE) MCG/ACT IN AERS
2.0000 | INHALATION_SPRAY | Freq: Four times a day (QID) | RESPIRATORY_TRACT | 0 refills | Status: DC | PRN
Start: 2023-10-17 — End: 2023-11-10

## 2023-10-17 MED ORDER — SULFAMETHOXAZOLE-TRIMETHOPRIM 800-160 MG PO TABS: 1.0000 | ORAL_TABLET | Freq: Two times a day (BID) | ORAL | 0 refills | Status: AC

## 2023-10-17 NOTE — Assessment & Plan Note (Signed)
 Problem has been well-controlled since bariatric procedure.   HgA1C went from 6.2 to 5.7 today. Continue non pharmacologic treatment. Annual eye exam, periodic dental and foot care to continue.

## 2023-10-17 NOTE — Assessment & Plan Note (Signed)
 Seldom exacerbations. Albuterol  inhaler helps, she will continue 1 to 2 puff every 6 hours as needed. Instructed about warning signs.

## 2023-10-17 NOTE — Assessment & Plan Note (Signed)
 Stable wt. S/P bariatric procedure. Consistency with healthy diet and physical activity encouraged.

## 2023-10-17 NOTE — Patient Instructions (Addendum)
 A few things to remember from today's visit:  Dysuria - Plan: POCT Urinalysis Dipstick (Automated), Urine Culture, Urine Culture  Controlled type 2 diabetes mellitus with other specified complication, without long-term current use of insulin (HCC) - Plan: Microalbumin / creatinine urine ratio, POC HgB A1c, Microalbumin / creatinine urine ratio  Asthma, intermittent, uncomplicated - Plan: albuterol  (VENTOLIN  HFA) 108 (90 Base) MCG/ACT inhaler  Urinary tract infection without hematuria, site unspecified - Plan: sulfamethoxazole -trimethoprim  (BACTRIM  DS) 800-160 MG tablet  Depression, major, recurrent, in partial remission (HCC)  Adequate fluid intake, avoid holding urine for long hours, and over the counter Vit C OR cranberry capsules might help.  Today we will treat empirically with antibiotic, which we might need to change when urine culture comes back depending of bacteria susceptibility.  Seek immediate medical attention if severe abdominal pain, vomiting, fever/chills, or worsening symptoms. F/U if symptomatic are not any better after 2-3 days of antibiotic treatment.  If you need refills for medications you take chronically, please call your pharmacy. Do not use My Chart to request refills or for acute issues that need immediate attention. If you send a my chart message, it may take a few days to be addressed, specially if I am not in the office.  Please be sure medication list is accurate. If a new problem present, please set up appointment sooner than planned today.

## 2023-10-17 NOTE — Progress Notes (Signed)
 ACUTE VISIT Chief Complaint  Patient presents with   Urinary Tract Infection    Started Tuesday, urgency, dysuria, and odor.    HPI: Jacqueline Hawkins is a 68 y.o. female with  PMHx significant for DM II, HLD, OSA, OA, depression, and s/p bariatric surgery, who is here today complaining of odorous urine, burning sensation, and urinary urgency starting 3 days ago.  Urinary Tract Infection  This is a new problem. The current episode started in the past 7 days. The problem has been unchanged. The quality of the pain is described as burning. The pain is mild. There has been no fever. She is Not sexually active. There is No history of pyelonephritis. Associated symptoms include urgency. Pertinent negatives include no chills, flank pain, frequency, hesitancy, nausea, sweats or vomiting. She has tried nothing for the symptoms. Her past medical history is significant for recurrent UTIs.   Has not taken any OTC medications/remedies for symptom relief.  Recently decreased her water intake; was previously drinking adequate amounts of water per day.   Negative for hematuria, unusual back pain, chills, fever, vaginal discharge or bleeding.   Diabetes Mellitus II: She is on non pharmacologic treatment since bariatric procedure a few years ago, Roux-en-Y gastric bypass. She has not been checking her sugars at home.  Overall she is eating healthier since retiring.  She is also walking and doing strength training exercises.  She drinks a can of regular coke daily. Negative for symptoms of hypoglycemia, polyuria, polydipsia, numbness extremities, foot ulcers/trauma   Lab Results  Component Value Date   NA 139 01/14/2023   CL 102 01/14/2023   K 4.6 01/14/2023   CO2 29 01/14/2023   BUN 11 01/14/2023   CREATININE 0.67 01/14/2023   GFR 90.61 01/14/2023   CALCIUM  8.7 01/14/2023   ALBUMIN 3.8 01/14/2023   GLUCOSE 135 (H) 01/14/2023   Lab Results  Component Value Date        HGBA1C 6.2 01/14/2023    HGBA1C 6.1 12/07/2021   Lab Results  Component Value Date   HGBA1C 5.7 10/17/2023   HGBA1C 6.2 01/14/2023   HGBA1C 6.1 12/07/2021   HGBA1C 6.0 12/04/2020    Asthma: She reports a hx of asthma, formally diagnosed 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  She has only needed to use her albuterol  inhaler on occasion; she requests a refill today.  Identifies extreme temperatures as a known trigger. She recall an episode of dyspnea on a day there was an 100 degree heat index warning.  Negative for associated wheezing, palpitation, or chest pain.  Currently asymptomatic.  Review of Systems  Constitutional:  Negative for activity change, appetite change, chills and fever.  HENT:  Negative for sore throat.   Respiratory:  Negative for cough and chest tightness.   Cardiovascular:  Negative for chest pain.  Gastrointestinal:  Negative for abdominal pain, nausea and vomiting.  Endocrine: Negative for cold intolerance, heat intolerance and polyphagia.  Genitourinary:  Positive for urgency. Negative for flank pain, frequency and hesitancy.  Neurological:  Negative for syncope, weakness and headaches.  See other pertinent positives and negatives in HPI.  Current Outpatient Medications on File Prior to Visit  Medication Sig Dispense Refill   alendronate  (FOSAMAX ) 70 MG tablet Take 1 tablet (70 mg total) by mouth every 7 (seven) days. Take with a full glass of water on an empty stomach. 13 tablet 3   aspirin  81 MG tablet Take 1 tablet (81 mg total)  by mouth daily. 90 tablet 3   atorvastatin  (LIPITOR) 20 MG tablet Take 1 tablet (20 mg total) by mouth daily. 90 tablet 3   buPROPion  (WELLBUTRIN ) 100 MG tablet Take 3 tablets (300 mg total) by mouth daily. 270 tablet 3   Calcium -Vitamin D -Vitamin K (CALCIUM  SOFT CHEWS) 500-500-40 MG-UNT-MCG CHEW Chew by mouth 2 (two) times daily. 650mg  calcium . 12.5 mcg D vitamin 40mcg k vitamin. Eat 2 per day  (morning and lunch)      cetirizine (ZYRTEC) 10 MG tablet Take 10 mg by mouth at bedtime.     Cyanocobalamin  1000 MCG SUBL Place 1 tablet (1,000 mcg total) under the tongue daily. 90 tablet 3   escitalopram  (LEXAPRO ) 20 MG tablet TAKE 1 TABLET BY MOUTH EVERY DAY 90 tablet 3   Multiple Vitamins-Minerals (MULTIVITAMIN & MINERAL PO) Take 1 capsule by mouth 3 (three) times daily.     Omega-3 Fatty Acids (FISH OIL) 1000 MG CAPS Take 1 capsule by mouth 2 (two) times daily.     No current facility-administered medications on file prior to visit.    Past Medical History:  Diagnosis Date   Asthma, mild intermittent    followed by pcp   Cataract    CIN III (cervical intraepithelial neoplasia III)    Diabetes mellitus without complication (HCC)    History of diabetes mellitus    AIC's normal since gastric bypass   Hyperlipidemia    Iron deficiency anemia    MDD (major depressive disorder)    OA (osteoarthritis)    knees   OSA on CPAP 1995   (08-01-2022  per pt using cpap nightly)   followed by dr neda (pulmonary);  first dx 1995 used cpap , stopped after gastric bypass;  retested 09-04-2019  in epic , severe osa   Sleep apnea 2009   issue resolved after gastric by-pass surgery   Venous insufficiency    Wears glasses    No Known Allergies  Social History   Socioeconomic History   Marital status: Divorced    Spouse name: Not on file   Number of children: 2   Years of education: Not on file   Highest education level: Some college, no degree  Occupational History   Occupation: Counsellor    Comment: [employer]  Tobacco Use   Smoking status: Former    Current packs/day: 0.00    Types: Cigarettes    Start date: 1975    Quit date: 1977    Years since quitting: 48.5   Smokeless tobacco: Never  Vaping Use   Vaping status: Never Used  Substance and Sexual Activity   Alcohol use: Yes    Alcohol/week: 7.0 standard drinks of alcohol    Types: 7 Cans of beer per week    Comment:  08-01-2022  per pt one beer per evening   Drug use: Never   Sexual activity: Not Currently  Other Topics Concern   Not on file  Social History Narrative   Not on file   Social Drivers of Health   Financial Resource Strain: Low Risk  (10/16/2023)   Overall Financial Resource Strain (CARDIA)    Difficulty of Paying Living Expenses: Not very hard  Food Insecurity: No Food Insecurity (10/16/2023)   Hunger Vital Sign    Worried About Running Out of Food in the Last Year: Never true    Ran Out of Food in the Last Year: Never true  Transportation Needs: No Transportation Needs (10/16/2023)   PRAPARE -  Administrator, Civil Service (Medical): No    Lack of Transportation (Non-Medical): No  Physical Activity: Sufficiently Active (10/16/2023)   Exercise Vital Sign    Days of Exercise per Week: 5 days    Minutes of Exercise per Session: 30 min  Stress: No Stress Concern Present (10/16/2023)   [employer] of Occupational Health - Occupational Stress Questionnaire    Feeling of Stress: Not at all  Social Connections: Moderately Integrated (10/16/2023)   Social Connection and Isolation Panel    Frequency of Communication with Friends and Family: More than three times a week    Frequency of Social Gatherings with Friends and Family: Once a week    Attends Religious Services: More than 4 times per year    Active Member of Clubs or Organizations: Yes    Attends Banker Meetings: More than 4 times per year    Marital Status: Divorced   Vitals:   10/17/23 0909  BP: 132/80  Pulse: 100  Resp: 16  Temp: 98.3 F (36.8 C)  SpO2: 98%   Body mass index is 41.82 kg/m.  Physical Exam Vitals and nursing note reviewed.  Constitutional:      General: She is not in acute distress.    Appearance: She is well-developed.  HENT:     Head: Normocephalic and atraumatic.     Mouth/Throat:     Mouth: Mucous membranes are moist.  Eyes:     Conjunctiva/sclera: Conjunctivae  normal.  Cardiovascular:     Rate and Rhythm: Normal rate and regular rhythm.     Pulses:          Dorsalis pedis pulses are 2+ on the right side and 2+ on the left side.  Pulmonary:     Effort: Pulmonary effort is normal. No respiratory distress.     Breath sounds: Normal breath sounds.  Abdominal:     Palpations: Abdomen is soft. There is no mass.     Tenderness: There is no abdominal tenderness. There is no right CVA tenderness or left CVA tenderness.  Musculoskeletal:     Right lower leg: No edema.     Left lower leg: No edema.  Lymphadenopathy:     Cervical: No cervical adenopathy.  Skin:    General: Skin is warm.     Findings: No erythema.  Neurological:     Mental Status: She is alert and oriented to person, place, and time.  Psychiatric:        Mood and Affect: Mood and affect normal.    ASSESSMENT AND PLAN: Jacqueline Hawkins was seen today for dysuria and urinary urgency.  Lab Results  Component Value Date   HGBA1C 5.7 10/17/2023   Lab Results  Component Value Date   MICROALBUR <0.7 10/17/2023   MICROALBUR 0.4 10/27/2019   Controlled type 2 diabetes mellitus with other specified complication, without long-term current use of insulin (HCC) Assessment & Plan: Problem has been well-controlled since bariatric procedure.   HgA1C went from 6.2 to 5.7 today. Continue non pharmacologic treatment. Annual eye exam, periodic dental and foot care to continue.  Orders: -     Microalbumin / creatinine urine ratio; Future -     POCT glycosylated hemoglobin (Hb A1C)  Asthma, intermittent, uncomplicated Assessment & Plan: Seldom exacerbations. Albuterol  inhaler helps, she will continue 1 to 2 puff every 6 hours as needed. Instructed about warning signs.  Orders: -     Albuterol  Sulfate HFA; Inhale 2 puffs into  the lungs every 6 (six) hours as needed for wheezing or shortness of breath.  Dispense: 8 g; Refill: 0  Dysuria Urine dipstick today positive for nitrite, 2+  leukocytes. Urine sent for culture.  -     POCT Urinalysis Dipstick (Automated) -     Urine Culture; Future  Urinary tract infection without hematuria, site unspecified Empiric treatment with Bactrim  DS twice daily x 5 days started today. Will tailor treatment according to urine culture and sensitivity results. Continue adequate hydration. Clearly instructed about warning signs.  -     Sulfamethoxazole -Trimethoprim ; Take 1 tablet by mouth 2 (two) times daily for 5 days.  Dispense: 10 tablet; Refill: 0  Depression, major, recurrent, in partial remission (HCC) Assessment & Plan: Problem is well-controlled. Currently she is on Wellbutrin  100 mg 3 tablets daily and escitalopram  20 mg daily. Reports that she tried to wean off medication last year and symptoms reoccurred. Continue current management.  Obesity, morbid, BMI 40.0-49.9 (HCC) Assessment & Plan: Stable wt. S/P bariatric procedure. Consistency with healthy diet and physical activity encouraged.  Return in 25 weeks (on 04/09/2024) for chronic problems.  I, Vernell Forest, acting as a scribe for Hershel Corkery [place], MD., have documented all relevant documentation on the behalf of Jacqueline Tindol [place], MD, as directed by   while in the presence of Numan Zylstra [place], MD.  I, Duglas Heier [place], MD, have reviewed all documentation for this visit. The documentation on 10/17/23 for the exam, diagnosis, procedures, and orders are all accurate and complete.  Robie Oats G. [place], MD  [facility]. [place] [facility].  Discharge Instructions   None

## 2023-10-17 NOTE — Assessment & Plan Note (Signed)
 Problem is well-controlled. Currently she is on Wellbutrin  100 mg 3 tablets daily and escitalopram  20 mg daily. Reports that she tried to wean off medication last year and symptoms reoccurred. Continue current management.

## 2023-10-19 LAB — URINE CULTURE
MICRO NUMBER:: 16746666
SPECIMEN QUALITY:: ADEQUATE

## 2023-10-23 ENCOUNTER — Ambulatory Visit: Payer: Self-pay | Admitting: Family Medicine

## 2023-11-08 ENCOUNTER — Other Ambulatory Visit: Payer: Self-pay | Admitting: Family Medicine

## 2023-11-08 ENCOUNTER — Encounter: Payer: Self-pay | Admitting: Family Medicine

## 2023-11-08 DIAGNOSIS — J4520 Mild intermittent asthma, uncomplicated: Secondary | ICD-10-CM

## 2023-11-11 ENCOUNTER — Other Ambulatory Visit: Payer: Self-pay | Admitting: Family Medicine

## 2023-11-11 DIAGNOSIS — N390 Urinary tract infection, site not specified: Secondary | ICD-10-CM

## 2023-11-11 MED ORDER — NITROFURANTOIN MONOHYD MACRO 100 MG PO CAPS: 100.0000 mg | ORAL_CAPSULE | Freq: Two times a day (BID) | ORAL | 0 refills | Status: AC

## 2023-11-27 DIAGNOSIS — Z1231 Encounter for screening mammogram for malignant neoplasm of breast: Secondary | ICD-10-CM | POA: Diagnosis not present

## 2023-11-27 LAB — HM MAMMOGRAPHY

## 2023-12-01 ENCOUNTER — Encounter: Payer: Self-pay | Admitting: Family Medicine

## 2023-12-02 ENCOUNTER — Ambulatory Visit: Admitting: Family Medicine

## 2023-12-02 ENCOUNTER — Other Ambulatory Visit: Payer: Self-pay | Admitting: Family Medicine

## 2023-12-02 ENCOUNTER — Ambulatory Visit (INDEPENDENT_AMBULATORY_CARE_PROVIDER_SITE_OTHER): Admitting: Pulmonary Disease

## 2023-12-02 VITALS — BP 106/74 | HR 94 | Ht 62.0 in | Wt 237.0 lb

## 2023-12-02 DIAGNOSIS — M816 Localized osteoporosis [Lequesne]: Secondary | ICD-10-CM

## 2023-12-02 DIAGNOSIS — G4733 Obstructive sleep apnea (adult) (pediatric): Secondary | ICD-10-CM

## 2023-12-02 NOTE — Progress Notes (Signed)
 Jacqueline Hawkins    [id number]    1955/06/22  Primary Care Physician:Jordan, Dickey MATSU, MD  Referring Physician: [place], Betty G, MD [address] [place],  [place] [address]  Chief complaint:   Patient with severe obstructive sleep apnea, on CPAP therapy, compliant with treatment  HPI:  Patient was last seen in the office in 2021 Diagnosed with severe obstructive sleep apnea, has been using CPAP regularly with no significant concerns with CPAP use Continues to tolerate CPAP well, benefiting from CPAP Wakes up feeling rested  Not having any difficulties with her CPAP  Insurance changes mandates referral to a new DME company and needs CPAP supplies  Sleep study from 2021 does reveal severe obstructive sleep apnea with AHI of 65 with O2 nadir of 66% Compliance data shows excellent compliance with optimal control of apneic events Previously diagnosed with central sleep apnea in 1995 Had bariatric surgery, did achieve a lot of weight loss Had a repeat home sleep study in July 2021  Usually goes to bed between 11 and 1 PM Takes about 50 minutes to fall asleep 1-3 awakenings Final wake up time about 7 AM  Wakes up feeling rested  Retired last year Trying to get more active  Her health has remained stable since her last visit  Active medical problems include asthma, emphysema, previous stroke   Outpatient Encounter Medications as of 12/02/2023  Medication Sig   albuterol  (VENTOLIN  HFA) 108 (90 Base) MCG/ACT inhaler TAKE 2 PUFFS BY MOUTH EVERY 6 HOURS AS NEEDED FOR WHEEZE OR SHORTNESS OF BREATH   alendronate  (FOSAMAX ) 70 MG tablet Take 1 tablet (70 mg total) by mouth every 7 (seven) days. Take with a full glass of water on an empty stomach.   aspirin  81 MG tablet Take 1 tablet (81 mg total) by mouth daily.   atorvastatin  (LIPITOR) 20 MG tablet Take 1 tablet (20 mg total) by mouth daily.   buPROPion  (WELLBUTRIN ) 100 MG tablet Take 3 tablets (300 mg total) by mouth  daily.   Calcium -Vitamin D -Vitamin K (CALCIUM  SOFT CHEWS) 500-500-40 MG-UNT-MCG CHEW Chew by mouth 2 (two) times daily. 650mg  calcium . 12.5 mcg D vitamin 40mcg k vitamin. Eat 2 per day  (morning and lunch)   cetirizine (ZYRTEC) 10 MG tablet Take 10 mg by mouth at bedtime.   Cyanocobalamin  1000 MCG SUBL Place 1 tablet (1,000 mcg total) under the tongue daily.   escitalopram  (LEXAPRO ) 20 MG tablet TAKE 1 TABLET BY MOUTH EVERY DAY   Magnesium Glycinate 120 MG CAPS    Multiple Vitamins-Minerals (MULTIVITAMIN & MINERAL PO) Take 1 capsule by mouth 3 (three) times daily.   Omega-3 Fatty Acids (FISH OIL) 1000 MG CAPS Take 1 capsule by mouth 2 (two) times daily.   No facility-administered encounter medications on file as of 12/02/2023.    Allergies as of 12/02/2023   (No Known Allergies)    Past Medical History:  Diagnosis Date   Asthma, mild intermittent    followed by pcp   Cataract    CIN III (cervical intraepithelial neoplasia III)    Diabetes mellitus without complication (HCC)    History of diabetes mellitus    AIC's normal since gastric bypass   Hyperlipidemia    Iron deficiency anemia    MDD (major depressive disorder)    OA (osteoarthritis)    knees   OSA on CPAP 1995   (08-01-2022  per pt using cpap nightly)   followed by dr neda (  pulmonary);  first dx 1995 used cpap , stopped after gastric bypass;  retested 09-04-2019  in epic , severe osa   Sleep apnea 2009   issue resolved after gastric by-pass surgery   Venous insufficiency    Wears glasses     Past Surgical History:  Procedure Laterality Date   CERVICAL CONIZATION W/BX N/A 08/06/2022   Procedure: CONIZATION CERVIX WITH BIOPSY;  Surgeon: Eldonna Mays, MD;  Location: [facility];  Service: Gynecology;  Laterality: N/A;   COLONOSCOPY WITH ESOPHAGOGASTRODUODENOSCOPY (EGD)  07/21/2015   dr leigh   DILATION AND CURETTAGE OF UTERUS N/A 08/06/2022   Procedure: ENDOCERVICAL CURETTAGE;  Surgeon:  Eldonna Mays, MD;  Location: [facility] [place];  Service: Gynecology;  Laterality: N/A;   JOINT REPLACEMENT  1999   total left hip replacement   ROUX-EN-Y GASTRIC BYPASS  08/15/2008   @[facility]   by dr christella. gladis:    via laparoscopy   TOTAL HIP ARTHROPLASTY Left 1999    Family History  Problem Relation Age of Onset   Thyroid  disease Mother    Diabetes Father    Heart disease Father    Breast cancer Maternal Aunt    Stroke Cousin    Cancer Maternal Aunt    Colon cancer Neg Hx    Ovarian cancer Neg Hx    Endometrial cancer Neg Hx    Pancreatic cancer Neg Hx    Prostate cancer Neg Hx     Social History   Socioeconomic History   Marital status: Divorced    Spouse name: Not on file   Number of children: 2   Years of education: Not on file   Highest education level: Some college, no degree  Occupational History   Occupation: Counsellor    Comment: [employer]  Tobacco Use   Smoking status: Former    Current packs/day: 0.00    Types: Cigarettes    Start date: 1975    Quit date: 1977    Years since quitting: 48.7   Smokeless tobacco: Never  Vaping Use   Vaping status: Never Used  Substance and Sexual Activity   Alcohol use: Yes    Alcohol/week: 7.0 standard drinks of alcohol    Types: 7 Cans of beer per week    Comment: 08-01-2022  per pt one beer per evening   Drug use: Never   Sexual activity: Not Currently  Other Topics Concern   Not on file  Social History Narrative   Not on file   Social Drivers of Health   Financial Resource Strain: Low Risk  (10/16/2023)   Overall Financial Resource Strain (CARDIA)    Difficulty of Paying Living Expenses: Not very hard  Food Insecurity: No Food Insecurity (10/16/2023)   Hunger Vital Sign    Worried About Running Out of Food in the Last Year: Never true    Ran Out of Food in the Last Year: Never true  Transportation Needs: No Transportation Needs (10/16/2023)   PRAPARE - Doctor, general practice (Medical): No    Lack of Transportation (Non-Medical): No  Physical Activity: Sufficiently Active (10/16/2023)   Exercise Vital Sign    Days of Exercise per Week: 5 days    Minutes of Exercise per Session: 30 min  Stress: No Stress Concern Present (10/16/2023)   [employer] of Occupational Health - Occupational Stress Questionnaire    Feeling of Stress: Not at all  Social Connections: Moderately Integrated (10/16/2023)  Social Connection and Isolation Panel    Frequency of Communication with Friends and Family: More than three times a week    Frequency of Social Gatherings with Friends and Family: Once a week    Attends Religious Services: More than 4 times per year    Active Member of [employer] or Organizations: Yes    Attends Engineer, structural: More than 4 times per year    Marital Status: Divorced  Catering manager Violence: Not on file    Review of Systems  Respiratory:  Positive for apnea.   Psychiatric/Behavioral:  Positive for sleep disturbance.     Vitals:   12/02/23 0823  BP: 106/74  Pulse: 94  SpO2: 98%     Physical Exam Constitutional:      Appearance: She is obese.  HENT:     Head: Normocephalic.     Mouth/Throat:     Mouth: Mucous membranes are moist.  Eyes:     General: No scleral icterus. Cardiovascular:     Rate and Rhythm: Normal rate and regular rhythm.     Heart sounds: No murmur heard.    No friction rub.  Pulmonary:     Effort: No respiratory distress.     Breath sounds: No stridor. No wheezing or rhonchi.  Musculoskeletal:     Cervical back: No rigidity or tenderness.  Lymphadenopathy:     Cervical: No cervical adenopathy.  Neurological:     Mental Status: She is alert.  Psychiatric:        Mood and Affect: Mood normal.       12/02/2023    8:00 AM 08/02/2019    9:00 AM  Results of the Epworth flowsheet  Sitting and reading 1 3  Watching TV 1 3  Sitting, inactive in a public place (e.g. a theatre or a  meeting) 0 3  As a passenger in a car for an hour without a break 0 2  Lying down to rest in the afternoon when circumstances permit 1 3  Sitting and talking to someone 0 0  Sitting quietly after a lunch without alcohol 1 2  In a car, while stopped for a few minutes in traffic 0 0  Total score 4 16     Data Reviewed: Sleep study from 2021 reviewed showing severe obstructive sleep apnea with AHI of 65, O2 nadir of 88%  Compliance data reviewed - Showing excellent compliance at 100% AutoSet 5-20 with 95 percentile pressure of 12.2, residual AHI of 2.7  Assessment/Plan: Severe obstructive sleep apnea  History of intermittent asthma - Controlled  History of diabetes type 2 - Controlled  Class III obesity  She is compliant with CPAP therapy Continues to benefit from CPAP therapy Wakes up feeling rested  Does require CPAP supplies  Will send prescription to a new DME company for CPAP supplies  Encourage graded activities as tolerated  Encouraged to give us  a call with any significant concerns  Follow-up a year from now  Jennet Epley MD [facility] 12/02/2023, 8:39 AM  CC: [place], Betty G, MD

## 2023-12-02 NOTE — Patient Instructions (Signed)
 I will see you back in a year  We will send a prescription to a medical supply company for CPAP supplies  Continue using his CPAP nightly  Download from the machine shows it is working optimally  Graded exercises as tolerated  Call us  with significant concerns

## 2023-12-11 ENCOUNTER — Telehealth: Payer: Self-pay

## 2023-12-11 NOTE — Telephone Encounter (Signed)
 Called and spoke with pt [employer] received order 9/10 I let her know to give it about another week so [employer] could process order and gave her [employer] number. Pt verbalized understanding. NFN

## 2023-12-11 NOTE — Telephone Encounter (Signed)
 Copied from CRM [id number]. Topic: Clinical - Order For Equipment >> Dec 09, 2023  4:16 PM Isabell A wrote: Reason for CRM: Patient is following up from her last visit, wants to know where she will get her supplies for her CPAP machine - requesting patient coordinator to give her a call back & states its okay to leave a detailed voicemail.  Callback number: [phone]

## 2023-12-20 ENCOUNTER — Other Ambulatory Visit: Payer: Self-pay | Admitting: Family Medicine

## 2023-12-20 DIAGNOSIS — F3341 Major depressive disorder, recurrent, in partial remission: Secondary | ICD-10-CM

## 2024-01-13 DIAGNOSIS — H5213 Myopia, bilateral: Secondary | ICD-10-CM | POA: Diagnosis not present

## 2024-01-23 ENCOUNTER — Telehealth: Admitting: Family Medicine

## 2024-01-23 DIAGNOSIS — R3989 Other symptoms and signs involving the genitourinary system: Secondary | ICD-10-CM | POA: Diagnosis not present

## 2024-01-23 MED ORDER — CEPHALEXIN 500 MG PO CAPS: 500.0000 mg | ORAL_CAPSULE | Freq: Two times a day (BID) | ORAL | 0 refills | Status: AC

## 2024-01-23 NOTE — Patient Instructions (Addendum)
 Jacqueline Hawkins, thank you for joining Jacqueline CHRISTELLA Barefoot, NP for today's virtual visit.  While this provider is not your primary care provider (PCP), if your PCP is located in our provider database this encounter information will be shared with them immediately following your visit.   A [place] MyChart account gives you access to today's visit and all your visits, tests, and labs performed at [facility]  click here if you don't have a [place] MyChart account or go to [email or web address]  Consent: (Patient) Jacqueline Hawkins provided verbal consent for this virtual visit at the beginning of the encounter.  Current Medications:  Current Outpatient Medications:    cephALEXin (KEFLEX) 500 MG capsule, Take 1 capsule (500 mg total) by mouth 2 (two) times daily for 7 days., Disp: 14 capsule, Rfl: 0   albuterol  (VENTOLIN  HFA) 108 (90 Base) MCG/ACT inhaler, TAKE 2 PUFFS BY MOUTH EVERY 6 HOURS AS NEEDED FOR WHEEZE OR SHORTNESS OF BREATH, Disp: 8.5 each, Rfl: 1   alendronate  (FOSAMAX ) 70 MG tablet, TAKE 1 TABLET (70 MG TOTAL) BY MOUTH EVERY 7 DAYS WITH FULL GLASS WATER ON EMPTY STOMACH, Disp: 12 tablet, Rfl: 4   aspirin  81 MG tablet, Take 1 tablet (81 mg total) by mouth daily., Disp: 90 tablet, Rfl: 3   atorvastatin  (LIPITOR) 20 MG tablet, TAKE 1 TABLET BY MOUTH EVERY DAY, Disp: 90 tablet, Rfl: 3   buPROPion  (WELLBUTRIN ) 100 MG tablet, Take 3 tablets (300 mg total) by mouth daily., Disp: 270 tablet, Rfl: 3   Calcium -Vitamin D -Vitamin K (CALCIUM  SOFT CHEWS) 500-500-40 MG-UNT-MCG CHEW, Chew by mouth 2 (two) times daily. 650mg  calcium . 12.5 mcg D vitamin 40mcg k vitamin. Eat 2 per day  (morning and lunch), Disp: , Rfl:    cetirizine (ZYRTEC) 10 MG tablet, Take 10 mg by mouth at bedtime., Disp: , Rfl:    Cyanocobalamin  1000 MCG SUBL, Place 1 tablet (1,000 mcg total) under the tongue daily., Disp: 90 tablet, Rfl: 3   escitalopram  (LEXAPRO ) 20 MG tablet, TAKE 1 TABLET BY MOUTH EVERY DAY,  Disp: 90 tablet, Rfl: 3   Magnesium Glycinate 120 MG CAPS, , Disp: , Rfl:    Multiple Vitamins-Minerals (MULTIVITAMIN & MINERAL PO), Take 1 capsule by mouth 3 (three) times daily., Disp: , Rfl:    Omega-3 Fatty Acids (FISH OIL) 1000 MG CAPS, Take 1 capsule by mouth 2 (two) times daily., Disp: , Rfl:    Medications ordered in this encounter:  Meds ordered this encounter  Medications   cephALEXin (KEFLEX) 500 MG capsule    Sig: Take 1 capsule (500 mg total) by mouth 2 (two) times daily for 7 days.    Dispense:  14 capsule    Refill:  0    Supervising Provider:   BLAISE ALEENE KIDD [[id number]]     *If you need refills on other medications prior to your next appointment, please contact your pharmacy*  Follow-Up: Call back or seek an in-person evaluation if the symptoms worsen or if the condition fails to improve as anticipated.  [facility] [phone]  Other Instructions Urinary Tract Infection, Female A urinary tract infection (UTI) is an infection in your urinary tract. The urinary tract is made up of organs that make, store, and get rid of pee (urine) in your body. These organs include: The kidneys. The ureters. The bladder. The urethra. What are the causes? Most UTIs are caused by germs called bacteria. They may be in or near your genitals. These  germs grow and cause swelling in your urinary tract. What increases the risk? You're more likely to get a UTI if: You're a female. The urethra is shorter in females than in males. You have a soft tube called a catheter that drains your pee. You can't control when you pee or poop. You have trouble peeing because of: A kidney stone. A urinary blockage. A nerve condition that affects your bladder. Not getting enough to drink. You're sexually active. You use a birth control inside your vagina, like spermicide. You're pregnant. You have low levels of the hormone estrogen in your body. You're an older adult. You're also  more likely to get a UTI if you have other health problems. These may include: Diabetes. A weak immune system. Your immune system is your body's defense system. Sickle cell disease. Injury of the spine. What are the signs or symptoms? Symptoms may include: Needing to pee right away. Peeing small amounts often. Pain or burning when you pee. Blood in your pee. Pee that smells bad or odd. Pain in your belly or lower back. You may also: Feel confused. This may be the first symptom in older adults. Vomit. Not feel hungry. Feel tired or easily annoyed. Have a fever or chills. How is this diagnosed? A UTI is diagnosed based on your medical history and an exam. You may also have other tests. These may include: Pee tests. Blood tests. Tests for sexually transmitted infections (STIs). If you've had more than one UTI, you may need to have imaging studies done to find out why you keep getting them. How is this treated? A UTI can be treated by: Taking antibiotics or other medicines. Drinking enough fluid to keep your pee pale yellow. In rare cases, a UTI can cause a very bad condition called sepsis. Sepsis may be treated in the hospital. Follow these instructions at home: Medicines Take your medicines only as told by your health care provider. If you were given antibiotics, take them as told by your provider. Do not stop taking them even if you start to feel better. General instructions Make sure you: Pee often and fully. Do not hold your pee for a long time. Wipe from front to back after you pee or poop. Use each tissue only once when you wipe. Pee after you have sex. Do not douche or use sprays or powders in your genital area. Contact a health care provider if: Your symptoms don't get better after 1-2 days of taking antibiotics. Your symptoms go away and then come back. You have a fever or chills. You vomit or feel like you may vomit. Get help right away if: You have very bad pain  in your back or lower belly. You faint. This information is not intended to replace advice given to you by your health care provider. Make sure you discuss any questions you have with your health care provider. Document Revised: 02/19/2023 Document Reviewed: 06/14/2022 Elsevier Patient Education  [employer].   If you have been instructed to have an in-person evaluation today at a local Urgent Care facility, please use the link below. It will take you to a list of all of our available [facility], including address, phone number and hours of operation. Please do not delay care.  [facility]  If you or a family member do not have a primary care provider, use the link below to schedule a visit and establish care. When you choose a [facility] primary  care physician or advanced practice provider, you gain a long-term partner in health. Find a Primary Care Provider  Learn more about [facility]'s in-office and virtual care options: [facility] - Get Care Now

## 2024-01-23 NOTE — Progress Notes (Signed)
 Virtual Visit Consent   Jacqueline Hawkins, you are scheduled for a virtual visit with a [facility] provider today. Just as with appointments in the office, your consent must be obtained to participate. Your consent will be active for this visit and any virtual visit you may have with one of our providers in the next 365 days. If you have a MyChart account, a copy of this consent can be sent to you electronically.  As this is a virtual visit, video technology does not allow for your provider to perform a traditional examination. This may limit your provider's ability to fully assess your condition. If your provider identifies any concerns that need to be evaluated in person or the need to arrange testing (such as labs, EKG, etc.), we will make arrangements to do so. Although advances in technology are sophisticated, we cannot ensure that it will always work on either your end or our end. If the connection with a video visit is poor, the visit may have to be switched to a telephone visit. With either a video or telephone visit, we are not always able to ensure that we have a secure connection.  By engaging in this virtual visit, you consent to the provision of healthcare and authorize for your insurance to be billed (if applicable) for the services provided during this visit. Depending on your insurance coverage, you may receive a charge related to this service.  I need to obtain your verbal consent now. Are you willing to proceed with your visit today? Jacqueline Hawkins has provided verbal consent on 01/23/2024 for a virtual visit (video or telephone). Jacqueline CHRISTELLA Barefoot, NP  Date: 01/23/2024 12:03 PM   Virtual Visit via Video Note   I, Jacqueline Hawkins, connected with  Jacqueline Hawkins  ([id number], 1955-08-30) on 01/23/24 at 12:00 PM EDT by a video-enabled telemedicine application and verified that I am speaking with the correct person using two identifiers.  Location: Patient: Virtual Visit Location Patient:  Home Provider: Virtual Visit Location Provider: Home Office   I discussed the limitations of evaluation and management by telemedicine and the availability of in person appointments. The patient expressed understanding and agreed to proceed.    History of Present Illness: Jacqueline Hawkins is a 68 y.o. who identifies as a female who was assigned female at birth, and is being seen today for UTI  Onset was yesterday- odor. Reports having a gi bug prior to this starting, diarrhea for a day. Associated symptoms are increased frequency, urgency, burning when voiding Modifying factors are   Denies fevers, chills, pelvic pain, back pain, n/v, or changes in stool Last UTI was in Aug-   Problems:  Patient Active Problem List   Diagnosis Date Noted   Localized osteoporosis without current pathological fracture 01/07/2023   Cervical intraepithelial neoplasia grade 3 08/06/2022   Mixed conductive and sensorineural hearing loss of both ears 05/30/2021   Intussusception (HCC) 09/11/2020   OSA (obstructive sleep apnea) 11/10/2019   Diabetes mellitus type II, controlled (HCC)  09/23/2018   Depression, major, recurrent, in partial remission 09/12/2017   History of bariatric surgery 03/28/2017   Routine general medical examination at a health care facility 06/19/2015   Iron deficiency anemia 06/19/2015   INCONTINENCE 07/31/2009   Asthma, intermittent, uncomplicated 04/20/2008   Venous (peripheral) insufficiency 03/07/2008   Obesity, morbid, BMI 40.0-49.9 (HCC) 02/01/2008   Hyperlipidemia associated with type 2 diabetes mellitus (HCC) 12/29/2007   Depressive disorder 12/29/2007   OSTEOARTHRITIS 11/05/2006  Allergies: No Known Allergies Medications:  Current Outpatient Medications:    albuterol  (VENTOLIN  HFA) 108 (90 Base) MCG/ACT inhaler, TAKE 2 PUFFS BY MOUTH EVERY 6 HOURS AS NEEDED FOR WHEEZE OR SHORTNESS OF BREATH, Disp: 8.5 each, Rfl: 1   alendronate  (FOSAMAX ) 70 MG tablet, TAKE 1 TABLET (70  MG TOTAL) BY MOUTH EVERY 7 DAYS WITH FULL GLASS WATER ON EMPTY STOMACH, Disp: 12 tablet, Rfl: 4   aspirin  81 MG tablet, Take 1 tablet (81 mg total) by mouth daily., Disp: 90 tablet, Rfl: 3   atorvastatin  (LIPITOR) 20 MG tablet, TAKE 1 TABLET BY MOUTH EVERY DAY, Disp: 90 tablet, Rfl: 3   buPROPion  (WELLBUTRIN ) 100 MG tablet, Take 3 tablets (300 mg total) by mouth daily., Disp: 270 tablet, Rfl: 3   Calcium -Vitamin D -Vitamin K (CALCIUM  SOFT CHEWS) 500-500-40 MG-UNT-MCG CHEW, Chew by mouth 2 (two) times daily. 650mg  calcium . 12.5 mcg D vitamin 40mcg k vitamin. Eat 2 per day  (morning and lunch), Disp: , Rfl:    cetirizine (ZYRTEC) 10 MG tablet, Take 10 mg by mouth at bedtime., Disp: , Rfl:    Cyanocobalamin  1000 MCG SUBL, Place 1 tablet (1,000 mcg total) under the tongue daily., Disp: 90 tablet, Rfl: 3   escitalopram  (LEXAPRO ) 20 MG tablet, TAKE 1 TABLET BY MOUTH EVERY DAY, Disp: 90 tablet, Rfl: 3   Magnesium Glycinate 120 MG CAPS, , Disp: , Rfl:    Multiple Vitamins-Minerals (MULTIVITAMIN & MINERAL PO), Take 1 capsule by mouth 3 (three) times daily., Disp: , Rfl:    Omega-3 Fatty Acids (FISH OIL) 1000 MG CAPS, Take 1 capsule by mouth 2 (two) times daily., Disp: , Rfl:   Observations/Objective: Patient is well-developed, well-nourished in no acute distress.  Resting comfortably  at home.  Head is normocephalic, atraumatic.  No labored breathing.  Speech is clear and coherent with logical content.  Patient is alert and oriented at baseline.    Assessment and Plan:  1. Suspected UTI (Primary)  - cephALEXin (KEFLEX) 500 MG capsule; Take 1 capsule (500 mg total) by mouth 2 (two) times daily for 7 days.  Dispense: 14 capsule; Refill: 0   -no other red flags for stone or kidney infection -increase fluids -complete medication as discussed -prevention discussed and on AVS  Reviewed side effects, risks and benefits of medication.    Patient acknowledged agreement and understanding of the plan.    Past Medical, Surgical, Social History, Allergies, and Medications have been Reviewed.   Follow Up Instructions: I discussed the assessment and treatment plan with the patient. The patient was provided an opportunity to ask questions and all were answered. The patient agreed with the plan and demonstrated an understanding of the instructions.  A copy of instructions were sent to the patient via MyChart unless otherwise noted below.    The patient was advised to call back or seek an in-person evaluation if the symptoms worsen or if the condition fails to improve as anticipated.    Jacqueline CHRISTELLA Barefoot, NP

## 2024-02-23 ENCOUNTER — Ambulatory Visit: Admitting: Family Medicine

## 2024-02-23 ENCOUNTER — Encounter: Payer: Self-pay | Admitting: Family Medicine

## 2024-02-23 VITALS — BP 118/80 | HR 72 | Temp 97.9°F | Resp 16 | Ht 62.0 in | Wt 229.8 lb

## 2024-02-23 DIAGNOSIS — M79605 Pain in left leg: Secondary | ICD-10-CM | POA: Diagnosis not present

## 2024-02-23 DIAGNOSIS — Z0000 Encounter for general adult medical examination without abnormal findings: Secondary | ICD-10-CM

## 2024-02-23 DIAGNOSIS — E1169 Type 2 diabetes mellitus with other specified complication: Secondary | ICD-10-CM | POA: Diagnosis not present

## 2024-02-23 DIAGNOSIS — E11A Type 2 diabetes mellitus without complications in remission: Secondary | ICD-10-CM

## 2024-02-23 DIAGNOSIS — E785 Hyperlipidemia, unspecified: Secondary | ICD-10-CM

## 2024-02-23 DIAGNOSIS — Z23 Encounter for immunization: Secondary | ICD-10-CM | POA: Diagnosis not present

## 2024-02-23 LAB — HEMOGLOBIN A1C: Hgb A1c MFr Bld: 6.1 % (ref 4.6–6.5)

## 2024-02-23 LAB — COMPREHENSIVE METABOLIC PANEL WITH GFR
ALT: 77 U/L — ABNORMAL HIGH (ref 0–35)
AST: 54 U/L — ABNORMAL HIGH (ref 0–37)
Albumin: 3.9 g/dL (ref 3.5–5.2)
Alkaline Phosphatase: 56 U/L (ref 39–117)
BUN: 11 mg/dL (ref 6–23)
CO2: 33 meq/L — ABNORMAL HIGH (ref 19–32)
Calcium: 9.2 mg/dL (ref 8.4–10.5)
Chloride: 101 meq/L (ref 96–112)
Creatinine, Ser: 0.62 mg/dL (ref 0.40–1.20)
GFR: 91.6 mL/min (ref >60.00)
Glucose, Bld: 108 mg/dL — ABNORMAL HIGH (ref 70–99)
Potassium: 4.4 meq/L (ref 3.5–5.1)
Sodium: 138 meq/L (ref 135–145)
Total Bilirubin: 0.6 mg/dL (ref 0.2–1.2)
Total Protein: 6.7 g/dL (ref 6.0–8.3)

## 2024-02-23 LAB — LIPID PANEL
Cholesterol: 157 mg/dL (ref 0–200)
HDL: 61.1 mg/dL (ref >39.00)
LDL Cholesterol: 82 mg/dL (ref 0–99)
NonHDL: 96.23
Total CHOL/HDL Ratio: 3
Triglycerides: 70 mg/dL (ref 0.0–149.0)
VLDL: 14 mg/dL (ref 0.0–40.0)

## 2024-02-23 MED ORDER — PREDNISONE 20 MG PO TABS: ORAL_TABLET | ORAL | 0 refills | Status: AC

## 2024-02-23 NOTE — Progress Notes (Unsigned)
 HPI: [name] is a 68 y.o. female with a PMHx significant for DM II, HLD, OSA, OA, depression, and s/p bariatric surgery, among some, who is here today for her routine physical.  Last CPE: 01/07/23. She reports no new problems since her last visit.  She has recently retired and is now working part time at [employer].  *** Discussed the use of AI scribe software for clinical note transcription with the patient, who gave verbal consent to proceed.  History of Present Illness Jacqueline Hawkins is a 68 year old female who presents for a routine physical exam and Pap smear.  She has a history of abnormal Pap smears and underwent a cone procedure in June 2024 due to dysplasia. Follow-up with her gynecologist at six and twelve months post-procedure was completed. She was due for another Pap smear at eighteen months, but her gynecologist is unavailable until June next year, prompting her to seek a Pap smear during this visit. No vaginal discharge or bleeding.  She has not exercised for the past month due to leg pain. The pain occurs when the back of her leg hits a chair, causing pressure in the calf and shin.  Her diet consists mainly of home-cooked meals, and she consumes vegetables daily. She sleeps between six to eight hours per night. She stopped drinking alcohol a year ago due to financial constraints and quit smoking in 1977.  Her last colonoscopy was in 2017. She received both shingles vaccines in 2023 and a pneumonia shot last year.  Current medications include Wellbutrin  100 mg twice daily, Lexapro , a multivitamin, baby aspirin , calcium  with vitamin D  1200 mg, and turmeric. She uses an inhaler as needed, though she has not needed it in the past week.   Immunization History  Administered Date(s) Administered   Fluad Quad(high Dose 65+) 12/04/2020, 01/16/2022   Fluad Trivalent(High Dose 65+) 01/07/2023   Influenza Whole 12/29/2007, 12/20/2008   Influenza,inj,Quad  PF,6+ Mos 12/29/2012, 02/10/2014, 12/08/2018, 12/09/2018, 01/29/2020   PFIZER Comirnaty(Gray Top)Covid-19 Tri-Sucrose Vaccine 10/31/2020   PFIZER(Purple Top)SARS-COV-2 Vaccination 06/09/2019, 07/10/2019, 02/09/2020   PNEUMOCOCCAL CONJUGATE-20 01/07/2023   Pneumococcal Polysaccharide-23 03/26/2003, 01/02/2009, 12/04/2020   Td 03/26/2003   Tdap 02/10/2014   Zoster Recombinant(Shingrix ) 12/07/2021, 01/16/2022   Health Maintenance  Topic Date Due   Medicare Annual Wellness (AWV)  Never done   Influenza Vaccine  10/24/2023   FOOT EXAM  01/07/2024   Diabetic kidney evaluation - eGFR measurement  01/14/2024   DTaP/Tdap/Td (3 - Td or Tdap) 02/11/2024   COVID-19 Vaccine (5 - 2025-26 season) 03/15/2024 (Originally 11/24/2023)   HEMOGLOBIN A1C  04/18/2024   Diabetic kidney evaluation - Urine ACR  10/16/2024   OPHTHALMOLOGY EXAM  12/23/2024   Colonoscopy  07/20/2025   Mammogram  11/26/2025   Pneumococcal Vaccine: 50+ Years  Completed   Bone Density Scan  Completed   Hepatitis C Screening  Completed   Zoster Vaccines- Shingrix   Completed   Meningococcal B Vaccine  Aged Out   Hyperlipidemia:Currently on atorvastatin  10 mg daily.   Lab Results  Component Value Date   CHOL 171 01/14/2023   HDL 56.20 01/14/2023   LDLCALC 100 (H) 01/14/2023   LDLDIRECT 189.9 12/22/2007   TRIG 72.0 01/14/2023   CHOLHDL 3 01/14/2023   Diabetes Mellitus II:  She is on non pharmacologic treatment since bariatric procedure a few years ago, Roux-en-Y gastric bypass. ***  Lab Results  Component Value Date   HGBA1C 5.7 10/17/2023   Lab Results  Component Value Date   MICROALBUR <0.7 10/17/2023   Lab Results  Component Value Date   NA 139 01/14/2023   CL 102 01/14/2023   K 4.6 01/14/2023   CO2 29 01/14/2023   BUN 11 01/14/2023   CREATININE 0.67 01/14/2023   GFR 90.61 01/14/2023   CALCIUM  8.7 01/14/2023   ALBUMIN 3.8 01/14/2023   GLUCOSE 135 (H) 01/14/2023   Osteoporosis, DEXA on 12/23/22, right hip  -2.7. She takes calcium  and vit D supplements daily.  She is on Fosamax  70 mg weekly.  Anxiety/depression:  She is currently taking bupropion  300 mg daily and Lexapro  20 mg daily.  Reports symptoms as well controlled.     02/23/2024    8:48 AM 10/17/2023    9:39 AM 01/07/2023    8:59 AM 01/16/2022    7:36 AM 12/07/2021    7:31 AM  Depression screen PHQ 2/9  Decreased Interest 0 0 0 0 0  Down, Depressed, Hopeless 0 0 0 0 0  PHQ - 2 Score 0 0 0 0 0  Altered sleeping 0 0 0 0 0  Tired, decreased energy 0 0 0 0 1  Change in appetite 0 0 0 0 1  Feeling bad or failure about yourself  0 0 0 0 1  Trouble concentrating 0 0 0 0 0  Moving slowly or fidgety/restless 0 0 0 0 0  Suicidal thoughts 0 0 0 0 0  PHQ-9 Score 0 0  0  0  3   Difficult doing work/chores Not difficult at all Not difficult at all Not difficult at all  Not difficult at all     Data saved with a previous flowsheet row definition      02/23/2024    8:48 AM  GAD 7 : Generalized Anxiety Score  Nervous, Anxious, on Edge 0  Control/stop worrying 0  Worry too much - different things 0  Trouble relaxing 0  Restless 0  Easily annoyed or irritable 0  Afraid - awful might happen 0  Total GAD 7 Score 0  Anxiety Difficulty Not difficult at all     Asthma: She says she has not had to use her Xopenex  inhaler fin over a year.  No new concerns today.  Review of Systems  Constitutional:  Negative for appetite change, chills and fever.  HENT:  Negative for mouth sores and sore throat.   Eyes:  Negative for redness and visual disturbance.  Respiratory:  Negative for cough, shortness of breath and wheezing.   Cardiovascular:  Negative for chest pain and leg swelling.  Gastrointestinal:  Negative for abdominal pain, nausea and vomiting.       No changes in bowel habits.  Endocrine: Negative for cold intolerance, heat intolerance, polydipsia, polyphagia and polyuria.  Genitourinary:  Negative for decreased urine volume,  dysuria and hematuria.  Musculoskeletal:  Positive for arthralgias. Negative for gait problem and myalgias.  Skin:  Negative for color change and rash.  Allergic/Immunologic: Positive for environmental allergies.  Neurological:  Negative for syncope, weakness and headaches.  Psychiatric/Behavioral:  Negative for confusion and hallucinations.   All other systems reviewed and are negative.  Current Outpatient Medications on File Prior to Visit  Medication Sig Dispense Refill   albuterol  (VENTOLIN  HFA) 108 (90 Base) MCG/ACT inhaler TAKE 2 PUFFS BY MOUTH EVERY 6 HOURS AS NEEDED FOR WHEEZE OR SHORTNESS OF BREATH 8.5 each 1   aspirin  81 MG tablet Take 1 tablet (81 mg total) by mouth daily. 90 tablet 3  atorvastatin  (LIPITOR) 20 MG tablet TAKE 1 TABLET BY MOUTH EVERY DAY 90 tablet 3   buPROPion  (WELLBUTRIN ) 100 MG tablet Take 3 tablets (300 mg total) by mouth daily. 270 tablet 3   cetirizine (ZYRTEC) 10 MG tablet Take 10 mg by mouth at bedtime.     Cyanocobalamin  1000 MCG SUBL Place 1 tablet (1,000 mcg total) under the tongue daily. 90 tablet 3   escitalopram  (LEXAPRO ) 20 MG tablet TAKE 1 TABLET BY MOUTH EVERY DAY 90 tablet 3   Magnesium Glycinate 120 MG CAPS      Multiple Vitamins-Minerals (MULTIVITAMIN & MINERAL PO) Take 1 capsule by mouth 3 (three) times daily.     Omega-3 Fatty Acids (FISH OIL) 1000 MG CAPS Take 1 capsule by mouth 2 (two) times daily.     alendronate  (FOSAMAX ) 70 MG tablet TAKE 1 TABLET (70 MG TOTAL) BY MOUTH EVERY 7 DAYS WITH FULL GLASS WATER ON EMPTY STOMACH 12 tablet 4   Calcium -Vitamin D -Vitamin K (CALCIUM  SOFT CHEWS) 500-500-40 MG-UNT-MCG CHEW Chew by mouth 2 (two) times daily. 650mg  calcium . 12.5 mcg D vitamin 40mcg k vitamin. Eat 2 per day  (morning and lunch)     No current facility-administered medications on file prior to visit.    Past Medical History:  Diagnosis Date   Asthma, mild intermittent    followed by pcp   Cataract    CIN III (cervical  intraepithelial neoplasia III)    Diabetes mellitus without complication (HCC)    History of diabetes mellitus    AIC's normal since gastric bypass   Hyperlipidemia    Iron deficiency anemia    MDD (major depressive disorder)    OA (osteoarthritis)    knees   OSA on CPAP 1995   (08-01-2022  per pt using cpap nightly)   followed by dr neda (pulmonary);  first dx 1995 used cpap , stopped after gastric bypass;  retested 09-04-2019  in epic , severe osa   Sleep apnea 2009   issue resolved after gastric by-pass surgery   Venous insufficiency    Wears glasses     Past Surgical History:  Procedure Laterality Date   CERVICAL CONIZATION W/BX N/A 08/06/2022   Procedure: CONIZATION CERVIX WITH BIOPSY;  Surgeon: Eldonna Mays, MD;  Location: [facility] [place];  Service: Gynecology;  Laterality: N/A;   COLONOSCOPY WITH ESOPHAGOGASTRODUODENOSCOPY (EGD)  07/21/2015   dr leigh   DILATION AND CURETTAGE OF UTERUS N/A 08/06/2022   Procedure: ENDOCERVICAL CURETTAGE;  Surgeon: Eldonna Mays, MD;  Location: [facility] [place];  Service: Gynecology;  Laterality: N/A;   JOINT REPLACEMENT  1999   total left hip replacement   ROUX-EN-Y GASTRIC BYPASS  08/15/2008   @[facility]   by dr christella. gladis:    via laparoscopy   TOTAL HIP ARTHROPLASTY Left 1999    No Known Allergies  Family History  Problem Relation Age of Onset   Thyroid  disease Mother    Diabetes Father    Heart disease Father    Breast cancer Maternal Aunt    Stroke Cousin    Cancer Maternal Aunt    Colon cancer Neg Hx    Ovarian cancer Neg Hx    Endometrial cancer Neg Hx    Pancreatic cancer Neg Hx    Prostate cancer Neg Hx     Social History   Socioeconomic History   Marital status: Divorced    Spouse name: Not on file   Number of children: 2   Years of education: Not  on file   Highest education level: Associate degree: occupational, scientist, product/process development, or vocational program  Occupational History   Occupation:  customer sales promotion account executive    Comment: [employer]  Tobacco Use   Smoking status: Former    Current packs/day: 0.00    Types: Cigarettes    Start date: 1975    Quit date: 1977    Years since quitting: 48.9   Smokeless tobacco: Never  Vaping Use   Vaping status: Never Used  Substance and Sexual Activity   Alcohol use: Yes    Alcohol/week: 7.0 standard drinks of alcohol    Types: 7 Cans of beer per week    Comment: 08-01-2022  per pt one beer per evening   Drug use: Never   Sexual activity: Not Currently  Other Topics Concern   Not on file  Social History Narrative   Not on file   Social Drivers of Health   Financial Resource Strain: Low Risk  (02/22/2024)   Overall Financial Resource Strain (CARDIA)    Difficulty of Paying Living Expenses: Not hard at all  Food Insecurity: No Food Insecurity (02/22/2024)   Hunger Vital Sign    Worried About Running Out of Food in the Last Year: Never true    Ran Out of Food in the Last Year: Never true  Transportation Needs: No Transportation Needs (02/22/2024)   PRAPARE - Administrator, Civil Service (Medical): No    Lack of Transportation (Non-Medical): No  Physical Activity: Inactive (02/22/2024)   Exercise Vital Sign    Days of Exercise per Week: 0 days    Minutes of Exercise per Session: Not on file  Stress: No Stress Concern Present (02/22/2024)   [employer] of Occupational Health - Occupational Stress Questionnaire    Feeling of Stress: Not at all  Social Connections: Moderately Integrated (02/22/2024)   Social Connection and Isolation Panel    Frequency of Communication with Friends and Family: More than three times a week    Frequency of Social Gatherings with Friends and Family: Once a week    Attends Religious Services: More than 4 times per year    Active Member of [employer] or Organizations: Yes    Attends Banker Meetings: More than 4 times per year    Marital Status: Divorced    Vitals:   02/23/24 0850  BP: 118/80  Pulse: 72  Resp: 16  Temp: 97.9 F (36.6 C)  SpO2: 99%   Body mass index is 42.03 kg/m.  Wt Readings from Last 3 Encounters:  02/23/24 229 lb 12.8 oz (104.2 kg)  12/02/23 237 lb (107.5 kg)  10/17/23 236 lb (107 kg)   Physical Exam Vitals and nursing note reviewed. Exam conducted with a chaperone present.  Constitutional:      General: She is not in acute distress.    Appearance: She is well-developed and well-groomed.  HENT:     Head: Normocephalic and atraumatic.     Right Ear: Tympanic membrane, ear canal and external ear normal.     Left Ear: Tympanic membrane, ear canal and external ear normal.     Mouth/Throat:     Mouth: Mucous membranes are moist.     Pharynx: Oropharynx is clear. Uvula midline.  Eyes:     Conjunctiva/sclera: Conjunctivae normal.     Pupils: Pupils are equal, round, and reactive to light.  Neck:     Thyroid : No thyroid  mass.  Cardiovascular:     Rate and Rhythm:  Normal rate and regular rhythm.     Pulses:          Dorsalis pedis pulses are 2+ on the right side and 2+ on the left side.     Heart sounds: No murmur heard.    Comments: Trace pitting edema LE, bilateral. Pulmonary:     Effort: Pulmonary effort is normal. No respiratory distress.     Breath sounds: Normal breath sounds.  Abdominal:     Palpations: Abdomen is soft. There is no hepatomegaly or mass.     Tenderness: There is no abdominal tenderness.  Genitourinary:    Comments: *** Musculoskeletal:     Lumbar back: No tenderness or bony tenderness.     Right lower leg: No edema.     Left lower leg: No edema.     Right foot: No bunion.     Left foot: No bunion.  Feet:     Right foot:     Skin integrity: No ulcer, blister or skin breakdown.     Left foot:     Skin integrity: No ulcer, blister or skin breakdown.  Lymphadenopathy:     Cervical: No cervical adenopathy.     Upper Body:     Right upper body: No supraclavicular adenopathy.      Left upper body: No supraclavicular adenopathy.  Skin:    General: Skin is warm.     Findings: No erythema or rash.  Neurological:     General: No focal deficit present.     Mental Status: She is alert and oriented to person, place, and time.     Cranial Nerves: No cranial nerve deficit.     Deep Tendon Reflexes:     Reflex Scores:      Bicep reflexes are 2+ on the right side and 2+ on the left side.      Patellar reflexes are 2+ on the right side and 2+ on the left side.    Comments: Antalgic gait assisted with a cane.  Psychiatric:        Mood and Affect: Mood and affect normal.    Diabetic Foot Exam - Simple   No data filed    ASSESSMENT AND PLAN:  Jacqueline Hawkins was here today for her annual physical examination.  Orders Placed This Encounter  Procedures   Flu vaccine HIGH DOSE PF(Fluzone Trivalent)   Comprehensive metabolic panel with GFR   Hemoglobin A1c   Lipid panel   Routine general medical examination at a health care facility  Type 2 diabetes mellitus in remission -     Hemoglobin A1c; Future  Hyperlipidemia associated with type 2 diabetes mellitus (HCC) -     Comprehensive metabolic panel with GFR; Future -     Lipid panel; Future  Need for vaccination -     Flu vaccine HIGH DOSE PF(Fluzone Trivalent)  Pain of left lower extremity -     predniSONE ; 3 tabs for 3 days, 2 tabs for 3 days, 1 tabs for 3 days, and 1/2 tab for 3 days. Take tables together with breakfast.  Dispense: 20 tablet; Refill: 0  We do not have lab service today, she will come back for fasting labs tomorrow. Return in 1 year (on 02/22/2025).  I, Leonce JINNY Ferry, acting as a scribe for Nhia Heaphy, MD., have documented all relevant documentation on the behalf of Merric Yost, MD, as directed by  Alhaji Mcneal, MD while in the presence of Ezekial Arns, MD.   I,  Sheridan Hew, MD, have reviewed all documentation for this visit. The documentation on 02/23/24 for the exam, diagnosis,  procedures, and orders are all accurate and complete.  Darrian Grzelak G. Refugio Mcconico, MD  [facility]. [place] office.

## 2024-02-23 NOTE — Patient Instructions (Addendum)
 A few things to remember from today's visit:  Routine general medical examination at a health care facility  Type 2 diabetes mellitus in remission - Plan: Hemoglobin A1c, Hemoglobin A1c  Hyperlipidemia associated with type 2 diabetes mellitus (HCC) - Plan: Comprehensive metabolic panel with GFR, Lipid panel, Lipid panel, Comprehensive metabolic panel with GFR  Need for vaccination - Plan: Flu vaccine HIGH DOSE PF(Fluzone Trivalent)  Pain of left lower extremity - Plan: predniSONE  (DELTASONE ) 20 MG tablet  Prednisone  with breakfast and not at the same tome with ibuprofen . If leg pain is still present in 4-6 weeks, ortho evaluation will be the next step.  If you need refills for medications you take chronically, please call your pharmacy. Do not use My Chart to request refills or for acute issues that need immediate attention. If you send a my chart message, it may take a few days to be addressed, specially if I am not in the office.  Please be sure medication list is accurate. If a new problem present, please set up appointment sooner than planned today.

## 2024-02-26 ENCOUNTER — Ambulatory Visit: Payer: Self-pay | Admitting: Family Medicine

## 2024-02-26 DIAGNOSIS — R7401 Elevation of levels of liver transaminase levels: Secondary | ICD-10-CM

## 2024-02-26 NOTE — Assessment & Plan Note (Signed)
 Last LDL 100 in 12/2022. Continue atorvastatin  10 mg daily and low-fat diet. Further recommendation will be given according to lipid panel results.

## 2024-02-26 NOTE — Assessment & Plan Note (Signed)
 Problem has been well-controlled with non pharmacologic treatment since bariatric procedure.   HgA1C 5.7 in 09/2023. Annual eye exam, periodic dental and foot care to continue.

## 2024-02-26 NOTE — Assessment & Plan Note (Signed)
 We discussed the importance of regular physical activity and healthy diet for prevention of chronic illness and/or complications. Preventive guidelines reviewed. Vaccination up to date. Ca++ and vit D supplementation to continue. Last pap smear 02/2023 negative. She follows with gynecologist for her female preventive care. Next CPE in a year.

## 2024-03-03 ENCOUNTER — Encounter: Payer: Self-pay | Admitting: Family Medicine

## 2024-03-03 NOTE — Telephone Encounter (Signed)
 Informed patient about her labs , patient voiced understanding. She did not want to schedule her appointment for her future labs at this she stated she would call back later. Patient stated the referral team has already reached out to her in regards of the [place]  of the Liver and she is going to call them back and make appointment.Patient did not have any other questions.

## 2024-03-05 ENCOUNTER — Ambulatory Visit

## 2024-03-05 VITALS — Ht 62.0 in | Wt 229.0 lb

## 2024-03-05 DIAGNOSIS — Z0000 Encounter for general adult medical examination without abnormal findings: Secondary | ICD-10-CM

## 2024-03-05 NOTE — Progress Notes (Cosign Needed Addendum)
 Chief Complaint  Patient presents with   Medicare Wellness     Subjective:   Jacqueline Hawkins is a 68 y.o. female who presents for a Medicare Annual Wellness Visit.  Visit info / Clinical Intake: Medicare Wellness Visit Type:: Initial Annual Wellness Visit Persons participating in visit and providing information:: patient Medicare Wellness Visit Mode:: Telephone If telephone:: video declined Since this visit was completed virtually, some vitals may be partially provided or unavailable. Missing vitals are due to the limitations of the virtual format.: Documented vitals are patient reported If Telephone or Video please confirm:: I connected with patient using audio/video enable telemedicine. I verified patient identity with two identifiers, discussed telehealth limitations, and patient agreed to proceed. Patient Location:: Home Provider Location:: Office Interpreter Needed?: No Pre-visit prep was completed: yes AWV questionnaire completed by patient prior to visit?: yes Date:: 03/04/24 Living arrangements:: (!) lives alone Patient's Overall Health Status Rating: good Typical amount of pain: some Does pain affect daily life?: no Are you currently prescribed opioids?: no  Dietary Habits and Nutritional Risks How many meals a day?: 3 Eats fruit and vegetables daily?: yes Most meals are obtained by: preparing own meals In the last 2 weeks, have you had any of the following?: none Diabetic:: no  Functional Status Activities of Daily Living (to include ambulation/medication): Independent Ambulation: Independent with device- listed below Home Assistive Devices/Equipment: Eyeglasses; CPAP; Cane Medication Administration: Independent Home Management (perform basic housework or laundry): Independent Manage your own finances?: yes Primary transportation is: driving Concerns about vision?: no *vision screening is required for WTM* Concerns about hearing?: no  Fall Screening Falls in  the past year?: 0 Number of falls in past year: 0 Was there an injury with Fall?: 0 Fall Risk Category Calculator: 0 Patient Fall Risk Level: Low Fall Risk  Fall Risk Patient at Risk for Falls Due to: No Fall Risks Fall risk Follow up: Falls evaluation completed  Home and Transportation Safety: All rugs have non-skid backing?: N/A, no rugs All stairs or steps have railings?: yes Grab bars in the bathtub or shower?: (!) no Have non-skid surface in bathtub or shower?: (!) no Good home lighting?: yes Regular seat belt use?: yes Hospital stays in the last year:: no  Cognitive Assessment Difficulty concentrating, remembering, or making decisions? : no Will 6CIT or Mini Cog be Completed: yes What year is it?: 0 points What month is it?: 0 points Give patient an address phrase to remember (5 components): [address] [place]  About what time is it?: 0 points Count backwards from 20 to 1: 0 points Say the months of the year in reverse: 0 points Repeat the address phrase from earlier: 0 points 6 CIT Score: 0 points  Advance Directives (For Healthcare) Does Patient Have a Medical Advance Directive?: No Would patient like information on creating a medical advance directive?: No - Patient declined  Reviewed/Updated  Reviewed/Updated: Reviewed All (Medical, Surgical, Family, Medications, Allergies, Care Teams, Patient Goals)    Allergies (verified) Patient has no known allergies.   Current Medications (verified) Outpatient Encounter Medications as of 03/05/2024  Medication Sig   albuterol  (VENTOLIN  HFA) 108 (90 Base) MCG/ACT inhaler TAKE 2 PUFFS BY MOUTH EVERY 6 HOURS AS NEEDED FOR WHEEZE OR SHORTNESS OF BREATH   alendronate  (FOSAMAX ) 70 MG tablet TAKE 1 TABLET (70 MG TOTAL) BY MOUTH EVERY 7 DAYS WITH FULL GLASS WATER ON EMPTY STOMACH   aspirin  81 MG tablet Take 1 tablet (81 mg total) by mouth daily.  atorvastatin  (LIPITOR) 20 MG tablet TAKE 1 TABLET BY MOUTH EVERY DAY    buPROPion  (WELLBUTRIN ) 100 MG tablet Take 3 tablets (300 mg total) by mouth daily.   cetirizine (ZYRTEC) 10 MG tablet Take 10 mg by mouth at bedtime.   Cyanocobalamin  1000 MCG SUBL Place 1 tablet (1,000 mcg total) under the tongue daily.   escitalopram  (LEXAPRO ) 20 MG tablet TAKE 1 TABLET BY MOUTH EVERY DAY   Magnesium Glycinate 120 MG CAPS    Multiple Vitamins-Minerals (MULTIVITAMIN & MINERAL PO) Take 1 capsule by mouth 3 (three) times daily.   Omega-3 Fatty Acids (FISH OIL) 1000 MG CAPS Take 1 capsule by mouth 2 (two) times daily.   No facility-administered encounter medications on file as of 03/05/2024.    History: Past Medical History:  Diagnosis Date   Asthma, mild intermittent    followed by pcp   Cataract    CIN III (cervical intraepithelial neoplasia III)    Diabetes mellitus without complication (HCC)    History of diabetes mellitus    AIC's normal since gastric bypass   Hyperlipidemia    Iron deficiency anemia    MDD (major depressive disorder)    OA (osteoarthritis)    knees   OSA on CPAP 1995   (08-01-2022  per pt using cpap nightly)   followed by dr neda (pulmonary);  first dx 1995 used cpap , stopped after gastric bypass;  retested 09-04-2019  in epic , severe osa   Sleep apnea 2009   issue resolved after gastric by-pass surgery   Venous insufficiency    Wears glasses    Past Surgical History:  Procedure Laterality Date   CERVICAL CONIZATION W/BX N/A 08/06/2022   Procedure: CONIZATION CERVIX WITH BIOPSY;  Surgeon: Jacqueline Mays, MD;  Location: [facility];  Service: Gynecology;  Laterality: N/A;   COLONOSCOPY WITH ESOPHAGOGASTRODUODENOSCOPY (EGD)  07/21/2015   dr Jacqueline Hawkins   DILATION AND CURETTAGE OF UTERUS N/A 08/06/2022   Procedure: ENDOCERVICAL CURETTAGE;  Surgeon: Jacqueline Mays, MD;  Location: [facility] [place];  Service: Gynecology;  Laterality: N/A;   JOINT REPLACEMENT  1999   total left hip replacement   ROUX-EN-Y  GASTRIC BYPASS  08/15/2008   @[facility]   by dr Jacqueline. Hawkins:    via laparoscopy   TOTAL HIP ARTHROPLASTY Left 1999   Family History  Problem Relation Age of Onset   Thyroid  disease Mother    Diabetes Father    Heart disease Father    Breast cancer Maternal Aunt    Stroke Cousin    Cancer Maternal Aunt    Colon cancer Neg Hx    Ovarian cancer Neg Hx    Endometrial cancer Neg Hx    Pancreatic cancer Neg Hx    Prostate cancer Neg Hx    Social History   Occupational History   Occupation: counsellor    Comment: [employer]  Tobacco Use   Smoking status: Former    Current packs/day: 0.00    Types: Cigarettes    Start date: 1975    Quit date: 1977    Years since quitting: 48.9   Smokeless tobacco: Never  Vaping Use   Vaping status: Never Used  Substance and Sexual Activity   Alcohol use: Yes    Alcohol/week: 7.0 standard drinks of alcohol    Types: 7 Cans of beer per week    Comment: 08-01-2022  per pt one beer per evening   Drug use: Never   Sexual activity:  Not Currently   Tobacco Counseling Counseling given: No  SDOH Screenings   Food Insecurity: No Food Insecurity (03/05/2024)  Housing: Unknown (03/05/2024)  Transportation Needs: No Transportation Needs (03/05/2024)  Utilities: Not At Risk (03/05/2024)  Alcohol Screen: Low Risk (01/06/2023)  Depression (PHQ2-9): Low Risk (03/05/2024)  Financial Resource Strain: Low Risk (02/22/2024)  Physical Activity: Inactive (03/05/2024)  Social Connections: Moderately Integrated (03/05/2024)  Stress: No Stress Concern Present (03/05/2024)  Tobacco Use: Medium Risk (03/05/2024)  Health Literacy: Adequate Health Literacy (03/05/2024)   See flowsheets for full screening details  Depression Screen PHQ 2 & 9 Depression Scale- Over the past 2 weeks, how often have you been bothered by any of the following problems? Little interest or pleasure in doing things: 0 Feeling down, depressed, or hopeless (PHQ  Adolescent also includes...irritable): 0 PHQ-2 Total Score: 0 Trouble falling or staying asleep, or sleeping too much: 0 Feeling tired or having little energy: 0 Poor appetite or overeating (PHQ Adolescent also includes...weight loss): 0 Feeling bad about yourself - or that you are a failure or have let yourself or your family down: 0 Trouble concentrating on things, such as reading the newspaper or watching television (PHQ Adolescent also includes...like school work): 0 Moving or speaking so slowly that other people could have noticed. Or the opposite - being so fidgety or restless that you have been moving around a lot more than usual: 0 Thoughts that you would be better off dead, or of hurting yourself in some way: 0 PHQ-9 Total Score: 0 If you checked off any problems, how difficult have these problems made it for you to do your work, take care of things at home, or get along with other people?: Not difficult at all     Goals Addressed               This Visit's Progress     Increase physical activity (pt-stated)        Get more active.             Objective:    Today's Vitals   03/05/24 0818  Weight: 229 lb (103.9 kg)  Height: 5' 2 (1.575 m)   Body mass index is 41.88 kg/m.  Hearing/Vision screen Hearing Screening - Comments:: Denies hearing difficulties   Vision Screening - Comments:: Wears rx glasses - up to date with routine eye exams with [facility]  Immunizations and Health Maintenance Health Maintenance  Topic Date Due   DTaP/Tdap/Td (3 - Td or Tdap) 02/11/2024   COVID-19 Vaccine (5 - 2025-26 season) 03/15/2024 (Originally 11/24/2023)   HEMOGLOBIN A1C  08/23/2024   Diabetic kidney evaluation - Urine ACR  10/16/2024   OPHTHALMOLOGY EXAM  12/23/2024   Diabetic kidney evaluation - eGFR measurement  02/22/2025   FOOT EXAM  02/22/2025   Medicare Annual Wellness (AWV)  03/05/2025   Colonoscopy  07/20/2025   Mammogram  11/26/2025   Pneumococcal  Vaccine: 50+ Years  Completed   Influenza Vaccine  Completed   Bone Density Scan  Completed   Hepatitis C Screening  Completed   Zoster Vaccines- Shingrix   Completed   Meningococcal B Vaccine  Aged Out        Assessment/Plan:  This is a routine wellness examination for Jacqueline Hawkins.  Patient Care Team: Jordan, Betty G, MD as PCP - General (Family Medicine)  I have personally reviewed and noted the following in the patients chart:   Medical and social history Use of alcohol, tobacco or illicit drugs  Current medications and supplements including opioid prescriptions. Functional ability and status Nutritional status Physical activity Advanced directives List of other physicians Hospitalizations, surgeries, and ER visits in previous 12 months Vitals Screenings to include cognitive, depression, and falls Referrals and appointments  No orders of the defined types were placed in this encounter.  In addition, I have reviewed and discussed with patient certain preventive protocols, quality metrics, and best practice recommendations. A written personalized care plan for preventive services as well as general preventive health recommendations were provided to patient.   Rojelio LELON Blush, LPN   [id number]/[id number]   Return in 53 weeks (on 03/11/2025).  After Visit Summary: (MyChart) Due to this being a telephonic visit, the after visit summary with patients personalized plan was offered to patient via MyChart   Nurse Notes: No voiced or noted concerns at this time

## 2024-03-05 NOTE — Patient Instructions (Addendum)
 Jacqueline Hawkins,  Thank you for taking the time for your Medicare Wellness Visit. I appreciate your continued commitment to your health goals. Please review the care plan we discussed, and feel free to reach out if I can assist you further.  Please note that Annual Wellness Visits do not include a physical exam. Some assessments may be limited, especially if the visit was conducted virtually. If needed, we may recommend an in-person follow-up with your provider.  Ongoing Care Seeing your primary care provider every 3 to 6 months helps us  monitor your health and provide consistent, personalized care.   Referrals If a referral was made during today's visit and you haven't received any updates within two weeks, please contact the referred provider directly to check on the status.  Recommended Screenings:  Health Maintenance  Topic Date Due   DTaP/Tdap/Td vaccine (3 - Td or Tdap) 02/11/2024   COVID-19 Vaccine (5 - 2025-26 season) 03/15/2024*   Hemoglobin A1C  08/23/2024   Yearly kidney health urinalysis for diabetes  10/16/2024   Eye exam for diabetics  12/23/2024   Yearly kidney function blood test for diabetes  02/22/2025   Complete foot exam   02/22/2025   Medicare Annual Wellness Visit  03/05/2025   Colon Cancer Screening  07/20/2025   Breast Cancer Screening  11/26/2025   Pneumococcal Vaccine for age over 53  Completed   Flu Shot  Completed   Osteoporosis screening with Bone Density Scan  Completed   Hepatitis C Screening  Completed   Zoster (Shingles) Vaccine  Completed   Meningitis B Vaccine  Aged Out  *Topic was postponed. The date shown is not the original due date.       03/05/2024    8:19 AM  Advanced Directives  Does Patient Have a Medical Advance Directive? No  Would patient like information on creating a medical advance directive? No - Patient declined    Vision: Annual vision screenings are recommended for early detection of glaucoma, cataracts, and diabetic  retinopathy. These exams can also reveal signs of chronic conditions such as diabetes and high blood pressure.  Dental: Annual dental screenings help detect early signs of oral cancer, gum disease, and other conditions linked to overall health, including heart disease and diabetes.  Please see the attached documents for additional preventive care recommendations.

## 2024-03-08 NOTE — Addendum Note (Signed)
 Addended by: TANDA ROJELIO ORN on: 03/08/2024 09:49 AM   Modules accepted: Orders, Level of Service

## 2024-03-08 NOTE — Addendum Note (Signed)
 Addended by: TANDA ROJELIO ORN on: 03/08/2024 09:51 AM   Modules accepted: Orders, Level of Service

## 2024-03-22 ENCOUNTER — Other Ambulatory Visit

## 2024-03-26 ENCOUNTER — Telehealth: Admitting: Family Medicine

## 2024-03-26 ENCOUNTER — Other Ambulatory Visit (INDEPENDENT_AMBULATORY_CARE_PROVIDER_SITE_OTHER)

## 2024-03-26 ENCOUNTER — Encounter: Payer: Self-pay | Admitting: Family Medicine

## 2024-03-26 ENCOUNTER — Other Ambulatory Visit: Payer: Self-pay

## 2024-03-26 VITALS — Temp 98.6°F | Wt 225.0 lb

## 2024-03-26 DIAGNOSIS — R350 Frequency of micturition: Secondary | ICD-10-CM | POA: Diagnosis not present

## 2024-03-26 DIAGNOSIS — N390 Urinary tract infection, site not specified: Secondary | ICD-10-CM | POA: Diagnosis not present

## 2024-03-26 DIAGNOSIS — R7401 Elevation of levels of liver transaminase levels: Secondary | ICD-10-CM | POA: Diagnosis not present

## 2024-03-26 LAB — POCT URINALYSIS DIPSTICK
Bilirubin, UA: NEGATIVE
Glucose, UA: NEGATIVE
Ketones, UA: NEGATIVE
Protein, UA: NEGATIVE
Spec Grav, UA: 1.01
Urobilinogen, UA: 0.2 U/dL
pH, UA: 6

## 2024-03-26 LAB — CBC
HCT: 38.3 % (ref 36.0–46.0)
Hemoglobin: 12.9 g/dL (ref 12.0–15.0)
MCHC: 33.7 g/dL (ref 30.0–36.0)
MCV: 97 fl (ref 78.0–100.0)
Platelets: 258 K/uL (ref 150.0–400.0)
RBC: 3.95 Mil/uL (ref 3.87–5.11)
RDW: 14.5 % (ref 11.5–15.5)
WBC: 4.6 K/uL (ref 4.0–10.5)

## 2024-03-26 LAB — HEPATIC FUNCTION PANEL
ALT: 31 U/L (ref 3–35)
AST: 32 U/L (ref 5–37)
Albumin: 3.9 g/dL (ref 3.5–5.2)
Alkaline Phosphatase: 52 U/L (ref 39–117)
Bilirubin, Direct: 0.1 mg/dL (ref 0.1–0.3)
Total Bilirubin: 0.4 mg/dL (ref 0.2–1.2)
Total Protein: 6.7 g/dL (ref 6.0–8.3)

## 2024-03-26 MED ORDER — CIPROFLOXACIN HCL 500 MG PO TABS: 500.0000 mg | ORAL_TABLET | Freq: Two times a day (BID) | ORAL | 0 refills | Status: AC

## 2024-03-26 NOTE — Progress Notes (Signed)
 "  Virtual Medical Office Visit  Patient:  Jacqueline Hawkins      Age: 69 y.o.       Sex:  female  Date:   03/26/2024  PCP:    Jordan, Betty G, MD   Today's Healthcare Provider: Heron CHRISTELLA Sharper, MD    Assessment/Plan:   Summary assessment:  Jacqueline Hawkins was seen today for dysuria.  Recurrent UTI -     Ciprofloxacin HCl; Take 1 tablet (500 mg total) by mouth 2 (two) times daily for 5 days.  Dispense: 10 tablet; Refill: 0  Urine frequency   Assessment and Plan    Recurrent urinary tract infections Recurrent urinary tract infections with four episodes in six months. Current infection confirmed by urinalysis showing blood, nitrites, and leukocytes. Previous cultures have consistently grown E. coli sensitive to ciprofloxacin. Recent antibiotics include Keflex , Macrobid , Bactrim , and doxycycline . Discussed long-term management options including vaginal estrogen and prophylactic antibiotics. Vaginal estrogen preferred due to additional benefits and reduced risk of infection. Prophylactic antibiotics considered as an alternative if hormonal therapy is contraindicated. - Prescribed ciprofloxacin 500 mg twice daily for 5 days. - Advised on potential side effects of ciprofloxacin, including nausea and diarrhea, and recommended taking with food and probiotics. - Instructed to discuss long-term management options with primary care provider or gynecologist, including vaginal estrogen or prophylactic antibiotics. - Sent prescription to pharmacy.        No follow-ups on file.   She was advised to call the office or go to ER if her condition worsens    Subjective:   Jacqueline Hawkins is a 69 y.o. female with PMH significant for: Past Medical History:  Diagnosis Date   Asthma, mild intermittent    followed by pcp   Cataract    CIN III (cervical intraepithelial neoplasia III)    Diabetes mellitus without complication (HCC)    History of diabetes mellitus    AIC's normal since gastric bypass    Hyperlipidemia    Iron deficiency anemia    MDD (major depressive disorder)    OA (osteoarthritis)    knees   OSA on CPAP 1995   (08-01-2022  per pt using cpap nightly)   followed by dr neda (pulmonary);  first dx 1995 used cpap , stopped after gastric bypass;  retested 09-04-2019  in epic , severe osa   Sleep apnea 2009   issue resolved after gastric by-pass surgery   Venous insufficiency    Wears glasses      Presenting today with: Chief Complaint  Patient presents with   Dysuria    Patient complains of pain with urination and odorous urine x5 days     She clarifies and reports that her condition: Discussed the use of AI scribe software for clinical note transcription with the patient, who gave verbal consent to proceed.  History of Present Illness   Jacqueline Hawkins is a 69 year old female who presents with symptoms of a urinary tract infection.  She has had four UTIs in the past six months. Her last urine culture in July grew E. coli and prior cultures have also grown E. coli. She was treated with Bactrim  in July, Macrobid  in August, Keflex  in October, and doxycycline  last year, but infections recurred.      She denies having any: Fever or chills          Objective/Observations  Physical Exam:  Polite and friendly Gen: NAD, resting comfortably Pulm: Normal work of breathing Neuro: Grossly  normal, moves all extremities Psych: Normal affect and thought content Problem specific physical exam findings:    No images are attached to the encounter or orders placed in the encounter.    Results: Results for orders placed or performed in visit on 03/26/24  POC Urinalysis Dipstick  Result Value Ref Range   Color, UA Yellow    Clarity, UA Clear    Glucose, UA Negative Negative   Bilirubin, UA negative    Ketones, UA negative    Spec Grav, UA 1.010 1.010 - 1.025   Blood, UA Postitve    pH, UA 6.0 5.0 - 8.0   Protein, UA Negative Negative   Urobilinogen, UA 0.2  0.2 or 1.0 E.U./dL   Nitrite, UA postitve    Leukocytes, UA Large (3+) (A) Negative   Appearance     Odor            Virtual Visit via Video   I connected with Jacqueline Hawkins on 03/26/2024 at  1:40 PM EST by a video enabled telemedicine application and verified that I am speaking with the correct person using two identifiers. The limitations of evaluation and management by telemedicine and the availability of in person appointments were discussed. The patient expressed understanding and agreed to proceed.   Percentage of appointment time on video:  100% Patient location: Home Provider location: [facility] Persons participating in the virtual visit: Myself and Patient     "

## 2024-03-26 NOTE — Addendum Note (Signed)
 Addended by: VANICE LUCIENNE PARAS on: 03/26/2024 11:24 AM   Modules accepted: Orders

## 2024-03-28 LAB — URINE CULTURE
MICRO NUMBER:: 17419597
SPECIMEN QUALITY:: ADEQUATE

## 2024-03-29 ENCOUNTER — Ambulatory Visit: Payer: Self-pay | Admitting: Family Medicine

## 2024-03-30 ENCOUNTER — Encounter: Payer: Self-pay | Admitting: Family Medicine

## 2024-03-30 ENCOUNTER — Ambulatory Visit: Payer: Self-pay | Admitting: Family Medicine

## 2024-03-31 ENCOUNTER — Other Ambulatory Visit: Payer: Self-pay | Admitting: Family Medicine

## 2024-03-31 DIAGNOSIS — F3341 Major depressive disorder, recurrent, in partial remission: Secondary | ICD-10-CM

## 2024-11-23 ENCOUNTER — Encounter: Admitting: Obstetrics and Gynecology

## 2025-03-11 ENCOUNTER — Ambulatory Visit
